# Patient Record
Sex: Male | Born: 1960
Health system: Southern US, Community
[De-identification: ages and names within clinical notes are randomized; demographics above are authoritative.]

## PROBLEM LIST (undated history)

## (undated) DIAGNOSIS — E119 Type 2 diabetes mellitus without complications: Secondary | ICD-10-CM

## (undated) DIAGNOSIS — M549 Dorsalgia, unspecified: Secondary | ICD-10-CM

## (undated) DIAGNOSIS — Z9889 Other specified postprocedural states: Secondary | ICD-10-CM

## (undated) DIAGNOSIS — E7800 Pure hypercholesterolemia, unspecified: Secondary | ICD-10-CM

## (undated) HISTORY — DX: Pure hypercholesterolemia, unspecified: E78.00

## (undated) HISTORY — PX: HERNIA REPAIR: SHX51

---
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
## 2006-05-14 ENCOUNTER — Ambulatory Visit (HOSPITAL_COMMUNITY): Admission: RE | Admit: 2006-05-14 | Discharge: 2006-05-14 | Payer: Self-pay | Admitting: Internal Medicine

## 2006-05-14 IMAGING — CR DG CHEST 1V
1 series · 1 of 1 positions shown · non-contrast
Comparison: None.

CLINICAL DATA: Biannual physical/no chest complaints.
 CHEST - 1 VIEW:

[w chest pa]
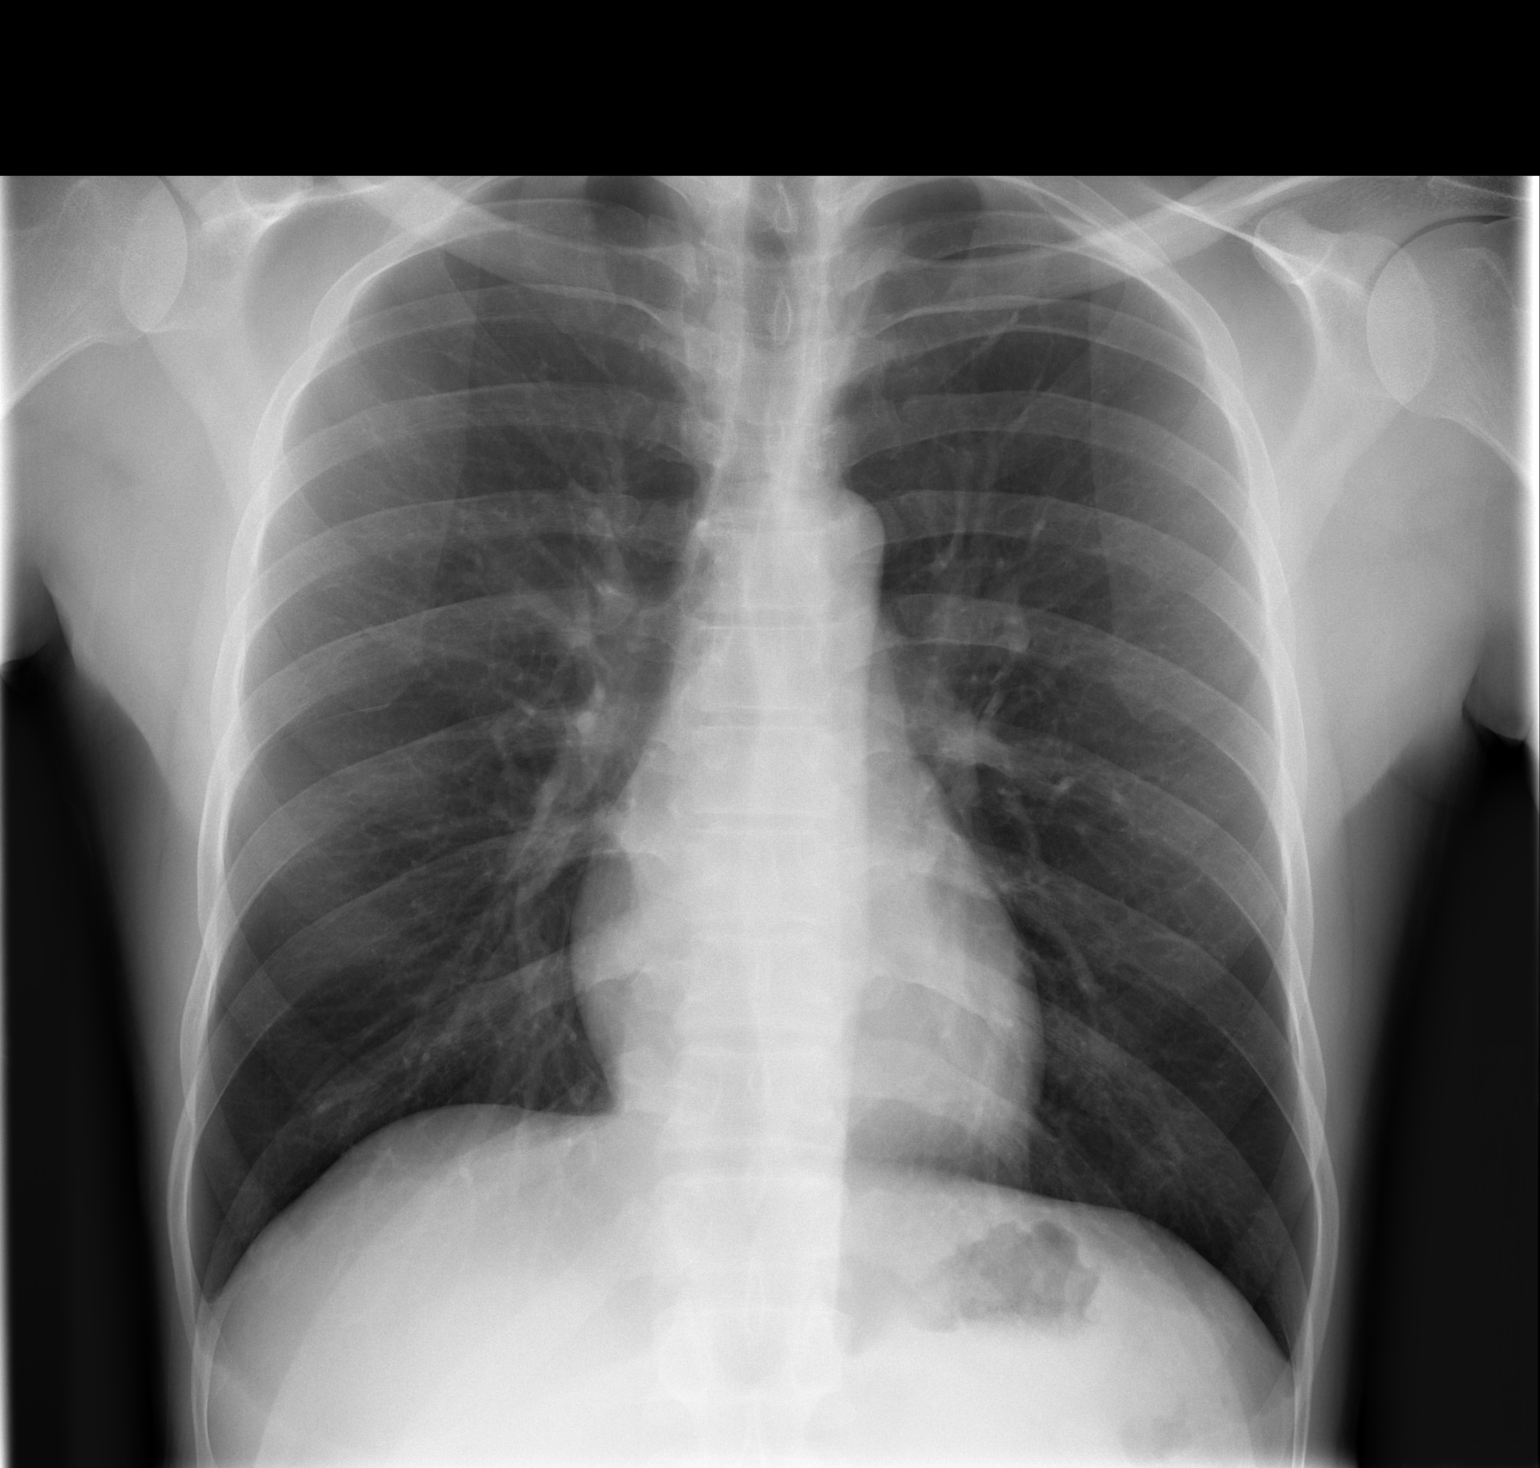

[1 of 1 positions shown; findings below may reference images not displayed]

FINDINGS: The heart and lungs are normal.  No pleural fluid or osseous lesions in 1 view.
IMPRESSION: No active disease.

## 2010-09-20 ENCOUNTER — Other Ambulatory Visit: Payer: Self-pay | Admitting: Occupational Medicine

## 2010-09-20 ENCOUNTER — Ambulatory Visit: Payer: Self-pay

## 2010-09-20 DIAGNOSIS — Z021 Encounter for pre-employment examination: Secondary | ICD-10-CM

## 2010-09-20 IMAGING — CR DG CHEST 1V
1 series · 1 of 1 positions shown · non-contrast
Comparison: [DATE]

CLINICAL DATA: Pre-employment physical examination.

CHEST - 1 VIEW

[view not recorded]
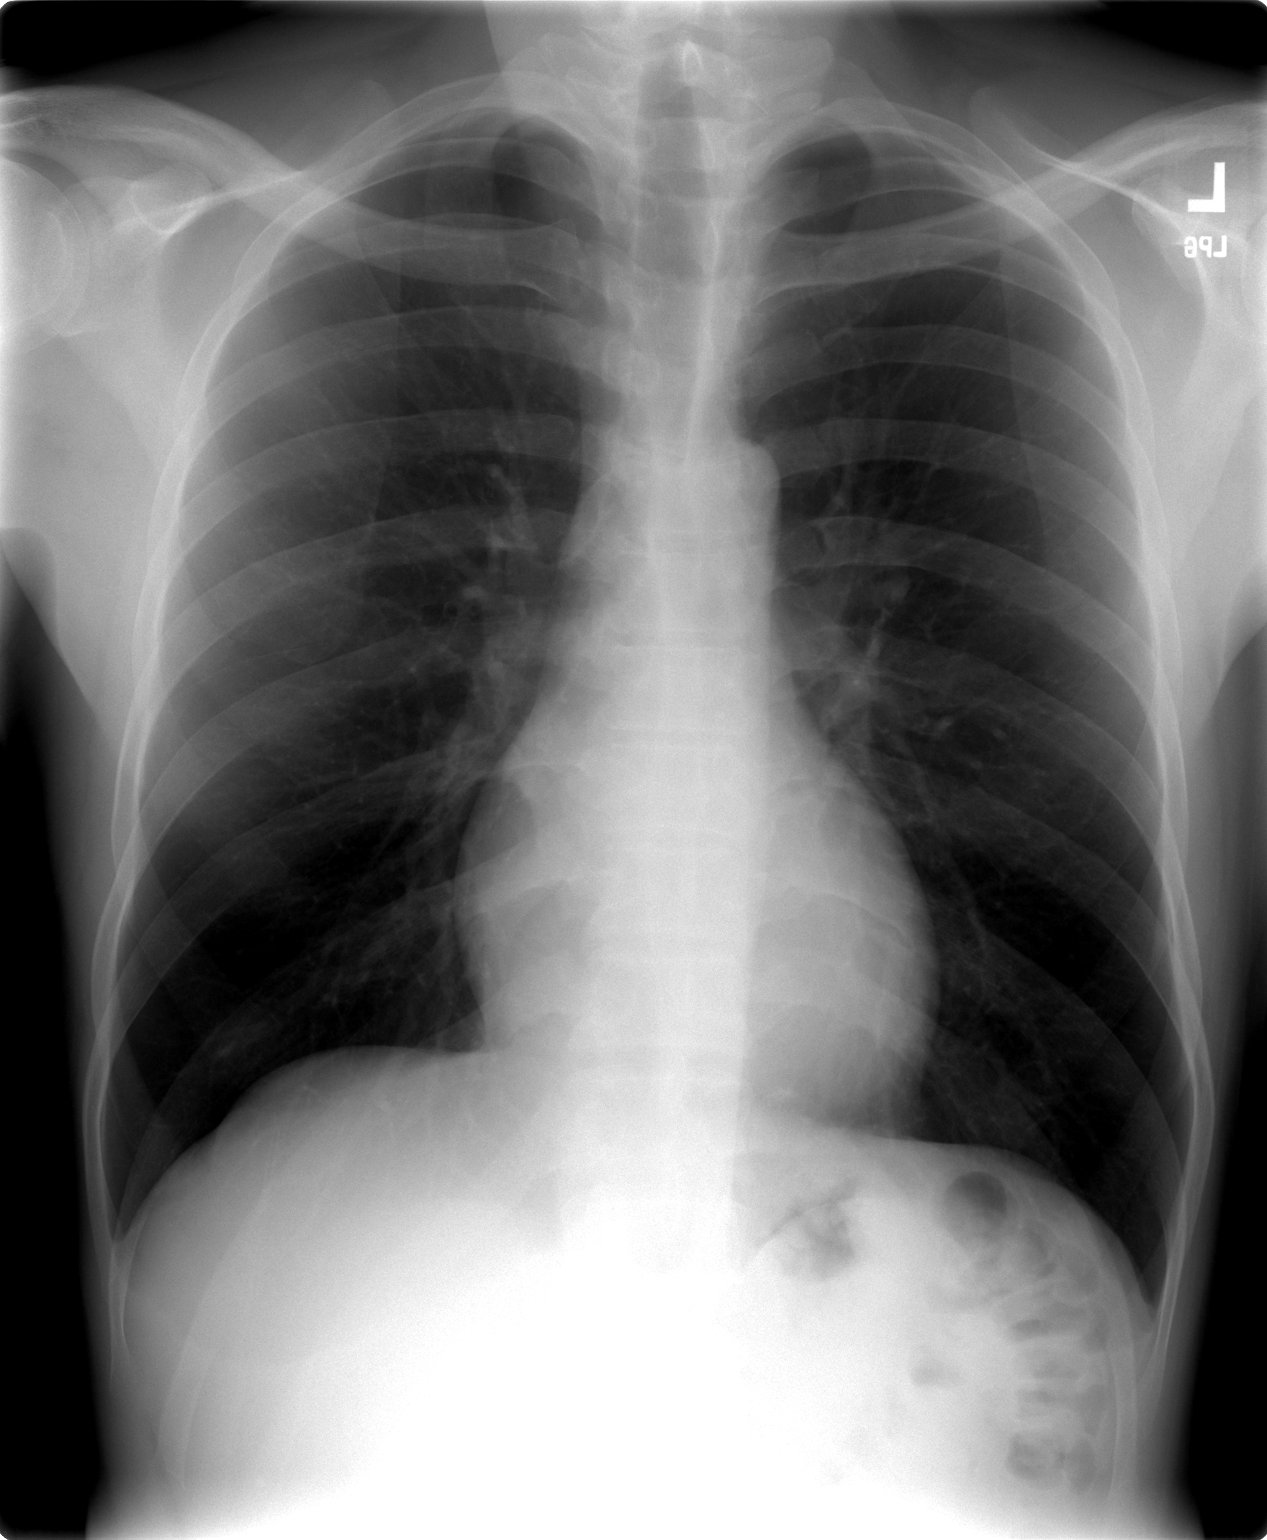

[1 of 1 positions shown; findings below may reference images not displayed]

FINDINGS: Heart size and vascularity are normal and the lungs are
clear.  No osseous abnormality.
IMPRESSION: Normal chest.

## 2012-10-29 ENCOUNTER — Ambulatory Visit (INDEPENDENT_AMBULATORY_CARE_PROVIDER_SITE_OTHER): Payer: BC Managed Care – PPO | Admitting: Family Medicine

## 2012-10-29 ENCOUNTER — Encounter: Payer: Self-pay | Admitting: Family Medicine

## 2012-10-29 VITALS — BP 122/78 | HR 76 | Ht 72.0 in | Wt 136.6 lb

## 2012-10-29 DIAGNOSIS — M549 Dorsalgia, unspecified: Secondary | ICD-10-CM

## 2012-10-29 DIAGNOSIS — G8929 Other chronic pain: Secondary | ICD-10-CM

## 2012-10-29 MED ORDER — NAPROXEN 500 MG PO TABS: 500.0000 mg | ORAL_TABLET | Freq: Two times a day (BID) | ORAL | Status: DC

## 2012-10-29 MED ORDER — HYDROCODONE-ACETAMINOPHEN 5-325 MG PO TABS: 1.0000 | ORAL_TABLET | Freq: Four times a day (QID) | ORAL | Status: DC | PRN

## 2012-10-29 MED ORDER — CHLORZOXAZONE 500 MG PO TABS: 500.0000 mg | ORAL_TABLET | Freq: Four times a day (QID) | ORAL | Status: DC | PRN

## 2012-10-29 NOTE — Progress Notes (Signed)
  Subjective:    Patient ID: Daniel Burton, male    DOB: 1960-10-22, 52 y.o.   MRN: [id number]  Back Pain This is a recurrent problem. The current episode started more than 1 year ago. The problem occurs intermittently. The quality of the pain is described as shooting. The pain radiates to the right thigh and left thigh. He has tried bed rest and heat for the symptoms. The treatment provided mild relief.      Review of Systems  Musculoskeletal: Positive for back pain.   back pain is mainly in the lower back does not radiate down the legs he states the pain is so severe in his back that grabs him causing his legs the weekend this is been going on ever since today when he picked up a heavy canister at work. He states he's had some intermittent low back pain for months. This is different than his current pain no loss of bowel or bladder control.     Objective:   Physical Exam Lungs are clear hearts regular low back moderate tenderness negative straight leg raise somewhat limited flexion but otherwise good range of motion       Assessment & Plan:  Lumbar pain-I. feel that more than likely he strained a ligament this should gradually get better with stretching as well as anti-inflammatories muscle relaxers as needed for spasms plus short-term for pain medication.

## 2012-10-29 NOTE — Patient Instructions (Addendum)
Naprosyn is a anti-inflamatory , use this twice a day for next 2 to 3 weeks  Hydrocodone for pain but only for home use for short time use  Parafon forte one as needed for muscle spasms for home use  Back Exercises Back exercises help treat and prevent back injuries. The goal of back exercises is to increase the strength of your abdominal and back muscles and the flexibility of your back. These exercises should be started when you no longer have back pain. Back exercises include:  Pelvic Tilt. Lie on your back with your knees bent. Tilt your pelvis until the lower part of your back is against the floor. Hold this position 5 to 10 sec and repeat 5 to 10 times.  Knee to Chest. Pull first 1 knee up against your chest and hold for 20 to 30 seconds, repeat this with the other knee, and then both knees. This may be done with the other leg straight or bent, whichever feels better.  Sit-Ups or Curl-Ups. Bend your knees 90 degrees. Start with tilting your pelvis, and do a partial, slow sit-up, lifting your trunk only 30 to 45 degrees off the floor. Take at least 2 to 3 seconds for each sit-up. Do not do sit-ups with your knees out straight. If partial sit-ups are difficult, simply do the above but with only tightening your abdominal muscles and holding it as directed.  Hip-Lift. Lie on your back with your knees flexed 90 degrees. Push down with your feet and shoulders as you raise your hips a couple inches off the floor; hold for 10 seconds, repeat 5 to 10 times.  Back arches. Lie on your stomach, propping yourself up on bent elbows. Slowly press on your hands, causing an arch in your low back. Repeat 3 to 5 times. Any initial stiffness and discomfort should lessen with repetition over time.  Shoulder-Lifts. Lie face down with arms beside your body. Keep hips and torso pressed to floor as you slowly lift your head and shoulders off the floor. Do not overdo your exercises, especially in the beginning.  Exercises may cause you some mild back discomfort which lasts for a few minutes; however, if the pain is more severe, or lasts for more than 15 minutes, do not continue exercises until you see your caregiver. Improvement with exercise therapy for back problems is slow.  See your caregivers for assistance with developing a proper back exercise program. Document Released: 04/20/2004 Document Revised: 06/05/2011 Document Reviewed: 01/12/2011 [facility] Patient Information 2014 [place], [place].

## 2012-10-30 ENCOUNTER — Telehealth: Payer: Self-pay | Admitting: Family Medicine

## 2012-10-30 NOTE — Progress Notes (Signed)
RX called in on voicemail 

## 2012-10-30 NOTE — Telephone Encounter (Signed)
Patient needs his muscle relaxer called in that did not go through yesterday    [facility] [place]

## 2012-10-30 NOTE — Telephone Encounter (Signed)
Rx was called into the pharmacy. Patient notified.

## 2012-11-04 ENCOUNTER — Telehealth: Payer: Self-pay | Admitting: Family Medicine

## 2012-11-04 ENCOUNTER — Encounter: Payer: Self-pay | Admitting: Family Medicine

## 2012-11-04 NOTE — Telephone Encounter (Signed)
Ok will ntbs again if persists into next wk

## 2012-11-04 NOTE — Telephone Encounter (Signed)
Patient needs another work note for this week.He was seen on 8/5 with backpain. He states unable to work because still in pain.

## 2012-11-08 ENCOUNTER — Ambulatory Visit: Payer: BC Managed Care – PPO | Admitting: Family Medicine

## 2012-11-11 ENCOUNTER — Ambulatory Visit: Payer: BC Managed Care – PPO | Admitting: Family Medicine

## 2012-11-11 ENCOUNTER — Telehealth: Payer: Self-pay | Admitting: Family Medicine

## 2012-11-11 MED ORDER — HYDROCODONE-ACETAMINOPHEN 5-325 MG PO TABS: 1.0000 | ORAL_TABLET | Freq: Four times a day (QID) | ORAL | Status: DC | PRN

## 2012-11-11 NOTE — Telephone Encounter (Signed)
Last filled 10/29/12 for #30

## 2012-11-11 NOTE — Telephone Encounter (Signed)
He may have one refill, if needing further pain medication he needs to come back for evaluation.

## 2012-11-11 NOTE — Telephone Encounter (Signed)
Patient would like a refill of his hydrocodone for back pain-He had appt today but cancelled it due to work

## 2012-11-11 NOTE — Telephone Encounter (Signed)
Rx printed and faxed to [facility] [place] . Patient notified that he will need office visit before ant further refills.

## 2012-11-15 ENCOUNTER — Ambulatory Visit (INDEPENDENT_AMBULATORY_CARE_PROVIDER_SITE_OTHER): Payer: BC Managed Care – PPO | Admitting: Family Medicine

## 2012-11-15 ENCOUNTER — Encounter: Payer: Self-pay | Admitting: Family Medicine

## 2012-11-15 VITALS — BP 124/84 | Ht 72.0 in | Wt 145.0 lb

## 2012-11-15 DIAGNOSIS — M549 Dorsalgia, unspecified: Secondary | ICD-10-CM

## 2012-11-15 NOTE — Progress Notes (Signed)
  Subjective:    Patient ID: Daniel Burton, male    DOB: Dec 27, 1960, 52 y.o.   MRN: [id number]  HPI Comments: Needs a refill on pain meds. Back pain worst this Saturday.   Back Pain This is a chronic problem. The problem has been gradually worsening since onset. The pain is the same all the time. Associated symptoms include weight loss. He has tried NSAIDs and muscle relaxant for the symptoms. The treatment provided mild relief.   Pain got worse this past weekend, not feeling one hundred per cent  On light duty occasionally having discomfort intermittently.  Medication ran out and he feels like he needs more.     Review of Systems  Constitutional: Positive for weight loss.  Musculoskeletal: Positive for back pain.   ROS otherwise negative     Objective:   Physical Exam  Alert no major distress except when moving. Lungs clear. Heart regular rate and rhythm. Left lower lumbar region tender to palpation. Negative straight leg raise      Assessment & Plan:  Impression persistent lumbar strain. Plan antispasm medicine pain medication and anti-inflammatory medicine represcribed. Local measures discussed. Another week of light duty expect gradual resolution. Screening physicals still encouraged

## 2012-11-15 NOTE — Patient Instructions (Signed)
This is a strain of low back muscles and ligaments  This should slowly improve and get back to baseline  In the meantime, ( next two weeks), light duty would be helpful in his recovery.       Lumbosacral Strain Lumbosacral strain is one of the most common causes of back pain. There are many causes of back pain. Most are not serious conditions. CAUSES  Your backbone (spinal column) is made up of 24 main vertebral bodies, the sacrum, and the coccyx. These are held together by muscles and tough, fibrous tissue (ligaments). Nerve roots pass through the openings between the vertebrae. A sudden move or injury to the back may cause injury to, or pressure on, these nerves. This may result in localized back pain or pain movement (radiation) into the buttocks, down the leg, and into the foot. Sharp, shooting pain from the buttock down the back of the leg (sciatica) is frequently associated with a ruptured (herniated) disk. Pain may be caused by muscle spasm alone. Your caregiver can often find the cause of your pain by the details of your symptoms and an exam. In some cases, you may need tests (such as X-rays). Your caregiver will work with you to decide if any tests are needed based on your specific exam. HOME CARE INSTRUCTIONS   Avoid an underactive lifestyle. Active exercise, as directed by your caregiver, is your greatest weapon against back pain.  Avoid hard physical activities (tennis, racquetball, waterskiing) if you are not in proper physical condition for it. This may aggravate or create problems.  If you have a back problem, avoid sports requiring sudden body movements. Swimming and walking are generally safer activities.  Maintain good posture.  Avoid becoming overweight (obese).  Use bed rest for only the most extreme, sudden (acute) episode. Your caregiver will help you determine how much bed rest is necessary.  For acute conditions, you may put ice on the injured area.  Put ice in  a plastic bag.  Place a towel between your skin and the bag.  Leave the ice on for 15-20 minutes at a time, every 2 hours, or as needed.  After you are improved and more active, it may help to apply heat for 30 minutes before activities. See your caregiver if you are having pain that lasts longer than expected. Your caregiver can advise appropriate exercises or therapy if needed. With conditioning, most back problems can be avoided. SEEK IMMEDIATE MEDICAL CARE IF:   You have numbness, tingling, weakness, or problems with the use of your arms or legs.  You experience severe back pain not relieved with medicines.  There is a change in bowel or bladder control.  You have increasing pain in any area of the body, including your belly (abdomen).  You notice shortness of breath, dizziness, or feel faint.  You feel sick to your stomach (nauseous), are throwing up (vomiting), or become sweaty.  You notice discoloration of your toes or legs, or your feet get very cold.  Your back pain is getting worse.  You have a fever. MAKE SURE YOU:   Understand these instructions.  Will watch your condition.  Will get help right away if you are not doing well or get worse. Document Released: 12/21/2004 Document Revised: 06/05/2011 Document Reviewed: 06/12/2008 [facility] Patient Information 2014 [place], [place]. Back Exercises Back exercises help treat and prevent back injuries. The goal of back exercises is to increase the strength of your abdominal and back muscles and the flexibility of  your back. These exercises should be started when you no longer have back pain. Back exercises include:  Pelvic Tilt. Lie on your back with your knees bent. Tilt your pelvis until the lower part of your back is against the floor. Hold this position 5 to 10 sec and repeat 5 to 10 times.  Knee to Chest. Pull first 1 knee up against your chest and hold for 20 to 30 seconds, repeat this with the other knee, and then  both knees. This may be done with the other leg straight or bent, whichever feels better.  Sit-Ups or Curl-Ups. Bend your knees 90 degrees. Start with tilting your pelvis, and do a partial, slow sit-up, lifting your trunk only 30 to 45 degrees off the floor. Take at least 2 to 3 seconds for each sit-up. Do not do sit-ups with your knees out straight. If partial sit-ups are difficult, simply do the above but with only tightening your abdominal muscles and holding it as directed.  Hip-Lift. Lie on your back with your knees flexed 90 degrees. Push down with your feet and shoulders as you raise your hips a couple inches off the floor; hold for 10 seconds, repeat 5 to 10 times.  Back arches. Lie on your stomach, propping yourself up on bent elbows. Slowly press on your hands, causing an arch in your low back. Repeat 3 to 5 times. Any initial stiffness and discomfort should lessen with repetition over time.  Shoulder-Lifts. Lie face down with arms beside your body. Keep hips and torso pressed to floor as you slowly lift your head and shoulders off the floor. Do not overdo your exercises, especially in the beginning. Exercises may cause you some mild back discomfort which lasts for a few minutes; however, if the pain is more severe, or lasts for more than 15 minutes, do not continue exercises until you see your caregiver. Improvement with exercise therapy for back problems is slow.  See your caregivers for assistance with developing a proper back exercise program. Document Released: 04/20/2004 Document Revised: 06/05/2011 Document Reviewed: 01/12/2011 [facility] Patient Information 2014 [place], [place].

## 2012-11-21 ENCOUNTER — Encounter (HOSPITAL_COMMUNITY): Payer: Self-pay | Admitting: Emergency Medicine

## 2012-11-21 ENCOUNTER — Emergency Department (HOSPITAL_COMMUNITY): Payer: BC Managed Care – PPO

## 2012-11-21 ENCOUNTER — Emergency Department (HOSPITAL_COMMUNITY)
Admission: EM | Admit: 2012-11-21 | Discharge: 2012-11-21 | Disposition: A | Discharge status: Home / Self Care | Payer: BC Managed Care – PPO | Attending: Emergency Medicine | Admitting: Emergency Medicine

## 2012-11-21 DIAGNOSIS — Y939 Activity, unspecified: Secondary | ICD-10-CM | POA: Insufficient documentation

## 2012-11-21 DIAGNOSIS — Z791 Long term (current) use of non-steroidal anti-inflammatories (NSAID): Secondary | ICD-10-CM | POA: Insufficient documentation

## 2012-11-21 DIAGNOSIS — Y929 Unspecified place or not applicable: Secondary | ICD-10-CM | POA: Insufficient documentation

## 2012-11-21 DIAGNOSIS — X500XXA Overexertion from strenuous movement or load, initial encounter: Secondary | ICD-10-CM | POA: Insufficient documentation

## 2012-11-21 DIAGNOSIS — S39012A Strain of muscle, fascia and tendon of lower back, initial encounter: Secondary | ICD-10-CM

## 2012-11-21 DIAGNOSIS — S335XXA Sprain of ligaments of lumbar spine, initial encounter: Secondary | ICD-10-CM | POA: Insufficient documentation

## 2012-11-21 DIAGNOSIS — F17200 Nicotine dependence, unspecified, uncomplicated: Secondary | ICD-10-CM | POA: Insufficient documentation

## 2012-11-21 IMAGING — CR DG LUMBAR SPINE COMPLETE 4+V
5 series · 5 of 5 positions shown · non-contrast
Comparison: None.

CLINICAL DATA: The patient states he hurt his back 3 weeks ago at
work.  Low back pain.

LUMBAR SPINE - COMPLETE 4+ VIEW

[view not recorded (1 of 5)]
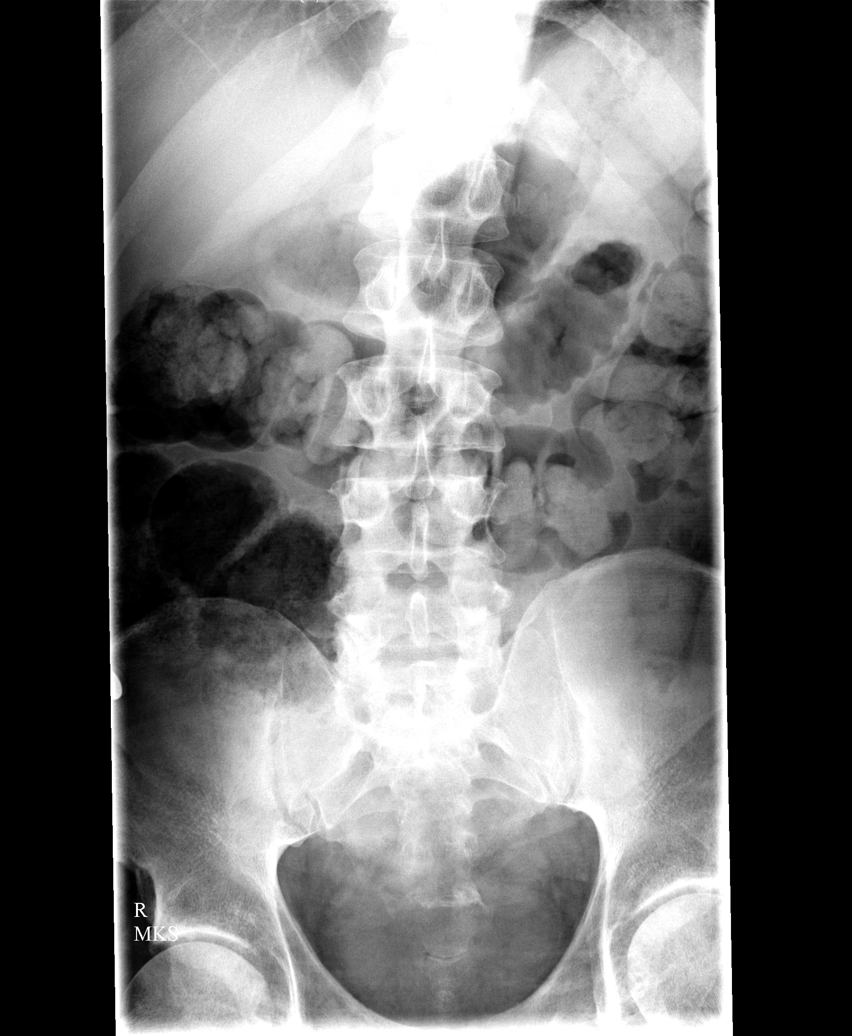

[view not recorded (2 of 5)]
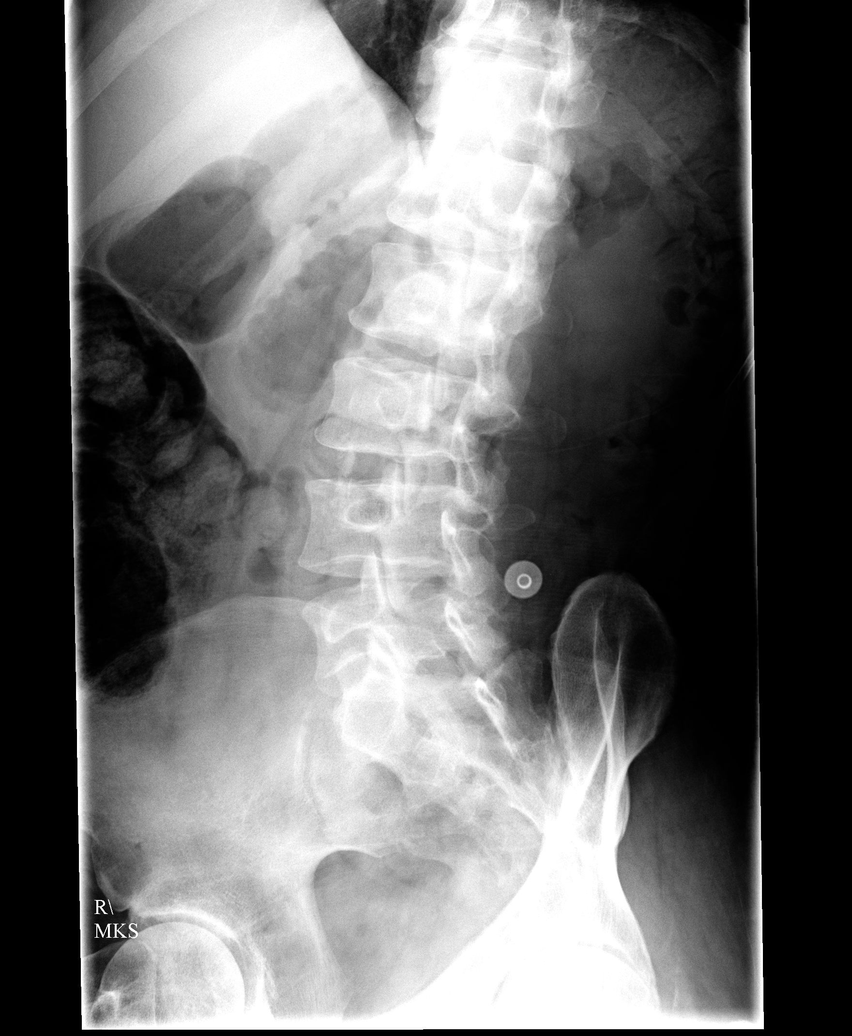

[view not recorded (3 of 5)]
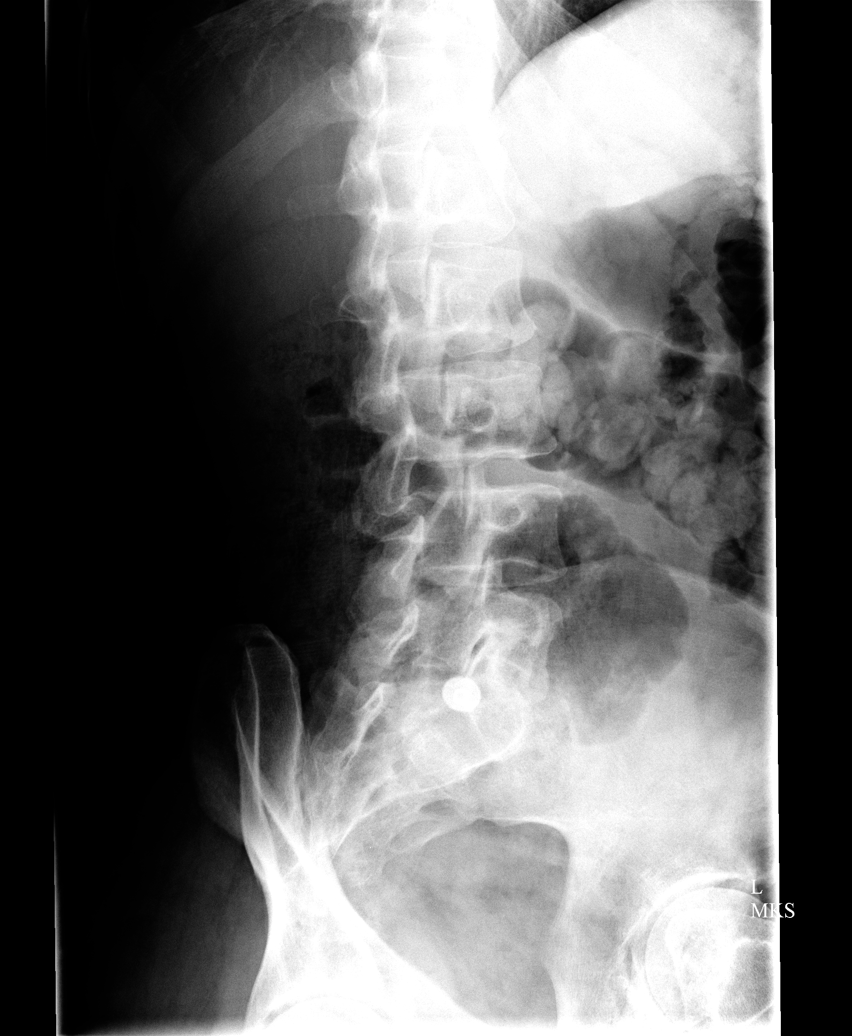

[view not recorded (4 of 5)]
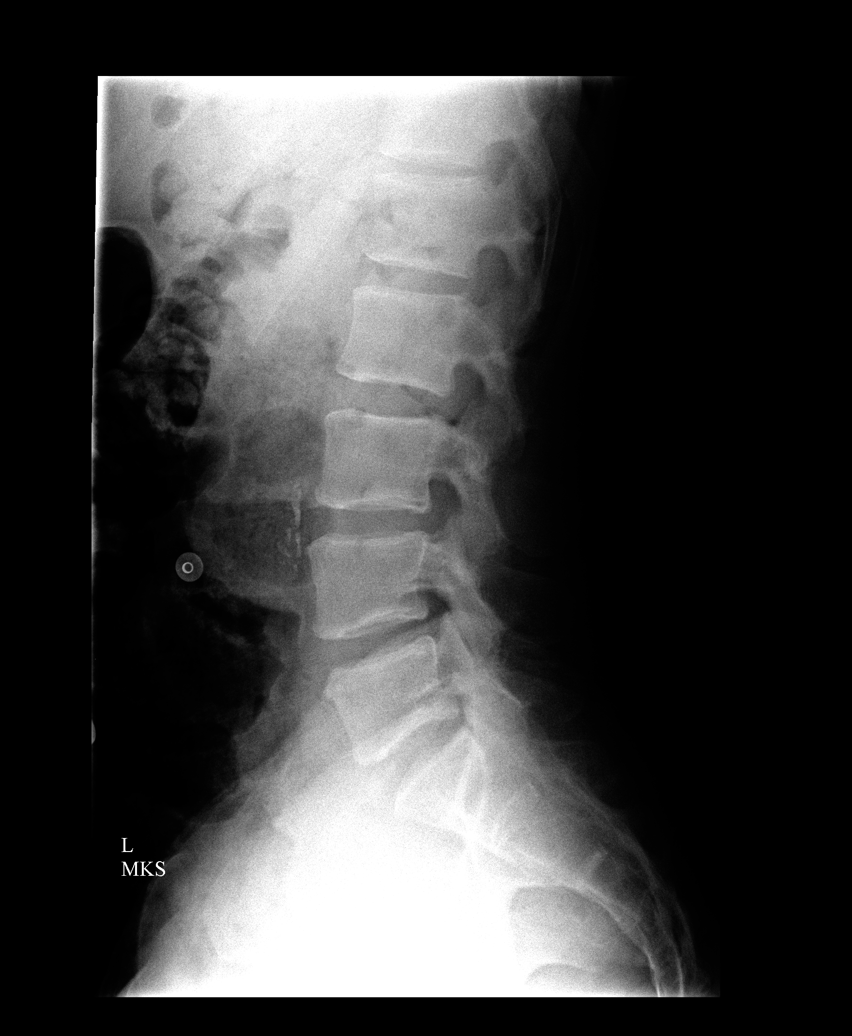

[view not recorded (5 of 5)]
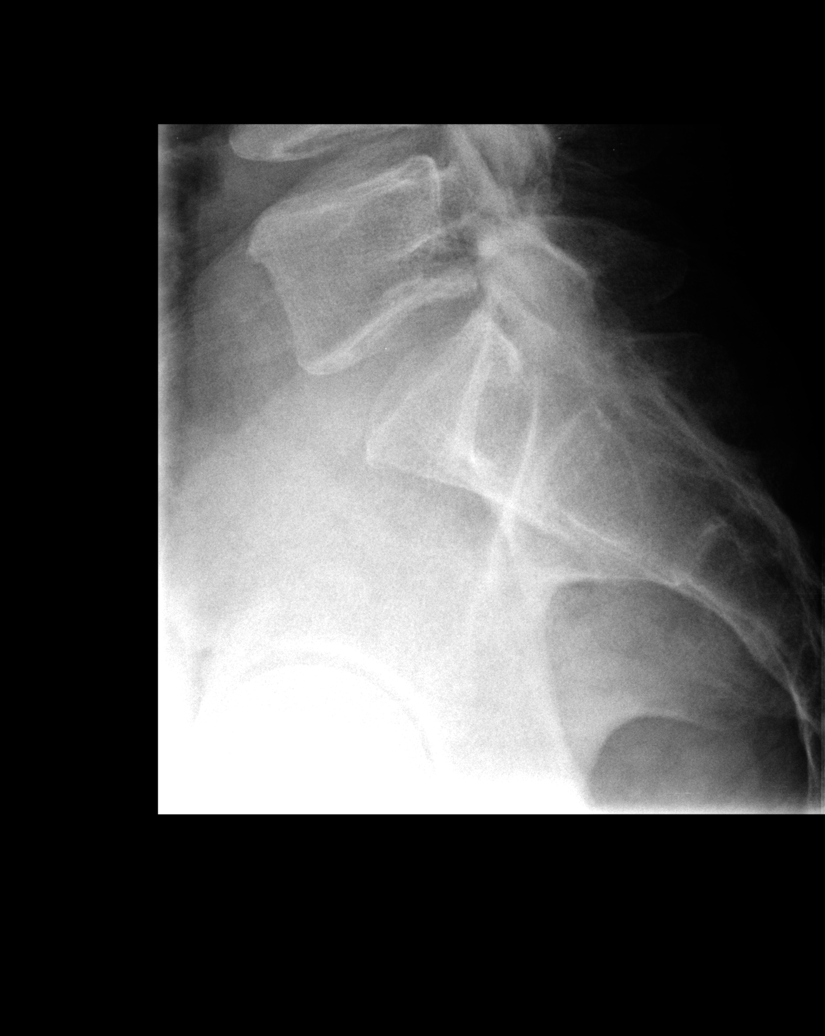

[5 of 5 positions shown; findings below may reference images not displayed]

FINDINGS: There is normal vertebral body stature alignment.  No
fracture.

The disc spaces and facet joints well preserved.

The soft tissues are unremarkable.

Minimal endplate osteophytes noted at L3, L4-L5.  No other
degenerative change.
IMPRESSION: Minimal degenerative change.  No other abnormalities.

## 2012-11-21 MED ORDER — CYCLOBENZAPRINE HCL 10 MG PO TABS: 10.0000 mg | ORAL_TABLET | Freq: Two times a day (BID) | ORAL | Status: DC | PRN

## 2012-11-21 MED ORDER — HYDROCODONE-ACETAMINOPHEN 5-325 MG PO TABS
1.0000 | ORAL_TABLET | Freq: Once | ORAL | Status: AC
  Administered 2012-11-21: 1 via ORAL
  Filled 2012-11-21: qty 1

## 2012-11-21 MED ORDER — HYDROCODONE-ACETAMINOPHEN 5-325 MG PO TABS: 1.0000 | ORAL_TABLET | Freq: Four times a day (QID) | ORAL | Status: DC | PRN

## 2012-11-21 NOTE — ED Notes (Signed)
Patient given discharge instruction, verbalized understand. IV removed, band aid applied. Patient in wheelchair out of the department to meet wife.

## 2012-11-21 NOTE — ED Notes (Signed)
Patient c/o lower back pain x3 weeks. Patient seen Dr Gerda Diss and was told patient pulled ligaments and company doctor told him pulled muscle was put on light duty. Patient at work today went to change clothes and had sudden burning pain in back. Patient unable to bear weight on left leg. Per EMS patient was in severe pain upon arrival to scene given 4mg  IV morphine by EMS. Now rates pain at a 2/10.

## 2012-11-21 NOTE — ED Provider Notes (Signed)
CSN: [id number]     Arrival date & time 11/21/12  1915 History   First MD Initiated Contact with Patient 11/21/12 1925     Chief Complaint  Patient presents with  . Back Pain   (Consider location/radiation/quality/duration/timing/severity/associated sxs/prior Treatment) Patient is a 52 y.o. male presenting with back pain. The history is provided by the patient and the EMS personnel.  Back Pain Associated symptoms: no abdominal pain, no chest pain, no dysuria, no fever, no headaches, no numbness and no weakness    patient brought in by EMS from work. Patient is getting rate to work his back suddenly created severe pain on the left lumbar area radiating down into his left thigh. Pain was quite severe EMS gave him 4 mg of IV morphine in route and pain came down to 2/10. Patient does have been having problems with his low back for 3 weeks has seen his primary care Dr. Dr. Kennis Carina. And also seen by the company doctor. Patient has not had x-rays or MRI. Patient originally injured the back at work while lifting a heavy drum. Patient denies any numbness or weakness to the feet also denies any incontinence. No history of prior back problems. Patient's states the pain now is 0/10 at its worst it was 10 out of 10 it was sharp in nature and made worse by moving.  History reviewed. No pertinent past medical history. History reviewed. No pertinent past surgical history. No family history on file. History  Substance Use Topics  . Smoking status: Light Tobacco Smoker    Types: Cigarettes  . Smokeless tobacco: Not on file  . Alcohol Use: No    Review of Systems  Constitutional: Negative for fever.  HENT: Negative for congestion.   Respiratory: Negative for shortness of breath.   Cardiovascular: Negative for chest pain.  Gastrointestinal: Negative for abdominal pain.  Genitourinary: Negative for dysuria and difficulty urinating.  Musculoskeletal: Positive for back pain.  Neurological: Negative for  weakness, numbness and headaches.  Hematological: Does not bruise/bleed easily.  Psychiatric/Behavioral: Negative for confusion.    Allergies  Review of patient's allergies indicates no known allergies.  Home Medications   Current Outpatient Rx  Name  Route  Sig  Dispense  Refill  . chlorzoxazone (PARAFON FORTE DSC) 500 MG tablet   Oral   Take 1 tablet (500 mg total) by mouth 4 (four) times daily as needed for muscle spasms.   30 tablet   0     For home use   . HYDROcodone-acetaminophen (NORCO/VICODIN) 5-325 MG per tablet   Oral   Take 1 tablet by mouth every 6 (six) hours as needed for pain.   30 tablet   0   . naproxen (NAPROSYN) 500 MG tablet   Oral   Take 1 tablet (500 mg total) by mouth 2 (two) times daily with a meal.   60 tablet   2   . cyclobenzaprine (FLEXERIL) 10 MG tablet   Oral   Take 1 tablet (10 mg total) by mouth 2 (two) times daily as needed for muscle spasms.   20 tablet   0   . HYDROcodone-acetaminophen (NORCO/VICODIN) 5-325 MG per tablet   Oral   Take 1-2 tablets by mouth every 6 (six) hours as needed for pain.   20 tablet   0    BP 142/89  Pulse 76  Temp(Src) 98.5 F (36.9 C) (Oral)  Resp 20  SpO2 98% Physical Exam  Nursing note and vitals reviewed. Constitutional: He is  oriented to person, place, and time. He appears well-developed and well-nourished. No distress.  HENT:  Head: Normocephalic and atraumatic.  Mouth/Throat: Oropharynx is clear and moist.  Eyes: Conjunctivae and EOM are normal. Pupils are equal, round, and reactive to light.  Neck: Normal range of motion.  Cardiovascular: Normal rate, regular rhythm and normal heart sounds.   Pulmonary/Chest: Effort normal and breath sounds normal. No respiratory distress.  Abdominal: Soft. Bowel sounds are normal. There is no tenderness.  Musculoskeletal: Normal range of motion.  Back nontender to palpation. Dorsalis pedis pulse right and left 2+. No numbness no weakness.   Neurological: He is alert and oriented to person, place, and time. No cranial nerve deficit. He exhibits normal muscle tone. Coordination normal.  Skin: Skin is warm and dry.    ED Course  Procedures (including critical care time) Labs Review Labs Reviewed - No data to display Imaging Review Dg Lumbar Spine Complete  11/21/2012   *RADIOLOGY REPORT*  Clinical Data: The patient states he hurt his back 3 weeks ago at work.  Low back pain.  LUMBAR SPINE - COMPLETE 4+ VIEW  Comparison: None.  Findings: There is normal vertebral body stature alignment.  No fracture.  The disc spaces and facet joints well preserved.  The soft tissues are unremarkable.  Minimal endplate osteophytes noted at L3, L4-L5.  No other degenerative change.  IMPRESSION: Minimal degenerative change.  No other abnormalities.   Original Report Authenticated By: Amie Portland, M.D.    MDM   1. Lumbar strain, initial encounter    X-rays of the back without any acute findings. There is some degenerative changes in the lower lumbar area that may be attributable to the patient's pain patient injured his back while on the job. Followup with spine specialist here locally provided. Next B. MRI for patient. Patient has pain medication at home was given a supplemental prescription for that as he needs it. And a work note provided.  Patient here today has no evidence of sciatica in no acute focal neuro deficits.  Shelda Jakes, MD 11/21/12 [phone]

## 2012-11-28 ENCOUNTER — Encounter: Payer: Self-pay | Admitting: Family Medicine

## 2012-11-28 ENCOUNTER — Ambulatory Visit (INDEPENDENT_AMBULATORY_CARE_PROVIDER_SITE_OTHER): Payer: BC Managed Care – PPO | Admitting: Family Medicine

## 2012-11-28 VITALS — BP 110/74 | Ht 72.0 in | Wt 143.0 lb

## 2012-11-28 DIAGNOSIS — M549 Dorsalgia, unspecified: Secondary | ICD-10-CM

## 2012-11-28 DIAGNOSIS — G8929 Other chronic pain: Secondary | ICD-10-CM

## 2012-11-28 NOTE — Patient Instructions (Addendum)
Back Exercises Back exercises help treat and prevent back injuries. The goal of back exercises is to increase the strength of your abdominal and back muscles and the flexibility of your back. These exercises should be started when you no longer have back pain. Back exercises include:  Pelvic Tilt. Lie on your back with your knees bent. Tilt your pelvis until the lower part of your back is against the floor. Hold this position 5 to 10 sec and repeat 5 to 10 times.  Knee to Chest. Pull first 1 knee up against your chest and hold for 20 to 30 seconds, repeat this with the other knee, and then both knees. This may be done with the other leg straight or bent, whichever feels better.  Sit-Ups or Curl-Ups. Bend your knees 90 degrees. Start with tilting your pelvis, and do a partial, slow sit-up, lifting your trunk only 30 to 45 degrees off the floor. Take at least 2 to 3 seconds for each sit-up. Do not do sit-ups with your knees out straight. If partial sit-ups are difficult, simply do the above but with only tightening your abdominal muscles and holding it as directed.  Hip-Lift. Lie on your back with your knees flexed 90 degrees. Push down with your feet and shoulders as you raise your hips a couple inches off the floor; hold for 10 seconds, repeat 5 to 10 times.  Back arches. Lie on your stomach, propping yourself up on bent elbows. Slowly press on your hands, causing an arch in your low back. Repeat 3 to 5 times. Any initial stiffness and discomfort should lessen with repetition over time.  Shoulder-Lifts. Lie face down with arms beside your body. Keep hips and torso pressed to floor as you slowly lift your head and shoulders off the floor. Do not overdo your exercises, especially in the beginning. Exercises may cause you some mild back discomfort which lasts for a few minutes; however, if the pain is more severe, or lasts for more than 15 minutes, do not continue exercises until you see your caregiver.  Improvement with exercise therapy for back problems is slow.  See your caregivers for assistance with developing a proper back exercise program. Document Released: 04/20/2004 Document Revised: 06/05/2011 Document Reviewed: 01/12/2011 [employer] Patient Information 2014 [employer].  

## 2012-11-28 NOTE — Progress Notes (Signed)
  Subjective:    Patient ID: Daniel Burton, male    DOB: 1960/05/05, 52 y.o.   MRN: [id number]  HPIFollow up on back pain. Went to [facility] last Thursday for back pain. And patient actually had to go to the emergency room via EMS. They did x-rays there. It showed degenerative changes. The pain was primarily in the left lower lumbar region.  The patient range see been injection. Stated that this improved his pain considerably.   Patient notes some numbness in his left side. Comes and goes. No weakness.  Currently not working due to the pain.   Review of Systems No change in urinary or bowel habits no weakness no abdominal pain.    Objective:   Physical Exam  Alert no acute distress. Lungs clear. Heart regular in rhythm. Left lower lumbar region tender to palpation. Negative straight leg raise pulses sensation strength all intact.      Assessment & Plan:  Impression severe lumbar strain discussed at length. Patient wonders whether he may need surgery. I do not think so. He wonders if he needs a scan I also do not think so. Incredibly we spent probably 7 or 8 minutes talking just about his work excuse alone. At first he stated that he wanted to go back to work sooner than I recommended. Then he stated he thought he needed to be out longer. We finally decided on writing him out of work through next week in. Remarkably 25 minutes spent in this evaluation mostly in just an attempt at communication.

## 2013-01-20 ENCOUNTER — Telehealth: Payer: Self-pay | Admitting: Family Medicine

## 2013-01-20 NOTE — Telephone Encounter (Signed)
Patient states He did not pick HYDROcodone-acetaminophen (NORCO/VICODIN) 5-325 MG per tablet up before 7 days therefore now he needs a new prescription.  Also would like a print of what to eat and not to eat for high chol

## 2013-01-20 NOTE — Telephone Encounter (Signed)
Sorry, what does this mean???

## 2013-01-20 NOTE — Telephone Encounter (Signed)
Last office visit 11-28-12

## 2013-01-21 MED ORDER — HYDROCODONE-ACETAMINOPHEN 5-325 MG PO TABS: 1.0000 | ORAL_TABLET | Freq: Four times a day (QID) | ORAL | Status: DC | PRN

## 2013-01-21 NOTE — Telephone Encounter (Signed)
Ok let's do again plus give low chol diet info.

## 2013-01-21 NOTE — Telephone Encounter (Signed)
Rx printed and left up front with cholesterol info for patient pick up. Patient notified.

## 2013-01-21 NOTE — Telephone Encounter (Signed)
  Patient states that the last rx you gave him he turned into pharmacy but didn't go to pick it up for over a week. When he went to pick it up the pharmacy told him since it is a class 2 narc after 7 days they shred the rx and he would need a new one to get med

## 2015-06-04 ENCOUNTER — Encounter: Payer: Self-pay | Admitting: Family Medicine

## 2015-06-04 ENCOUNTER — Ambulatory Visit (INDEPENDENT_AMBULATORY_CARE_PROVIDER_SITE_OTHER): Payer: BLUE CROSS/BLUE SHIELD | Admitting: Family Medicine

## 2015-06-04 VITALS — BP 120/80 | Temp 99.6°F | Ht 72.0 in | Wt 133.1 lb

## 2015-06-04 DIAGNOSIS — J111 Influenza due to unidentified influenza virus with other respiratory manifestations: Secondary | ICD-10-CM | POA: Diagnosis not present

## 2015-06-04 MED ORDER — OSELTAMIVIR PHOSPHATE 75 MG PO CAPS: 75.0000 mg | ORAL_CAPSULE | Freq: Two times a day (BID) | ORAL | Status: DC

## 2015-06-04 MED ORDER — HYDROCODONE-HOMATROPINE 5-1.5 MG/5ML PO SYRP: 5.0000 mL | ORAL_SOLUTION | Freq: Four times a day (QID) | ORAL | Status: DC | PRN

## 2015-06-04 NOTE — Patient Instructions (Signed)

## 2015-06-04 NOTE — Progress Notes (Signed)
   Subjective:    Patient ID: Daniel Burton, male    DOB: 1961/01/15, 55 y.o.   MRN: [id number]  Fever  This is a new problem. The current episode started yesterday. The problem occurs intermittently. The problem has been unchanged. The maximum temperature noted was 99 to 99.9 F. Associated symptoms include congestion, coughing, muscle aches and a sore throat. Pertinent negatives include no chest pain, ear pain or wheezing. Associated symptoms comments: weak. He has tried nothing for the symptoms. The treatment provided no relief.    onset over the past 2436 hrs. Fever chills sweats nausea burning in the throat coughing. Denies high fevers denies wheezing or difficulty breathing   Review of Systems  Constitutional: Positive for fever. Negative for activity change.  HENT: Positive for congestion, rhinorrhea and sore throat. Negative for ear pain.   Eyes: Negative for discharge.  Respiratory: Positive for cough. Negative for wheezing.   Cardiovascular: Negative for chest pain.       Objective:   Physical Exam  Constitutional: He appears well-developed.  HENT:  Head: Normocephalic.  Mouth/Throat: Oropharynx is clear and moist. No oropharyngeal exudate.  Neck: Normal range of motion.  Cardiovascular: Normal rate, regular rhythm and normal heart sounds.   No murmur heard. Pulmonary/Chest: Effort normal and breath sounds normal. He has no wheezes.  Lymphadenopathy:    He has no cervical adenopathy.  Neurological: He exhibits normal muscle tone.  Skin: Skin is warm and dry.  Nursing note and vitals reviewed.         Assessment & Plan:  Influenza-the patient was diagnosed with influenza. Patient/family educated about the flu and warning signs to watch for. If difficulty breathing, severe neck pain and stiffness, cyanosis, disorientation, or progressive worsening then immediately get rechecked at that ER. If progressive symptoms be certain to be rechecked. Supportive measures such  as Tylenol/ibuprofen was discussed. No aspirin use in children. And influenza home care instruction sheet was given.

## 2015-11-01 ENCOUNTER — Encounter: Payer: Self-pay | Admitting: Family Medicine

## 2015-11-01 ENCOUNTER — Ambulatory Visit (INDEPENDENT_AMBULATORY_CARE_PROVIDER_SITE_OTHER): Payer: BLUE CROSS/BLUE SHIELD | Admitting: Family Medicine

## 2015-11-01 VITALS — Ht 72.0 in | Wt 136.4 lb

## 2015-11-01 DIAGNOSIS — H00013 Hordeolum externum right eye, unspecified eyelid: Secondary | ICD-10-CM

## 2015-11-01 DIAGNOSIS — Z125 Encounter for screening for malignant neoplasm of prostate: Secondary | ICD-10-CM | POA: Diagnosis not present

## 2015-11-01 DIAGNOSIS — Z131 Encounter for screening for diabetes mellitus: Secondary | ICD-10-CM | POA: Diagnosis not present

## 2015-11-01 DIAGNOSIS — Z13220 Encounter for screening for lipoid disorders: Secondary | ICD-10-CM

## 2015-11-01 MED ORDER — TRIAMCINOLONE ACETONIDE 0.1 % EX CREA: 1.0000 "application " | TOPICAL_CREAM | Freq: Two times a day (BID) | CUTANEOUS | 4 refills | Status: DC

## 2015-11-01 MED ORDER — CEPHALEXIN 500 MG PO CAPS: 500.0000 mg | ORAL_CAPSULE | Freq: Four times a day (QID) | ORAL | 0 refills | Status: DC

## 2015-11-01 NOTE — Progress Notes (Signed)
   Subjective:    Patient ID: Daniel Burton, male    DOB: Feb 12, 1961, 55 y.o.   MRN: [id number]  HPI Patient arrives with c/o right eye swelling and itching for a few days Denies any drainage fever chills  Review of Systems Swelling on the right eye noted where the infection is    Objective:   Physical Exam stye is noted on the right eye with some tenderness and redness no infection within the       Assessment & Plan:  Stye- 3 warm compresses antibiotics and gradually get better allergy medicine when necessary follow-up if ongoing trouble  Encourage patient to do a wellness visit as well as lab work

## 2016-05-15 ENCOUNTER — Ambulatory Visit (INDEPENDENT_AMBULATORY_CARE_PROVIDER_SITE_OTHER): Payer: BLUE CROSS/BLUE SHIELD | Admitting: Nurse Practitioner

## 2016-05-15 ENCOUNTER — Encounter: Payer: Self-pay | Admitting: Nurse Practitioner

## 2016-05-15 VITALS — BP 132/92 | Temp 97.6°F | Ht 72.0 in | Wt 141.4 lb

## 2016-05-15 DIAGNOSIS — J310 Chronic rhinitis: Secondary | ICD-10-CM

## 2016-05-15 DIAGNOSIS — J329 Chronic sinusitis, unspecified: Secondary | ICD-10-CM

## 2016-05-15 MED ORDER — AMOXICILLIN-POT CLAVULANATE 875-125 MG PO TABS: 1.0000 | ORAL_TABLET | Freq: Two times a day (BID) | ORAL | 0 refills | Status: DC

## 2016-05-16 ENCOUNTER — Encounter: Payer: Self-pay | Admitting: Nurse Practitioner

## 2016-05-16 NOTE — Progress Notes (Signed)
Subjective:  Presents for c/o cough that began 4 days ago. Chest pain only with cough at times. No SOB or wheezing. Chest discomfort better when he eats sweet and sour soup. Sore throat. No fever or headache. Mild fatigue. No missed work. Frequent cough producing slight amount of yellow mucus.   Objective:   BP (!) 132/92   Temp 97.6 F (36.4 C) (Oral)   Ht 6' (1.829 m)   Wt 141 lb 6 oz (64.1 kg)   BMI 19.17 kg/m  NAD. Alert, oriented. TMs clear effusion. Pharynx injected with green PND noted. Neck supple with mild anterior adenopathy. Lungs clear. Heart RRR. No CP to palpation.   Assessment:  Rhinosinusitis    Plan:   Meds ordered this encounter  Medications  . amoxicillin-clavulanate (AUGMENTIN) 875-125 MG tablet    Sig: Take 1 tablet by mouth 2 (two) times daily.    Dispense:  20 tablet    Refill:  0    Order Specific Question:   Supervising Provider    Answer:   Mikey Kirschner [[id number]]   OTC meds as directed. Call back if worsens or persists.

## 2016-09-14 ENCOUNTER — Ambulatory Visit (INDEPENDENT_AMBULATORY_CARE_PROVIDER_SITE_OTHER): Payer: BLUE CROSS/BLUE SHIELD | Admitting: Nurse Practitioner

## 2016-09-14 ENCOUNTER — Encounter: Payer: Self-pay | Admitting: Nurse Practitioner

## 2016-09-14 VITALS — BP 120/80 | Ht 72.0 in | Wt 137.0 lb

## 2016-09-14 DIAGNOSIS — G5621 Lesion of ulnar nerve, right upper limb: Secondary | ICD-10-CM

## 2016-09-14 NOTE — Progress Notes (Signed)
Subjective:  Presents for complaints of tingling on the thumb side of his right hand off-and-on for the past 2 weeks. Occurs occasionally. Last up to 3 minutes. No pain or weakness of the right hand. No neck or shoulder pain. No elbow pain but patient has limited rotation of the right lower arm due to an old elbow fracture. His job requires a lot of lifting and pulling but no new activities or injury. Mainly occurs when his hand is lying on his leg in a certain position. Does not bother him at nighttime.  Objective:   BP 120/80   Ht 6' (1.829 m)   Wt 137 lb (62.1 kg)   BMI 18.58 kg/m  NAD. Alert, oriented. Normal ROM of the neck right shoulder and right elbow without tenderness. Wrist area and lower arm nontender to palpation. Normal range of motion of right wrist without tenderness or limitation. Strong radial pulse. Sensation equal and intact both hands. Hand strength 5+ bilateral. Very limited rotation of the right lower arm to midline.  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 Restart wrist brace, wear as much as possible. Neuropathic symptoms may be due to prior elbow injury. Patient to call back in 2 weeks if symptoms persist, consider referral to hand and arm specialist.

## 2017-02-14 ENCOUNTER — Ambulatory Visit: Payer: BLUE CROSS/BLUE SHIELD | Admitting: Nurse Practitioner

## 2017-02-14 ENCOUNTER — Encounter: Payer: Self-pay | Admitting: Nurse Practitioner

## 2017-02-14 ENCOUNTER — Ambulatory Visit (HOSPITAL_COMMUNITY)
Admission: RE | Admit: 2017-02-14 | Discharge: 2017-02-14 | Disposition: A | Discharge status: Home / Self Care | Payer: BLUE CROSS/BLUE SHIELD | Source: Ambulatory Visit | Attending: Nurse Practitioner | Admitting: Nurse Practitioner

## 2017-02-14 VITALS — BP 130/86 | Temp 97.6°F | Ht 72.0 in | Wt 134.0 lb

## 2017-02-14 DIAGNOSIS — M25511 Pain in right shoulder: Secondary | ICD-10-CM

## 2017-02-14 DIAGNOSIS — M5030 Other cervical disc degeneration, unspecified cervical region: Secondary | ICD-10-CM | POA: Diagnosis not present

## 2017-02-14 DIAGNOSIS — M542 Cervicalgia: Secondary | ICD-10-CM

## 2017-02-14 DIAGNOSIS — J300 Vasomotor rhinitis: Secondary | ICD-10-CM | POA: Diagnosis not present

## 2017-02-14 DIAGNOSIS — G5621 Lesion of ulnar nerve, right upper limb: Secondary | ICD-10-CM | POA: Diagnosis not present

## 2017-02-14 IMAGING — DX DG CERVICAL SPINE COMPLETE 4+V
5 series · 5 of 5 positions shown · non-contrast
Comparison: None.

CLINICAL DATA: Right-sided neck pain and shoulder pain, no known
injury, initial encounter

EXAM:
CERVICAL SPINE - COMPLETE 4+ VIEW

[c-spine lat]
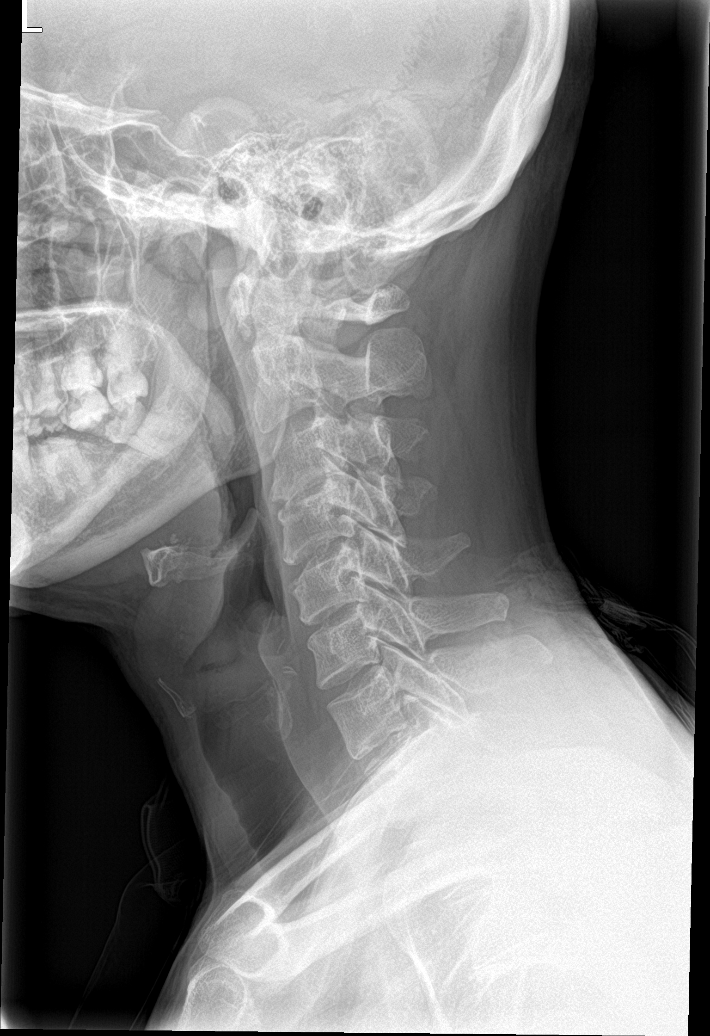

[c-spine obl (1 of 2)]
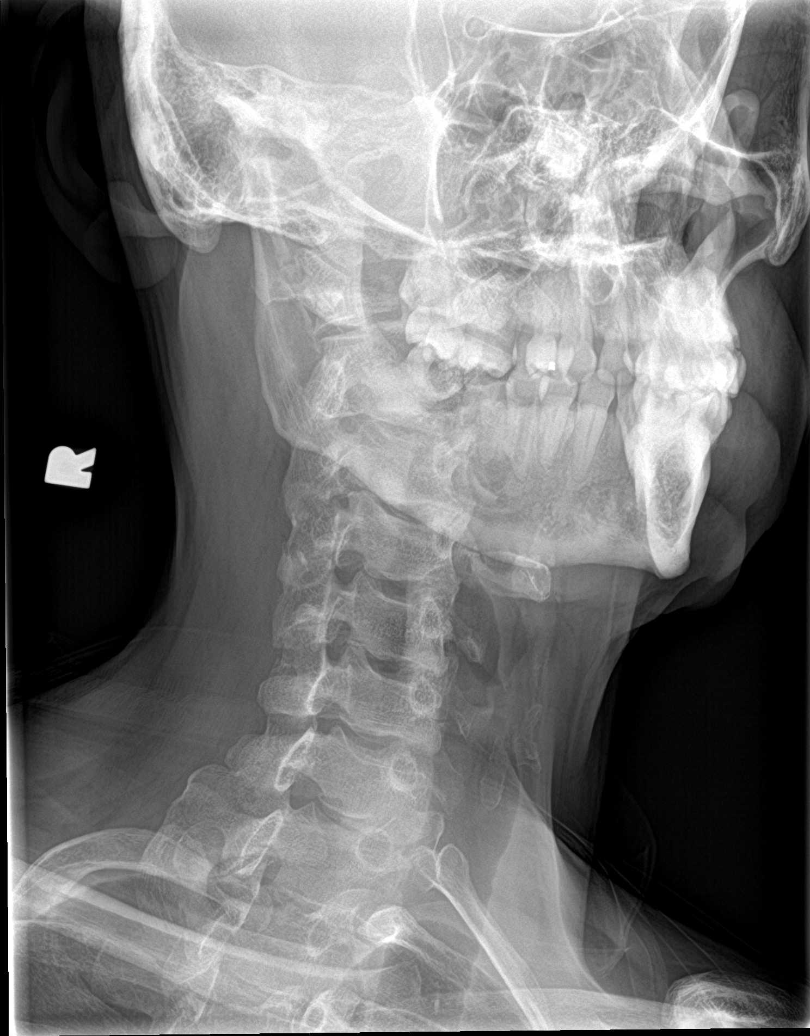

[c-spine obl (2 of 2)]
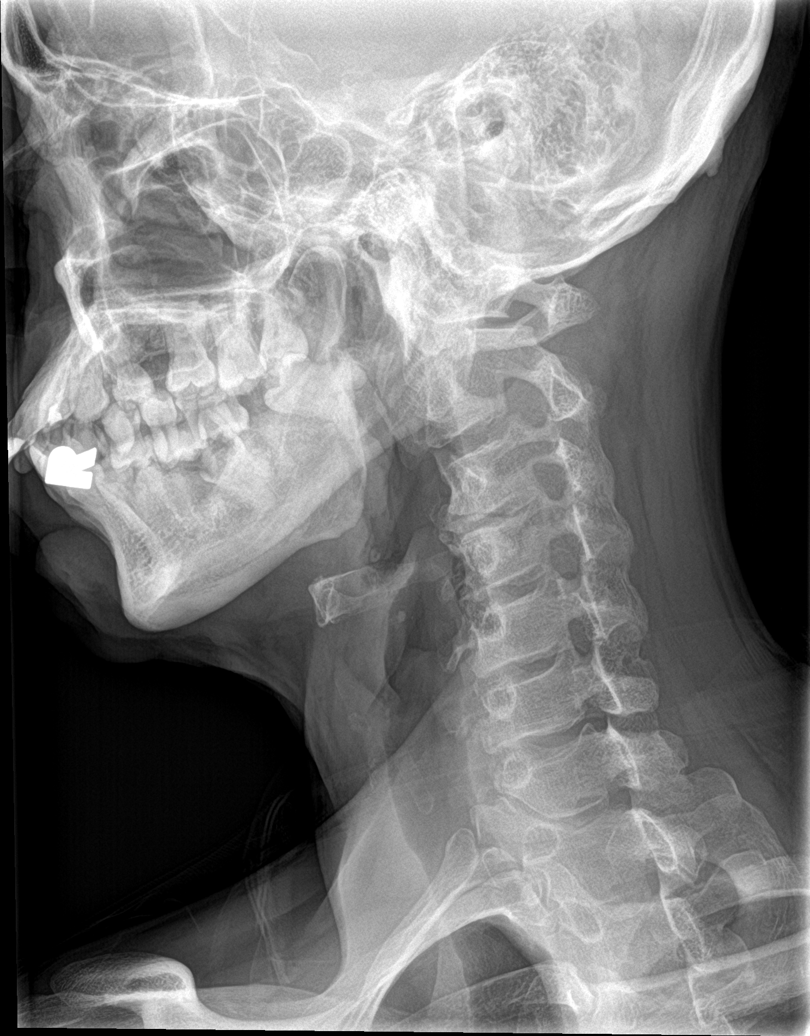

[c-spine ap]
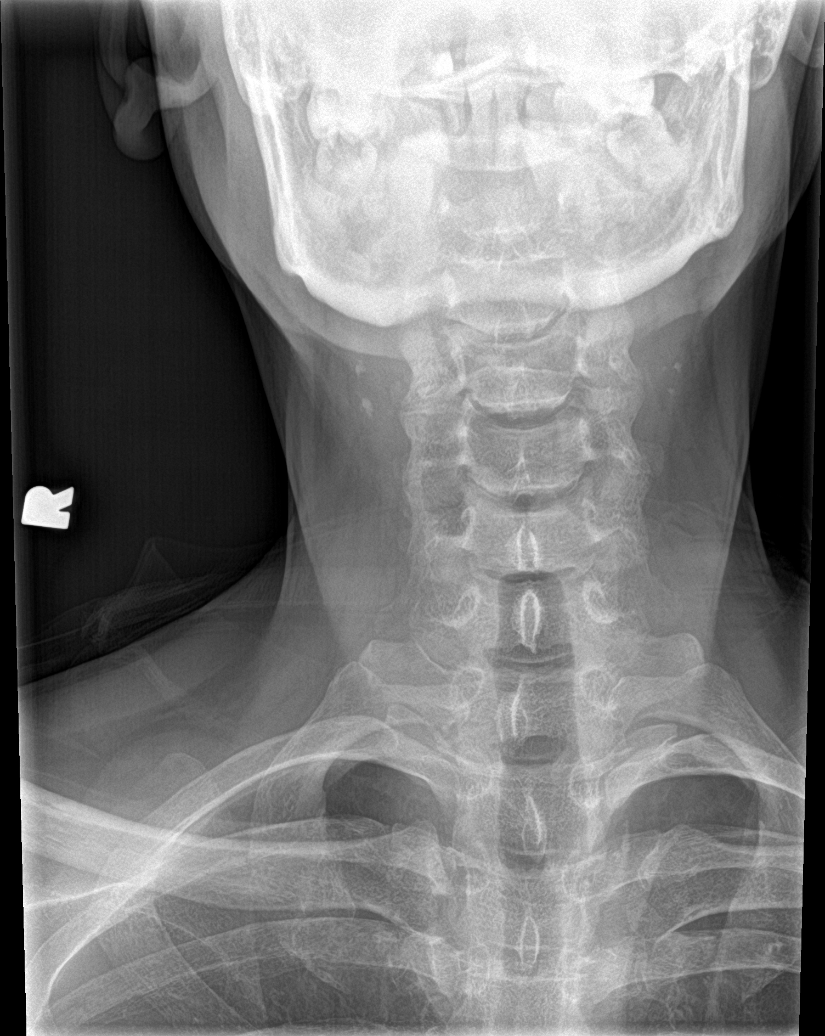

[c-spine open mouth]
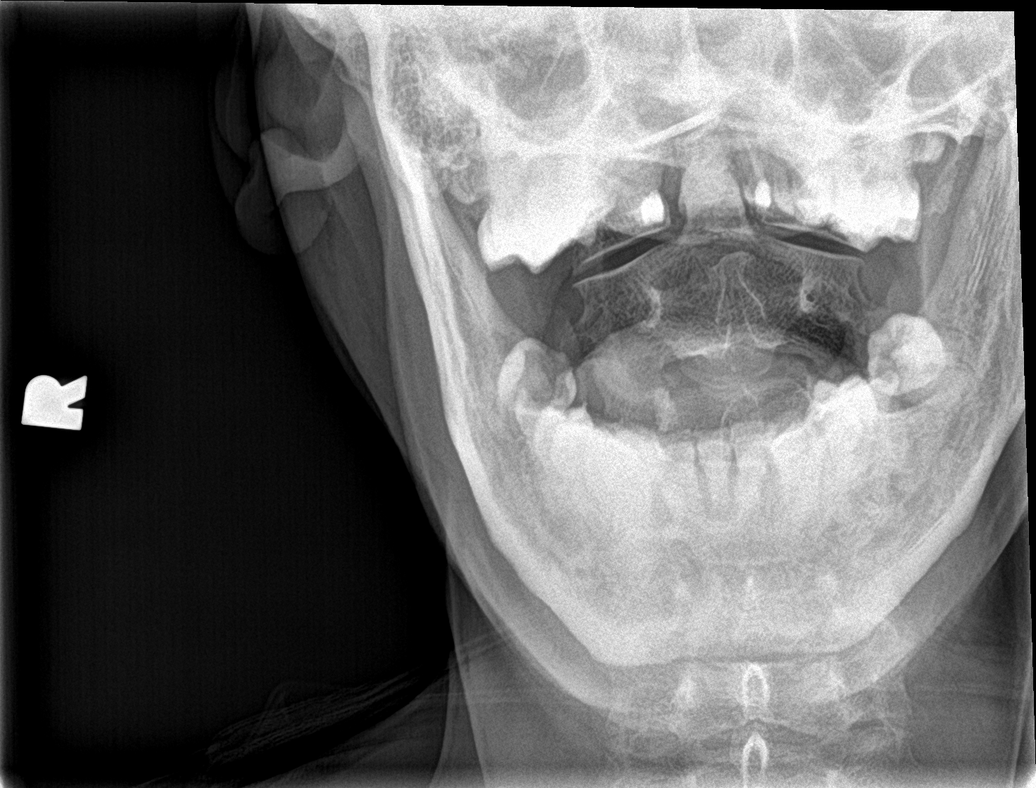

[5 of 5 positions shown; findings below may reference images not displayed]

FINDINGS: Seven cervical segments are well visualized. Vertebral body height
is well maintained. Mild osteophytic changes are noted at C3-4 and
C4-5. No anterolisthesis is noted. Mild facet hypertrophic changes
are seen. The neural foramina are patent bilaterally. The odontoid
is within normal limits. Bilateral carotid calcifications are seen.
No other soft tissue abnormality is noted.
IMPRESSION: Mild degenerative change without acute abnormality.

## 2017-02-14 IMAGING — DX DG SHOULDER 2+V*R*
3 series · 3 of 3 positions shown · non-contrast
Comparison: None.

CLINICAL DATA: Right-sided shoulder pain, no known injury, initial
encounter

EXAM:
RIGHT SHOULDER - 2+ VIEW

[shoulder grashey]
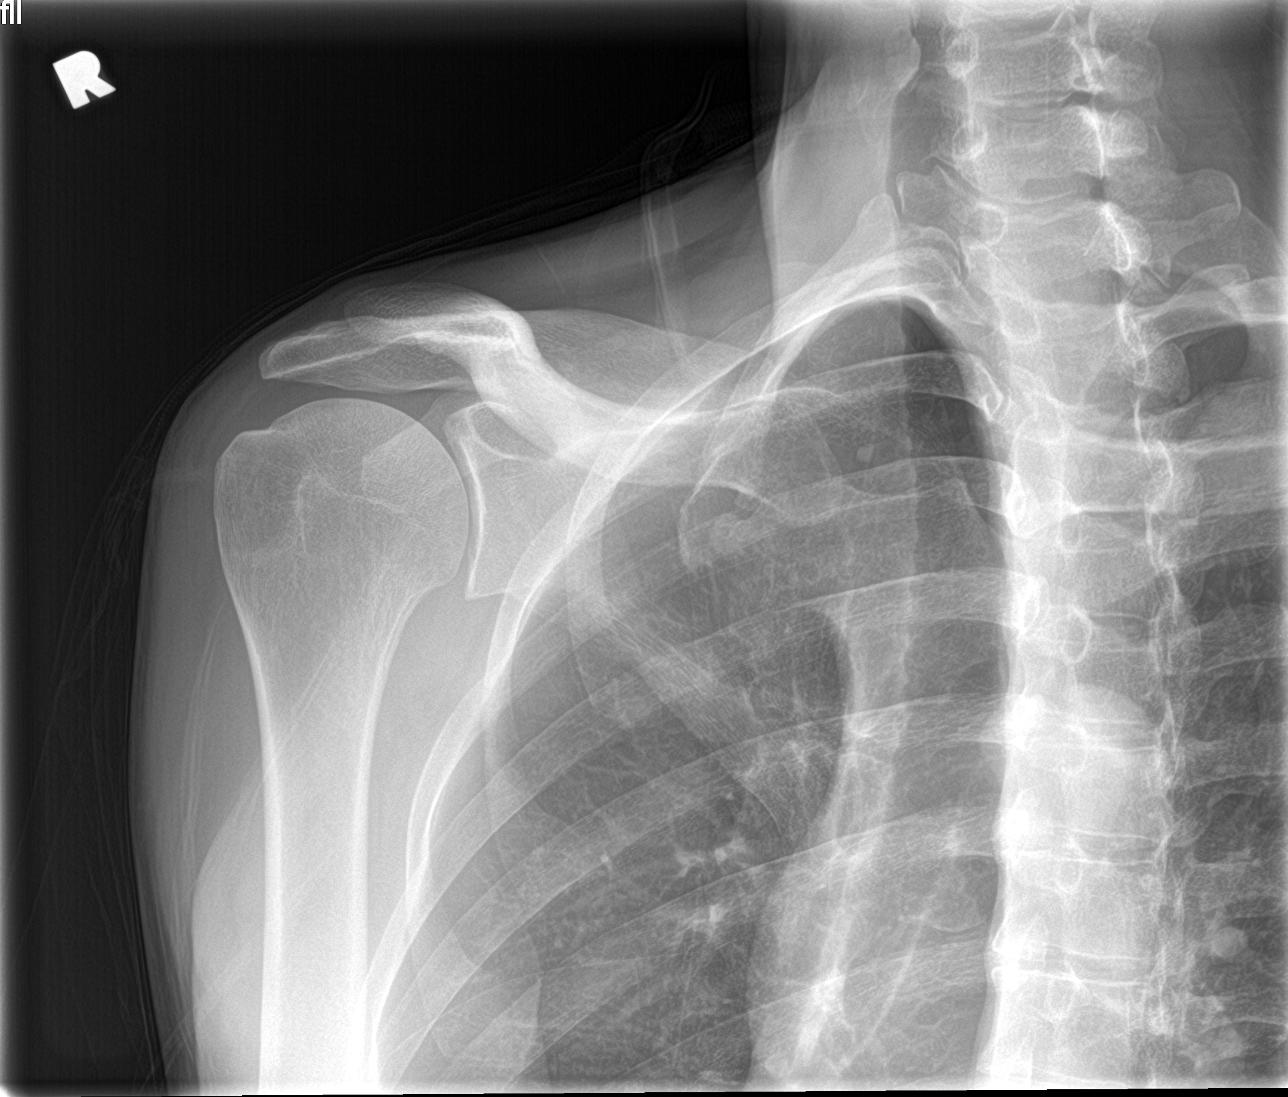

[shoulder y view]
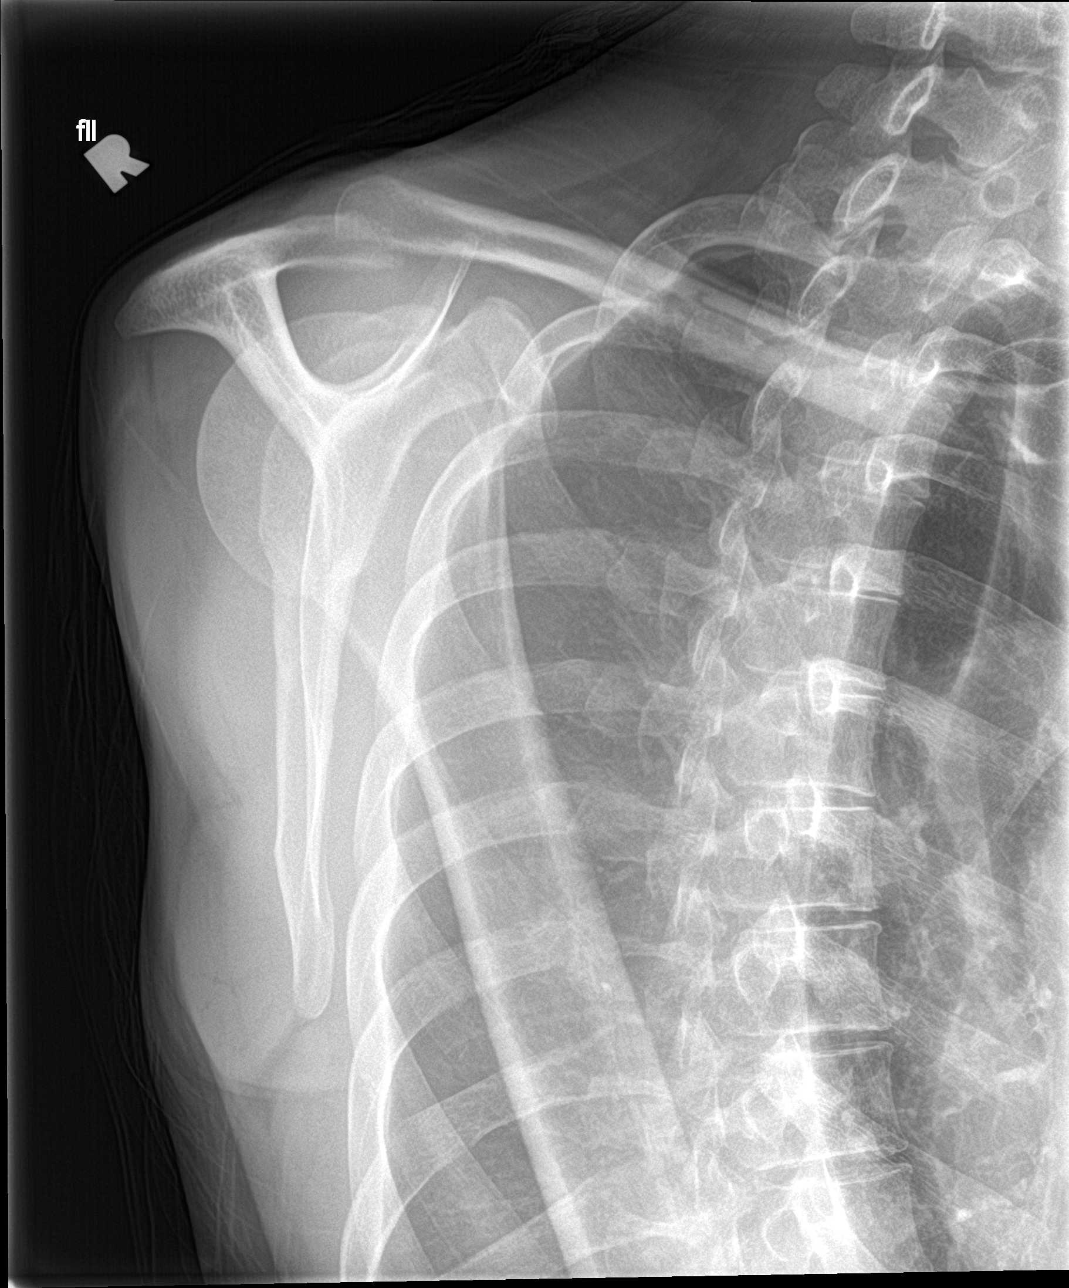

[shoulder axillary]
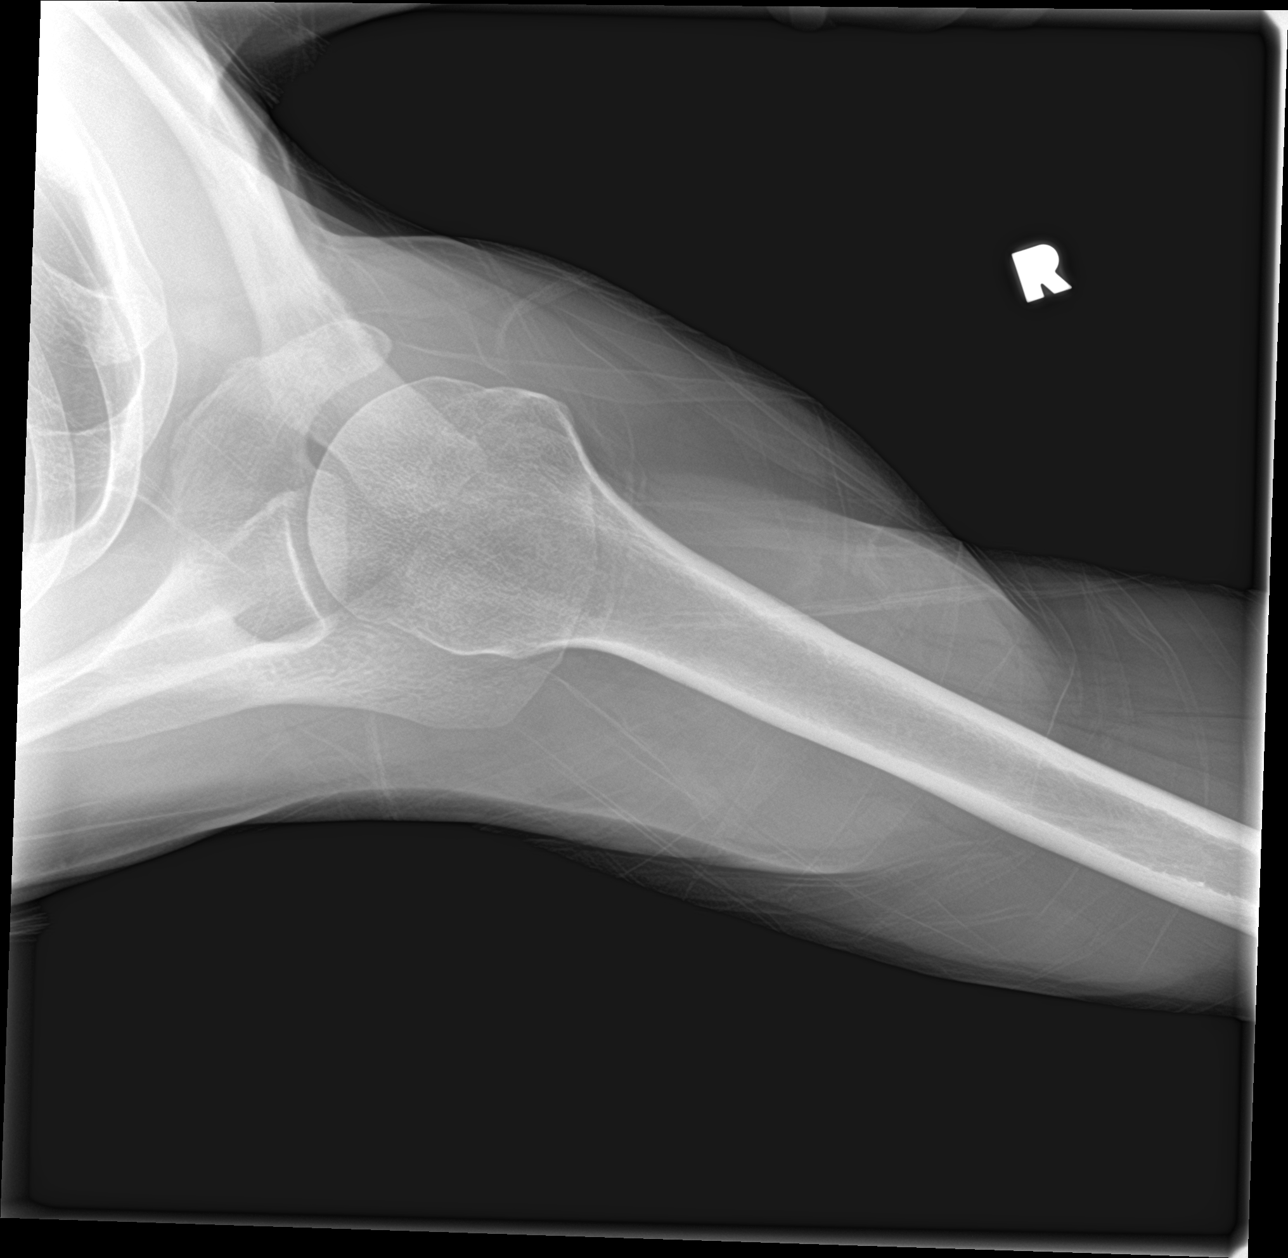

[3 of 3 positions shown; findings below may reference images not displayed]

FINDINGS: No acute fracture or dislocation is noted. The underlying bony
thorax is within normal limits. Calcified granuloma is noted in the
right apex. No other focal abnormality is seen.
IMPRESSION: No acute abnormality noted.

## 2017-02-14 NOTE — Patient Instructions (Signed)
Start flonase daily

## 2017-02-16 ENCOUNTER — Encounter: Payer: Self-pay | Admitting: Nurse Practitioner

## 2017-02-16 NOTE — Progress Notes (Signed)
Subjective:  Presents for c/o pain in the right shoulder area that began about 2 weeks ago. No specific history of injury. Very active job requiring lifting from 20-50 lbs at times. Occurs off/on. Worse with certain neck movements. No numbness or weakness of the arms or hands. Remote history of fracture at his right elbow which has always limited rotation of his right arm. Mild head congestion. No fever or cough. Ear pressure. No sore throat. Off/on sinus pressure. At end of visit, mentions that his right hand symptoms have persisted since visit on 09/14/16. Did not have time to call back.   Objective:   BP 130/86   Temp 97.6 F (36.4 C) (Oral)   Ht 6' (1.829 m)   Wt 134 lb (60.8 kg)   BMI 18.17 kg/m  NAD. Alert, oriented. TMs mild clear effusion. Pharynx clear. Neck supple with mild anterior adenopathy. Lungs clear. Heart RRR. Can perform full active and passive ROM of the right shoulder with no limitations or tenderness. Lateral movement produces mild pain near the right shoulder. Limited hyperextension of the neck. Hand and arm strength 5+ bilat. Radial pulses strong.   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 Xrays pending. Due to persistent hand neuropathy, will refer to hand specialist.  Defers analgesic for pain. Will continue OTC Ibuprofen. Recheck here as needed. 25 minutes was spent with the patient. Greater than half the time was spent in discussion and answering questions and counseling regarding the issues that the patient came in for today.

## 2017-02-21 ENCOUNTER — Encounter: Payer: Self-pay | Admitting: Family Medicine

## 2017-03-08 ENCOUNTER — Telehealth: Payer: Self-pay | Admitting: Nurse Practitioner

## 2017-03-08 ENCOUNTER — Other Ambulatory Visit: Payer: Self-pay | Admitting: Nurse Practitioner

## 2017-03-08 MED ORDER — PENICILLIN V POTASSIUM 500 MG PO TABS: 500.0000 mg | ORAL_TABLET | Freq: Four times a day (QID) | ORAL | 0 refills | Status: DC

## 2017-03-08 MED ORDER — HYDROCODONE-ACETAMINOPHEN 5-325 MG PO TABS: 1.0000 | ORAL_TABLET | ORAL | 0 refills | Status: DC | PRN

## 2017-03-08 MED ORDER — NAPROXEN 500 MG PO TABS: 500.0000 mg | ORAL_TABLET | Freq: Two times a day (BID) | ORAL | 0 refills | Status: DC

## 2017-03-08 NOTE — Telephone Encounter (Signed)
Left message to return call 

## 2017-03-08 NOTE — Telephone Encounter (Signed)
Just sent in anti inflammatory med, pain med and antibiotic for his tooth. We have sent in referral regarding his hand, hopefully he is going to this appointment As we discussed, if his shoulder pain persists, recommend orthopedic consult. Thanks.

## 2017-03-08 NOTE — Telephone Encounter (Signed)
Patient said that his shoulder has continued to hurt.  He seen Hoyle Sauer for this on 02/14/17.  He said ibuprofen is not helping and would like something stronger.  Also, would like Rx for antibiotic for tooth abscess.   [facility]

## 2017-03-08 NOTE — Telephone Encounter (Signed)
Discussed with pt. Pt verbalized understanding.  °

## 2017-07-20 ENCOUNTER — Telehealth: Payer: Self-pay

## 2017-07-20 NOTE — Telephone Encounter (Signed)
Patient called states he has been tired,weak and dizzy for the last few days after working in the yard two days ago. I spoke with Dr.Scott Luking to see if we could see today at 5 pm with these symptoms.Per Dr.Luking see if pt could come in today with in the next 30 mins and we would work in. If not send to urgent care.I spoke with the pt and he states he feels some what better today and wanted to be seen Monday as he is at work.I advised that we recommend to be seen sooner than that and to go to Urgent care if possible. Pt states understanding.

## 2017-07-23 ENCOUNTER — Ambulatory Visit: Payer: BLUE CROSS/BLUE SHIELD | Admitting: Family Medicine

## 2017-07-23 ENCOUNTER — Encounter: Payer: Self-pay | Admitting: Family Medicine

## 2017-07-23 VITALS — BP 128/84 | Temp 98.4°F | Ht 72.0 in | Wt 134.0 lb

## 2017-07-23 DIAGNOSIS — Z13220 Encounter for screening for lipoid disorders: Secondary | ICD-10-CM | POA: Diagnosis not present

## 2017-07-23 DIAGNOSIS — R531 Weakness: Secondary | ICD-10-CM

## 2017-07-23 DIAGNOSIS — Z125 Encounter for screening for malignant neoplasm of prostate: Secondary | ICD-10-CM | POA: Diagnosis not present

## 2017-07-23 DIAGNOSIS — R5383 Other fatigue: Secondary | ICD-10-CM

## 2017-07-23 LAB — POCT GLUCOSE (DEVICE FOR HOME USE): Glucose Fasting, POC: 88 mg/dL (ref 70–99)

## 2017-07-23 LAB — POCT HEMOGLOBIN: HEMOGLOBIN: 11.8 g/dL — AB (ref 14.1–18.1)

## 2017-07-23 MED ORDER — PANTOPRAZOLE SODIUM 40 MG PO TBEC: 40.0000 mg | DELAYED_RELEASE_TABLET | Freq: Every day | ORAL | 0 refills | Status: DC

## 2017-07-23 MED ORDER — DOXYCYCLINE HYCLATE 100 MG PO TABS: 100.0000 mg | ORAL_TABLET | Freq: Two times a day (BID) | ORAL | 0 refills | Status: DC

## 2017-07-23 NOTE — Progress Notes (Signed)
   Subjective:    Patient ID: Daniel Burton, male    DOB: [birth year], 57 y.o.   MRN: [id number]  HPIFatigue, weakness, dizziness and headache.  Started 3 days ago.   Pt states he had a fasting glucose done 2 days ago at work and it was 106.  Results for orders placed or performed in visit on 07/23/17  POCT hemoglobin  Result Value Ref Range   Hemoglobin 11.8 (A) 14.1 - 18.1 g/dL  POCT Glucose (Device for Home Use)  Result Value Ref Range   Glucose Fasting, POC 88 70 - 99 mg/dL   POC Glucose  70 - 99 mg/dl   BP cked that day 118 over something  Felt bad on tursday, plowed garden  Felt dizzy and weak   No cough no fever  Off and on headaceh   Some headache  Felt tired   Urine got dark  No blood in stool s no black stools   Patient does admit to occasional alcohol intake.  Sometimes substantial.  Some heartburn and reflux symptoms.  Some upper mid abdominal discomfort intermittently burning in nature upper mid abdomen tender  Review of Systems No headache, no major weight loss or weight gain, no chest pain no back pain abdominal pain no change in bowel habits complete ROS otherwise negative     Objective:   Physical Exam  Alert and oriented, vitals reviewed and stable, NAD ENT-TM's and ext canals WNL bilat via otoscopic exam Soft palate, tonsils and post pharynx WNL via oropharyngeal exam Neck-symmetric, no masses; thyroid nonpalpable and nontender Pulmonary-no tachypnea or accessory muscle use; Clear without wheezes via auscultation Card--no abnrml murmurs, rhythm reg and rate WNL Carotid pulses symmetric, without bruits Upperu mid abdomen tender to palpation     Assessment & Plan:  1 fatigue.  Protracted.  Months duration.  Etiology unclear  2.  Anemia.  Details uncertain.  Discussed.  No noticeable loss of blood in stools  3.  Dizziness nonspecific intermittent  4 gastritis./Question if etiology to anemia.  Add daily Protonix.  Rationale  discussed.  Diet exercise discussed/appropriate blood work/follow-up for complete physical in several weeks  Greater than 50% of this 25 minute face to face visit was spent in counseling and discussion and coordination of care regarding the above diagnosis/diagnosies

## 2017-08-06 ENCOUNTER — Telehealth: Payer: Self-pay | Admitting: *Deleted

## 2017-08-06 NOTE — Telephone Encounter (Signed)
I called left a message to r/c. The blood work is already ordered and in epic,pt just needs to go have drawn.

## 2017-08-06 NOTE — Telephone Encounter (Signed)
Patient came in and made an appt for next Wednesday 5/22, patient states he needs a order for blood work. Please advise

## 2017-08-07 NOTE — Telephone Encounter (Signed)
Pt.notified

## 2017-08-13 ENCOUNTER — Telehealth: Payer: Self-pay

## 2017-08-13 ENCOUNTER — Other Ambulatory Visit: Payer: Self-pay

## 2017-08-13 DIAGNOSIS — R5383 Other fatigue: Secondary | ICD-10-CM

## 2017-08-13 DIAGNOSIS — Z13220 Encounter for screening for lipoid disorders: Secondary | ICD-10-CM

## 2017-08-13 DIAGNOSIS — Z79899 Other long term (current) drug therapy: Secondary | ICD-10-CM

## 2017-08-13 DIAGNOSIS — Z125 Encounter for screening for malignant neoplasm of prostate: Secondary | ICD-10-CM

## 2017-08-13 NOTE — Telephone Encounter (Signed)
ok 

## 2017-08-13 NOTE — Telephone Encounter (Signed)
FYI: Patient called today stating he had tried to get his labs drawn at [facility] on Saturday and they told him they did not have orders on him. The orders were in the system,so I don't know why they told him this. I advised him that the orders were in the system,but he states that since he was unable to get his labs drawn on Saturday he will be unable to keep his appt with you on this coming Wednesday. I told him I would print the orders off for him to pick up and place them up front for him to take to [facility],and transferred him up front to reschedule the appt.Lab orders placed up front for him to have labs drawn.

## 2017-08-15 ENCOUNTER — Ambulatory Visit: Payer: BLUE CROSS/BLUE SHIELD | Admitting: Family Medicine

## 2017-08-21 DIAGNOSIS — R5383 Other fatigue: Secondary | ICD-10-CM | POA: Diagnosis not present

## 2017-08-21 DIAGNOSIS — Z79899 Other long term (current) drug therapy: Secondary | ICD-10-CM | POA: Diagnosis not present

## 2017-08-21 DIAGNOSIS — Z13220 Encounter for screening for lipoid disorders: Secondary | ICD-10-CM | POA: Diagnosis not present

## 2017-08-21 DIAGNOSIS — Z125 Encounter for screening for malignant neoplasm of prostate: Secondary | ICD-10-CM | POA: Diagnosis not present

## 2017-08-22 LAB — HEPATIC FUNCTION PANEL
ALBUMIN: 4.8 g/dL (ref 3.5–5.5)
ALK PHOS: 66 IU/L (ref 39–117)
ALT: 26 IU/L (ref 0–44)
AST: 39 IU/L (ref 0–40)
BILIRUBIN, DIRECT: 0.21 mg/dL (ref 0.00–0.40)
Bilirubin Total: 0.9 mg/dL (ref 0.0–1.2)
TOTAL PROTEIN: 7.3 g/dL (ref 6.0–8.5)

## 2017-08-22 LAB — TSH: TSH: 1.64 u[IU]/mL (ref 0.450–4.500)

## 2017-08-22 LAB — BASIC METABOLIC PANEL
BUN / CREAT RATIO: 16 (ref 9–20)
BUN: 16 mg/dL (ref 6–24)
CO2: 21 mmol/L (ref 20–29)
CREATININE: 1.01 mg/dL (ref 0.76–1.27)
Calcium: 9.7 mg/dL (ref 8.7–10.2)
Chloride: 103 mmol/L (ref 96–106)
GFR calc Af Amer: 96 mL/min/{1.73_m2} (ref >59)
GFR, EST NON AFRICAN AMERICAN: 83 mL/min/{1.73_m2} (ref >59)
Glucose: 98 mg/dL (ref 65–99)
Potassium: 4.1 mmol/L (ref 3.5–5.2)
SODIUM: 142 mmol/L (ref 134–144)

## 2017-08-22 LAB — CBC WITH DIFFERENTIAL/PLATELET
Basophils Absolute: 0 10*3/uL (ref 0.0–0.2)
Basos: 1 %
EOS (ABSOLUTE): 0.2 10*3/uL (ref 0.0–0.4)
EOS: 3 %
HEMATOCRIT: 40.4 % (ref 37.5–51.0)
Hemoglobin: 13.2 g/dL (ref 13.0–17.7)
IMMATURE GRANULOCYTES: 0 %
Immature Grans (Abs): 0 10*3/uL (ref 0.0–0.1)
LYMPHS ABS: 1.4 10*3/uL (ref 0.7–3.1)
Lymphs: 23 %
MCH: 30 pg (ref 26.6–33.0)
MCHC: 32.7 g/dL (ref 31.5–35.7)
MCV: 92 fL (ref 79–97)
MONOS ABS: 0.5 10*3/uL (ref 0.1–0.9)
Monocytes: 8 %
NEUTROS PCT: 65 %
Neutrophils Absolute: 4.1 10*3/uL (ref 1.4–7.0)
Platelets: 241 10*3/uL (ref 150–450)
RBC: 4.4 x10E6/uL (ref 4.14–5.80)
RDW: 15.1 % (ref 12.3–15.4)
WBC: 6.2 10*3/uL (ref 3.4–10.8)

## 2017-08-22 LAB — PSA: PROSTATE SPECIFIC AG, SERUM: 0.5 ng/mL (ref 0.0–4.0)

## 2017-08-22 LAB — FERRITIN: Ferritin: 200 ng/mL (ref 30–400)

## 2017-08-22 LAB — LIPID PANEL
Chol/HDL Ratio: 4 ratio (ref 0.0–5.0)
Cholesterol, Total: 252 mg/dL — ABNORMAL HIGH (ref 100–199)
HDL: 63 mg/dL (ref >39)
LDL Calculated: 160 mg/dL — ABNORMAL HIGH (ref 0–99)
Triglycerides: 143 mg/dL (ref 0–149)
VLDL CHOLESTEROL CAL: 29 mg/dL (ref 5–40)

## 2017-08-24 ENCOUNTER — Ambulatory Visit (INDEPENDENT_AMBULATORY_CARE_PROVIDER_SITE_OTHER): Payer: BLUE CROSS/BLUE SHIELD | Admitting: Family Medicine

## 2017-08-24 ENCOUNTER — Encounter: Payer: Self-pay | Admitting: Family Medicine

## 2017-08-24 VITALS — BP 128/88 | Ht 71.25 in | Wt 133.6 lb

## 2017-08-24 DIAGNOSIS — Z0000 Encounter for general adult medical examination without abnormal findings: Secondary | ICD-10-CM | POA: Diagnosis not present

## 2017-08-24 NOTE — Progress Notes (Signed)
Subjective:    Patient ID: Daniel Burton, male    DOB: Apr 17, 1960, 57 y.o.   MRN: [id number]  HPI The patient comes in today for a wellness visit.    A review of their health history was completed.  A review of medications was also completed.  Any needed refills; not taking any meds  Eating habits: not health conscious  Falls/  MVA accidents in past few months: none  Regular exercise: none  Specialist pt sees on regular basis: none  Preventative health issues were discussed.   Additional concerns: right hand tingling off and on for awhile. Saw carolyn and she gave number to call the hand center. Pt states tingling stopped so he did not call.   Results for orders placed or performed in visit on 08/13/17  Hepatic function panel  Result Value Ref Range   Total Protein 7.3 6.0 - 8.5 g/dL   Albumin 4.8 3.5 - 5.5 g/dL   Bilirubin Total 0.9 0.0 - 1.2 mg/dL   Bilirubin, Direct 0.21 0.00 - 0.40 mg/dL   Alkaline Phosphatase 66 39 - 117 IU/L   AST 39 0 - 40 IU/L   ALT 26 0 - 44 IU/L  Basic metabolic panel  Result Value Ref Range   Glucose 98 65 - 99 mg/dL   BUN 16 6 - 24 mg/dL   Creatinine, Ser 1.01 0.76 - 1.27 mg/dL   GFR calc non Af Amer 83 >59 mL/min/1.73   GFR calc Af Amer 96 >59 mL/min/1.73   BUN/Creatinine Ratio 16 9 - 20   Sodium 142 134 - 144 mmol/L   Potassium 4.1 3.5 - 5.2 mmol/L   Chloride 103 96 - 106 mmol/L   CO2 21 20 - 29 mmol/L   Calcium 9.7 8.7 - 10.2 mg/dL  CBC with Differential/Platelet  Result Value Ref Range   WBC 6.2 3.4 - 10.8 x10E3/uL   RBC 4.40 4.14 - 5.80 x10E6/uL   Hemoglobin 13.2 13.0 - 17.7 g/dL   Hematocrit 40.4 37.5 - 51.0 %   MCV 92 79 - 97 fL   MCH 30.0 26.6 - 33.0 pg   MCHC 32.7 31.5 - 35.7 g/dL   RDW 15.1 12.3 - 15.4 %   Platelets 241 150 - 450 x10E3/uL   Neutrophils 65 Not Estab. %   Lymphs 23 Not Estab. %   Monocytes 8 Not Estab. %   Eos 3 Not Estab. %   Basos 1 Not Estab. %   Neutrophils Absolute 4.1 1.4 - 7.0 x10E3/uL     Lymphocytes Absolute 1.4 0.7 - 3.1 x10E3/uL   Monocytes Absolute 0.5 0.1 - 0.9 x10E3/uL   EOS (ABSOLUTE) 0.2 0.0 - 0.4 x10E3/uL   Basophils Absolute 0.0 0.0 - 0.2 x10E3/uL   Immature Granulocytes 0 Not Estab. %   Immature Grans (Abs) 0.0 0.0 - 0.1 x10E3/uL  Ferritin  Result Value Ref Range   Ferritin 200 30 - 400 ng/mL  TSH  Result Value Ref Range   TSH 1.640 0.450 - 4.500 uIU/mL  PSA  Result Value Ref Range   Prostate Specific Ag, Serum 0.5 0.0 - 4.0 ng/mL  Lipid panel  Result Value Ref Range   Cholesterol, Total 252 (H) 100 - 199 mg/dL   Triglycerides 143 0 - 149 mg/dL   HDL 63 >39 mg/dL   VLDL Cholesterol Cal 29 5 - 40 mg/dL   LDL Calculated 160 (H) 0 - 99 mg/dL   Chol/HDL Ratio 4.0 0.0 - 5.0 ratio  Right hand numbness and tingling into thumb right hand, off and on , bothers some, usually no nocturnal     Review of Systems  Constitutional: Negative for activity change, appetite change and fever.  HENT: Negative for congestion and rhinorrhea.   Eyes: Negative for discharge.  Respiratory: Negative for cough and wheezing.   Cardiovascular: Negative for chest pain.  Gastrointestinal: Negative for abdominal pain, blood in stool and vomiting.  Genitourinary: Negative for difficulty urinating and frequency.  Musculoskeletal: Negative for neck pain.  Skin: Negative for rash.  Allergic/Immunologic: Negative for environmental allergies and food allergies.  Neurological: Negative for weakness and headaches.  Psychiatric/Behavioral: Negative for agitation.  All other systems reviewed and are negative.      Objective:   Physical Exam  Constitutional: He appears well-developed and well-nourished.  HENT:  Head: Normocephalic and atraumatic.  Right Ear: External ear normal.  Left Ear: External ear normal.  Nose: Nose normal.  Mouth/Throat: Oropharynx is clear and moist.  Eyes: Pupils are equal, round, and reactive to light. EOM are normal.  Neck: Normal range of motion.  Neck supple. No thyromegaly present.  Cardiovascular: Normal rate, regular rhythm and normal heart sounds.  No murmur heard. Pulmonary/Chest: Effort normal and breath sounds normal. No respiratory distress. He has no wheezes.  Abdominal: Soft. Bowel sounds are normal. He exhibits no distension and no mass. There is no tenderness.  Genitourinary: Prostate normal and penis normal.  Musculoskeletal: Normal range of motion. He exhibits no edema.  Lymphadenopathy:    He has no cervical adenopathy.  Neurological: He is alert. He exhibits normal muscle tone.  Skin: Skin is warm and dry. No erythema.  Psychiatric: He has a normal mood and affect. His behavior is normal. Judgment normal.  Vitals reviewed.         Assessment & Plan:  1 impression wellness exam.  Diet discussed.  Exercise discussed.  Hyperlipidemia discussed.  Patient work on cutting down fats in diet.  Patient was smoking now has stopped.  Has not had a colonoscopy yet I offered to set one up he declined and took the name of the GI doctors to call self  2.  Fatigue see earlier note now resolved  Diet exercise discussed.  If LDL drops below 130 with patient efforts no need for medications recheck in 6 to 12 months rationale discussed

## 2017-08-24 NOTE — Patient Instructions (Addendum)
High Cholesterol High cholesterol is a condition in which the blood has high levels of a white, waxy, fat-like substance (cholesterol). The human body needs small amounts of cholesterol. The liver makes all the cholesterol that the body needs. Extra (excess) cholesterol comes from the food that we eat. Cholesterol is carried from the liver by the blood through the blood vessels. If you have high cholesterol, deposits (plaques) may build up on the walls of your blood vessels (arteries). Plaques make the arteries narrower and stiffer. Cholesterol plaques increase your risk for heart attack and stroke. Work with your health care provider to keep your cholesterol levels in a healthy range. What increases the risk? This condition is more likely to develop in people who:  Eat foods that are high in animal fat (saturated fat) or cholesterol.  Are overweight.  Are not getting enough exercise.  Have a family history of high cholesterol.  What are the signs or symptoms? There are no symptoms of this condition. How is this diagnosed? This condition may be diagnosed from the results of a blood test.  If you are older than age 20, your health care provider may check your cholesterol every 4-6 years.  You may be checked more often if you already have high cholesterol or other risk factors for heart disease.  The blood test for cholesterol measures:  "Bad" cholesterol (LDL cholesterol). This is the main type of cholesterol that causes heart disease. The desired level for LDL is less than 100.  "Good" cholesterol (HDL cholesterol). This type helps to protect against heart disease by cleaning the arteries and carrying the LDL away. The desired level for HDL is 60 or higher.  Triglycerides. These are fats that the body can store or burn for energy. The desired number for triglycerides is lower than 150.  Total cholesterol. This is a measure of the total amount of cholesterol in your blood, including LDL  cholesterol, HDL cholesterol, and triglycerides. A healthy number is less than 200.  How is this treated? This condition is treated with diet changes, lifestyle changes, and medicines. Diet changes  This may include eating more whole grains, fruits, vegetables, nuts, and fish.  This may also include cutting back on red meat and foods that have a lot of added sugar. Lifestyle changes  Changes may include getting at least 40 minutes of aerobic exercise 3 times a week. Aerobic exercises include walking, biking, and swimming. Aerobic exercise along with a healthy diet can help you maintain a healthy weight.  Changes may also include quitting smoking. Medicines  Medicines are usually given if diet and lifestyle changes have failed to reduce your cholesterol to healthy levels.  Your health care provider may prescribe a statin medicine. Statin medicines have been shown to reduce cholesterol, which can reduce the risk of heart disease. Follow these instructions at home: Eating and drinking  If told by your health care provider:  Eat chicken (without skin), fish, veal, shellfish, ground turkey breast, and round or loin cuts of red meat.  Do not eat fried foods or fatty meats, such as hot dogs and salami.  Eat plenty of fruits, such as apples.  Eat plenty of vegetables, such as broccoli, potatoes, and carrots.  Eat beans, peas, and lentils.  Eat grains such as barley, rice, couscous, and bulgur wheat.  Eat pasta without cream sauces.  Use skim or nonfat milk, and eat low-fat or nonfat yogurt and cheeses.  Do not eat or drink whole milk, cream, ice   cream, egg yolks, or hard cheeses.  Do not eat stick margarine or tub margarines that contain trans fats (also called partially hydrogenated oils).  Do not eat saturated tropical oils, such as coconut oil and palm oil.  Do not eat cakes, cookies, crackers, or other baked goods that contain trans fats.  General instructions  Exercise  as directed by your health care provider. Increase your activity level with activities such as gardening, walking, and taking the stairs.  Take over-the-counter and prescription medicines only as told by your health care provider.  Do not use any products that contain nicotine or tobacco, such as cigarettes and e-cigarettes. If you need help quitting, ask your health care provider.  Keep all follow-up visits as told by your health care provider. This is important. Contact a health care provider if:  You are struggling to maintain a healthy diet or weight.  You need help to start on an exercise program.  You need help to stop smoking. Get help right away if:  You have chest pain.  You have trouble breathing. This information is not intended to replace advice given to you by your health care provider. Make sure you discuss any questions you have with your health care provider. Document Released: 03/13/2005 Document Revised: 10/09/2015 Document Reviewed: 09/11/2015 Elsevier Interactive Patient Education  2018 [employer]. Results for orders placed or performed in visit on 08/13/17  Hepatic function panel  Result Value Ref Range   Total Protein 7.3 6.0 - 8.5 g/dL   Albumin 4.8 3.5 - 5.5 g/dL   Bilirubin Total 0.9 0.0 - 1.2 mg/dL   Bilirubin, Direct 0.21 0.00 - 0.40 mg/dL   Alkaline Phosphatase 66 39 - 117 IU/L   AST 39 0 - 40 IU/L   ALT 26 0 - 44 IU/L  Basic metabolic panel  Result Value Ref Range   Glucose 98 65 - 99 mg/dL   BUN 16 6 - 24 mg/dL   Creatinine, Ser 1.01 0.76 - 1.27 mg/dL   GFR calc non Af Amer 83 >59 mL/min/1.73   GFR calc Af Amer 96 >59 mL/min/1.73   BUN/Creatinine Ratio 16 9 - 20   Sodium 142 134 - 144 mmol/L   Potassium 4.1 3.5 - 5.2 mmol/L   Chloride 103 96 - 106 mmol/L   CO2 21 20 - 29 mmol/L   Calcium 9.7 8.7 - 10.2 mg/dL  CBC with Differential/Platelet  Result Value Ref Range   WBC 6.2 3.4 - 10.8 x10E3/uL   RBC 4.40 4.14 - 5.80 x10E6/uL    Hemoglobin 13.2 13.0 - 17.7 g/dL   Hematocrit 40.4 37.5 - 51.0 %   MCV 92 79 - 97 fL   MCH 30.0 26.6 - 33.0 pg   MCHC 32.7 31.5 - 35.7 g/dL   RDW 15.1 12.3 - 15.4 %   Platelets 241 150 - 450 x10E3/uL   Neutrophils 65 Not Estab. %   Lymphs 23 Not Estab. %   Monocytes 8 Not Estab. %   Eos 3 Not Estab. %   Basos 1 Not Estab. %   Neutrophils Absolute 4.1 1.4 - 7.0 x10E3/uL   Lymphocytes Absolute 1.4 0.7 - 3.1 x10E3/uL   Monocytes Absolute 0.5 0.1 - 0.9 x10E3/uL   EOS (ABSOLUTE) 0.2 0.0 - 0.4 x10E3/uL   Basophils Absolute 0.0 0.0 - 0.2 x10E3/uL   Immature Granulocytes 0 Not Estab. %   Immature Grans (Abs) 0.0 0.0 - 0.1 x10E3/uL  Ferritin  Result Value Ref Range   Ferritin  200 30 - 400 ng/mL  TSH  Result Value Ref Range   TSH 1.640 0.450 - 4.500 uIU/mL  PSA  Result Value Ref Range   Prostate Specific Ag, Serum 0.5 0.0 - 4.0 ng/mL  Lipid panel  Result Value Ref Range   Cholesterol, Total 252 (H) 100 - 199 mg/dL   Triglycerides 143 0 - 149 mg/dL   HDL 63 >39 mg/dL   VLDL Cholesterol Cal 29 5 - 40 mg/dL   LDL Calculated 160 (H) 0 - 99 mg/dL   Chol/HDL Ratio 4.0 0.0 - 5.0 ratio

## 2017-09-06 ENCOUNTER — Other Ambulatory Visit: Payer: Self-pay | Admitting: *Deleted

## 2017-09-06 MED ORDER — PANTOPRAZOLE SODIUM 40 MG PO TBEC: 40.0000 mg | DELAYED_RELEASE_TABLET | Freq: Every day | ORAL | 5 refills | Status: DC

## 2017-09-17 ENCOUNTER — Telehealth: Payer: Self-pay | Admitting: Family Medicine

## 2017-09-17 NOTE — Telephone Encounter (Addendum)
Patient notified that Dr Richardson Landry stated that he would like the dentist who sees him tomorrow to handle pain management. Patient verbalized understanding.

## 2017-09-17 NOTE — Telephone Encounter (Signed)
Patient is requesting a prescription for pain medication.  He was given Rx for hydrocodone in December for the same issue, but he wants something stronger.  He has an abscess in his mouth and its causing him a lot of pain and causing him trouble sleeping.  He would like this ASAP if any way possible.  He declined an appointment today because he is seeing the dentist tomorrow.  [facility]

## 2017-09-17 NOTE — Telephone Encounter (Signed)
Patient requested pain med and antibiotic for tooth 03/08/17

## 2017-09-17 NOTE — Telephone Encounter (Signed)
Let dentist rx this

## 2017-10-01 ENCOUNTER — Ambulatory Visit: Payer: BLUE CROSS/BLUE SHIELD | Admitting: Family Medicine

## 2017-10-01 ENCOUNTER — Encounter: Payer: Self-pay | Admitting: Family Medicine

## 2017-10-01 VITALS — BP 124/86 | Temp 99.2°F | Ht 71.25 in | Wt 136.0 lb

## 2017-10-01 DIAGNOSIS — I889 Nonspecific lymphadenitis, unspecified: Secondary | ICD-10-CM

## 2017-10-01 DIAGNOSIS — N529 Male erectile dysfunction, unspecified: Secondary | ICD-10-CM

## 2017-10-01 DIAGNOSIS — H00011 Hordeolum externum right upper eyelid: Secondary | ICD-10-CM

## 2017-10-01 DIAGNOSIS — J029 Acute pharyngitis, unspecified: Secondary | ICD-10-CM | POA: Diagnosis not present

## 2017-10-01 LAB — POCT RAPID STREP A (OFFICE): Rapid Strep A Screen: NEGATIVE

## 2017-10-01 MED ORDER — CEFPROZIL 500 MG PO TABS: 500.0000 mg | ORAL_TABLET | Freq: Two times a day (BID) | ORAL | 0 refills | Status: DC

## 2017-10-01 NOTE — Progress Notes (Signed)
   Subjective:    Patient ID: Daniel Burton, male    DOB: [birth year], 57 y.o.   MRN: [id number] Patient arrives office for protracted discussion regarding 3 separate concerns HPI  Patient is here today with complaints of a sore throat since Thursday of last week. He also states the throat is red and hurts to swollow. He also says he had a swollen right eye which he thinks is a stye. He has been using a warm compress on it.  Started as a reg sore throat on thurs  Progressive pain with swallowing   Got real sore and sig painhx of strep in the past  Pain mainly of left side, sharp in nature  Notes history of recurring stye in right eye.  Upper eyelid.  Concerning to patient.  Comes and goes.  Now is feeling a residual bump between flares.  Wonders if he needs to do anything further other than symptomatic care  Patient notes progressive difficulty with erectile dysfunction.  Erections not working as well as they used to.  Progressive over the last couple years.  Was concerned if recent blood work showed any potential causes for this    Review of Systems No headache, no major weight loss or weight gain, no chest pain no back pain abdominal pain no change in bowel habits complete ROS otherwise negative     Objective:   Physical Exam  Alert and oriented, vitals reviewed and stable, NAD ENT-TM's and ext canals WNL bilat via otoscopic exam Soft palate, tonsils inflamed with exudate left side greater than right positive tender anterior nodes and post pharynx WNL via oropharyngeal exam Neck-symmetric, no masses; thyroid nonpalpable and nontender Pulmonary-no tachypnea or accessory muscle use; Clear without wheezes via auscultation Card--no abnrml murmurs, rhythm reg and rate WNL Carotid pulses symmetric, without bruits  Right ocular exam Swasey noted on upper eyelid.  Ocular exam otherwise normal     Assessment & Plan:  1 exudative tonsillitis/lymphadenitis discussed we will  proceed with antibiotics as discussed.   2.  Recurrent stye discussed at length.  With chalzzion disc #3 erectile dysfunction.  Offered medication.  Patient to think about it.  He would prefer to use supplements for now.  Greater than 50% of this 25 minute face to face visit was spent in counseling and discussion and coordination of care regarding the above diagnosis/diagnosies

## 2017-10-02 LAB — STREP A DNA PROBE: STREP GP A DIRECT, DNA PROBE: NEGATIVE

## 2017-11-08 ENCOUNTER — Telehealth: Payer: Self-pay | Admitting: Family Medicine

## 2017-11-08 NOTE — Telephone Encounter (Signed)
Pt has purchased Cellucor P6 Red Original G5 vitamins and he would like to know if it is ok to take them. The instructions on the bottle say to take 2 in the A.M. And 2 in the P.M. He has to work 7-7 tomorrow and request if someone calls to leave a voicemail if its ok to take them or not.

## 2017-11-08 NOTE — Telephone Encounter (Signed)
Please advise 

## 2017-11-09 NOTE — Telephone Encounter (Signed)
Sorry I have no knowledge or opinion of these

## 2017-11-09 NOTE — Telephone Encounter (Signed)
I called and left a message to r/c. 

## 2017-11-09 NOTE — Telephone Encounter (Signed)
Patient is aware 

## 2017-12-19 DIAGNOSIS — G5601 Carpal tunnel syndrome, right upper limb: Secondary | ICD-10-CM | POA: Diagnosis not present

## 2017-12-19 DIAGNOSIS — M5412 Radiculopathy, cervical region: Secondary | ICD-10-CM | POA: Diagnosis not present

## 2017-12-19 DIAGNOSIS — M79641 Pain in right hand: Secondary | ICD-10-CM | POA: Diagnosis not present

## 2017-12-25 ENCOUNTER — Encounter: Payer: Self-pay | Admitting: Neurology

## 2017-12-27 ENCOUNTER — Other Ambulatory Visit: Payer: Self-pay | Admitting: Neurology

## 2017-12-27 DIAGNOSIS — M79641 Pain in right hand: Secondary | ICD-10-CM

## 2017-12-27 DIAGNOSIS — M5412 Radiculopathy, cervical region: Secondary | ICD-10-CM

## 2017-12-27 DIAGNOSIS — G5601 Carpal tunnel syndrome, right upper limb: Secondary | ICD-10-CM

## 2018-01-22 ENCOUNTER — Ambulatory Visit (INDEPENDENT_AMBULATORY_CARE_PROVIDER_SITE_OTHER): Payer: BLUE CROSS/BLUE SHIELD | Admitting: Neurology

## 2018-01-22 ENCOUNTER — Encounter

## 2018-01-22 DIAGNOSIS — G5601 Carpal tunnel syndrome, right upper limb: Secondary | ICD-10-CM

## 2018-01-22 DIAGNOSIS — M79641 Pain in right hand: Secondary | ICD-10-CM

## 2018-01-22 DIAGNOSIS — G5623 Lesion of ulnar nerve, bilateral upper limbs: Secondary | ICD-10-CM

## 2018-01-22 DIAGNOSIS — M5412 Radiculopathy, cervical region: Secondary | ICD-10-CM

## 2018-01-22 NOTE — Procedures (Signed)
[facility]  [place]  [place], [place] [address] Tel: [phone] Fax:  [phone] Test Date:  01/22/2018  Patient: Daniel Burton DOB: May 22, 1960 Physician: Narda Amber, DO  Sex: Male Height: 6\' 0"  Ref Phys: Leanora Cover, MD  ID#: [id number] Temp: 33.0C Technician:    Patient Complaints: This is a 57 year old man with right hand paresthesias referred for evaluation of carpal tunnel syndrome.  NCV & EMG Findings: Extensive electrodiagnostic testing of the right upper extremity and additional studies of the left shows:  1. Bilateral median and mixed palmar sensory responses are within normal limits.  Bilateral ulnar sensory responses showed prolonged latency (R3.2, L3.5 ms) and normal amplitude. 2. Bilateral median motor responses are within normal limits.  Bilateral ulnar motor responses show decreased conduction velocity across the elbow (A Elbow-B Elbow, L43, R29 m/s).   3. Chronic motor axonal loss changes are seen affecting the ulnar innervated muscles bilaterally.  In addition, there are chronic neurogenic changes in the right pronator teres and triceps muscles.  There is no evidence of accompanied active denervation.  Impression: 1. Bilateral ulnar neuropathy with slowing across the elbow, purely demyelinating in type; these findings are mild-to-moderate in degree electrically and worse on the right.   2. Chronic C7 radiculopathy affecting the right upper extremity, moderate degree electrically. 3. There is no evidence of carpal tunnel syndrome affecting the upper extremities.   ___________________________ Narda Amber, DO    Nerve Conduction Studies Anti Sensory Summary Table   Site NR Peak (ms) Norm Peak (ms) P-T Amp (V) Norm P-T Amp  Left Median Anti Sensory (2nd Digit)  33C  Wrist    3.3 <3.6 33.1 >15  Right Median Anti Sensory (2nd Digit)  33C  Wrist    2.9 <3.6 27.3 >15  Left Ulnar Anti Sensory (5th Digit)  33C  Wrist    3.5  <3.1 22.6 >10  Right Ulnar Anti Sensory (5th Digit)  33C  Wrist    3.2 <3.1 15.7 >10   Motor Summary Table   Site NR Onset (ms) Norm Onset (ms) O-P Amp (mV) Norm O-P Amp Site1 Site2 Delta-0 (ms) Dist (cm) Vel (m/s) Norm Vel (m/s)  Left Median Motor (Abd Poll Brev)  33C  Wrist    3.4 <4.0 9.4 >6 Elbow Wrist 5.0 30.0 60 >50  Elbow    8.4  9.1         Right Median Motor (Abd Poll Brev)  33C  Wrist    3.1 <4.0 11.3 >6 Elbow Wrist 4.9 29.0 59 >50  Elbow    8.0  10.4         Left Ulnar Motor (Abd Dig Minimi)  33C  Wrist    3.0 <3.1 8.6 >7 B Elbow Wrist 4.7 26.0 55 >50  B Elbow    7.7  7.7  A Elbow B Elbow 2.3 10.0 43 >50  A Elbow    10.0  7.7         Right Ulnar Motor (Abd Dig Minimi)  33C  Wrist    2.3 <3.1 9.5 >7 B Elbow Wrist 4.8 27.0 56 >50  B Elbow    7.1  8.8  A Elbow B Elbow 3.4 10.0 29 >50  A Elbow    10.5  7.4          Comparison Summary Table   Site NR Peak (ms) Norm Peak (ms) P-T Amp (V) Site1 Site2 Delta-P (ms) Norm Delta (ms)  Left Median/Ulnar Palm  Comparison (Wrist - 8cm)  33C  Median Palm    2.1 <2.2 42.1 Median Palm Ulnar Palm 0.0   Ulnar Palm    2.1 <2.2 20.9      Right Median/Ulnar Palm Comparison (Wrist - 8cm)  33C  Median Palm    1.8 <2.2 31.6 Median Palm Ulnar Palm 0.1   Ulnar Palm    1.7 <2.2 8.3       EMG   Side Muscle Ins Act Fibs Psw Fasc Number Recrt Dur Dur. Amp Amp. Poly Poly. Comment  Right 1stDorInt Nml Nml Nml Nml 1- Rapid Some 1+ Some 1+ Some 1+ N/A  Right ABD Dig Min Nml Nml Nml Nml Nml Nml Nml Nml Nml Nml Nml Nml N/A  Right PronatorTeres Nml Nml Nml Nml 1- Rapid Some 1+ Some 1+ Nml Nml N/A  Right Biceps Nml Nml Nml Nml Nml Nml Nml Nml Nml Nml Nml Nml N/A  Right Triceps Nml Nml Nml Nml 1- Rapid Some 1+ Some 1+ Nml Nml N/A  Right Deltoid Nml Nml Nml Nml Nml Nml Nml Nml Nml Nml Nml Nml N/A  Right FlexCarpiUln Nml Nml Nml Nml 1- Rapid Some 1+ Some 1+ Nml Nml N/A  Left 1stDorInt Nml Nml Nml Nml 1- Rapid Few 1+ Few 1+ Nml Nml N/A  Left  PronatorTeres Nml Nml Nml Nml Nml Nml Nml Nml Nml Nml Nml Nml N/A  Left Biceps Nml Nml Nml Nml Nml Nml Nml Nml Nml Nml Nml Nml N/A  Left Triceps Nml Nml Nml Nml Nml Nml Nml Nml Nml Nml Nml Nml N/A  Left FlexCarpiUln Nml Nml Nml Nml 1- Rapid Some 1+ Some 1+ Nml Nml N/A      Waveforms:

## 2018-02-13 DIAGNOSIS — M5412 Radiculopathy, cervical region: Secondary | ICD-10-CM | POA: Diagnosis not present

## 2018-02-26 ENCOUNTER — Telehealth: Payer: Self-pay | Admitting: Family Medicine

## 2018-02-26 NOTE — Telephone Encounter (Signed)
Autumn informed the pt to go to the ed.

## 2018-02-26 NOTE — Telephone Encounter (Signed)
Pt called and requested an appt for tomorrow or Thursday for swelling in his arm with itching and feeling warm. We are booked for today 02/26/18. I checked with a nurse and was told to inform pt he needed to go to urgent care today for those symptoms. I informed pt with those symptoms we could not schedule him that far out. Pt states he will go to urgent care tomorrow.

## 2018-02-27 DIAGNOSIS — L03114 Cellulitis of left upper limb: Secondary | ICD-10-CM | POA: Diagnosis not present

## 2018-02-27 DIAGNOSIS — T7840XA Allergy, unspecified, initial encounter: Secondary | ICD-10-CM | POA: Diagnosis not present

## 2018-02-28 DIAGNOSIS — M5413 Radiculopathy, cervicothoracic region: Secondary | ICD-10-CM | POA: Diagnosis not present

## 2018-02-28 DIAGNOSIS — M9902 Segmental and somatic dysfunction of thoracic region: Secondary | ICD-10-CM | POA: Diagnosis not present

## 2018-02-28 DIAGNOSIS — M9901 Segmental and somatic dysfunction of cervical region: Secondary | ICD-10-CM | POA: Diagnosis not present

## 2018-02-28 DIAGNOSIS — M546 Pain in thoracic spine: Secondary | ICD-10-CM | POA: Diagnosis not present

## 2018-03-04 DIAGNOSIS — M9901 Segmental and somatic dysfunction of cervical region: Secondary | ICD-10-CM | POA: Diagnosis not present

## 2018-03-04 DIAGNOSIS — M546 Pain in thoracic spine: Secondary | ICD-10-CM | POA: Diagnosis not present

## 2018-03-04 DIAGNOSIS — M5413 Radiculopathy, cervicothoracic region: Secondary | ICD-10-CM | POA: Diagnosis not present

## 2018-03-04 DIAGNOSIS — M9902 Segmental and somatic dysfunction of thoracic region: Secondary | ICD-10-CM | POA: Diagnosis not present

## 2018-03-13 DIAGNOSIS — M5413 Radiculopathy, cervicothoracic region: Secondary | ICD-10-CM | POA: Diagnosis not present

## 2018-03-13 DIAGNOSIS — M9901 Segmental and somatic dysfunction of cervical region: Secondary | ICD-10-CM | POA: Diagnosis not present

## 2018-03-13 DIAGNOSIS — M546 Pain in thoracic spine: Secondary | ICD-10-CM | POA: Diagnosis not present

## 2018-03-13 DIAGNOSIS — M9902 Segmental and somatic dysfunction of thoracic region: Secondary | ICD-10-CM | POA: Diagnosis not present

## 2018-03-18 DIAGNOSIS — M9902 Segmental and somatic dysfunction of thoracic region: Secondary | ICD-10-CM | POA: Diagnosis not present

## 2018-03-18 DIAGNOSIS — M546 Pain in thoracic spine: Secondary | ICD-10-CM | POA: Diagnosis not present

## 2018-03-18 DIAGNOSIS — M9901 Segmental and somatic dysfunction of cervical region: Secondary | ICD-10-CM | POA: Diagnosis not present

## 2018-03-18 DIAGNOSIS — M5413 Radiculopathy, cervicothoracic region: Secondary | ICD-10-CM | POA: Diagnosis not present

## 2018-03-21 DIAGNOSIS — M5413 Radiculopathy, cervicothoracic region: Secondary | ICD-10-CM | POA: Diagnosis not present

## 2018-03-21 DIAGNOSIS — M9901 Segmental and somatic dysfunction of cervical region: Secondary | ICD-10-CM | POA: Diagnosis not present

## 2018-03-21 DIAGNOSIS — M9902 Segmental and somatic dysfunction of thoracic region: Secondary | ICD-10-CM | POA: Diagnosis not present

## 2018-03-21 DIAGNOSIS — M546 Pain in thoracic spine: Secondary | ICD-10-CM | POA: Diagnosis not present

## 2018-03-25 DIAGNOSIS — M546 Pain in thoracic spine: Secondary | ICD-10-CM | POA: Diagnosis not present

## 2018-03-25 DIAGNOSIS — M9901 Segmental and somatic dysfunction of cervical region: Secondary | ICD-10-CM | POA: Diagnosis not present

## 2018-03-25 DIAGNOSIS — M9902 Segmental and somatic dysfunction of thoracic region: Secondary | ICD-10-CM | POA: Diagnosis not present

## 2018-03-25 DIAGNOSIS — M5413 Radiculopathy, cervicothoracic region: Secondary | ICD-10-CM | POA: Diagnosis not present

## 2018-03-26 DIAGNOSIS — J111 Influenza due to unidentified influenza virus with other respiratory manifestations: Secondary | ICD-10-CM | POA: Diagnosis not present

## 2018-03-28 ENCOUNTER — Encounter: Payer: Self-pay | Admitting: Family Medicine

## 2018-03-28 ENCOUNTER — Ambulatory Visit: Payer: BLUE CROSS/BLUE SHIELD | Admitting: Family Medicine

## 2018-03-28 VITALS — BP 130/82 | Ht 71.25 in | Wt 139.2 lb

## 2018-03-28 DIAGNOSIS — M9901 Segmental and somatic dysfunction of cervical region: Secondary | ICD-10-CM | POA: Diagnosis not present

## 2018-03-28 DIAGNOSIS — R21 Rash and other nonspecific skin eruption: Secondary | ICD-10-CM

## 2018-03-28 DIAGNOSIS — M546 Pain in thoracic spine: Secondary | ICD-10-CM | POA: Diagnosis not present

## 2018-03-28 DIAGNOSIS — M5413 Radiculopathy, cervicothoracic region: Secondary | ICD-10-CM | POA: Diagnosis not present

## 2018-03-28 DIAGNOSIS — M9902 Segmental and somatic dysfunction of thoracic region: Secondary | ICD-10-CM | POA: Diagnosis not present

## 2018-03-28 DIAGNOSIS — T783XXA Angioneurotic edema, initial encounter: Secondary | ICD-10-CM

## 2018-03-28 MED ORDER — HYDROXYZINE HCL 25 MG PO TABS: 25.0000 mg | ORAL_TABLET | Freq: Three times a day (TID) | ORAL | 0 refills | Status: DC | PRN

## 2018-03-28 MED ORDER — EPINEPHRINE 0.3 MG/0.3ML IJ SOAJ: 0.3000 mg | Freq: Once | INTRAMUSCULAR | 0 refills | Status: AC

## 2018-03-28 NOTE — Progress Notes (Signed)
   Subjective:    Patient ID: Daniel Burton, male    DOB: [birth year], 58 y.o.   MRN: [id number]  HPI  Patient arrives with swelling and itching that started yesterday. Patient stated this happened a month ago but went away.in the week prior to the right arm swelling and itchy one week ago, recalled no injury   Pt was given steroid and sulfa and did not take  On Sunday got a bad headache and felt bad, tried flonase  No cough, pt was having a slight headache, and did not feel the best   Had a fairly rough h a  Was given tamiflu and told might have the flu   Last night develod eye slling and lip swelling and rash on right arm an ditch and swollen, no trouble breathi    Patient also just started tamiflu for flu dx thru urgent care    Review of Systems No headache, no major weight loss or weight gain, no chest pain no back pain abdominal pain no change in bowel habits complete ROS otherwise negative     Objective:   Physical Exam  Alert vitals stable, NAD. Blood pressure good on repeat. HEENT normal. Lungs clear. Heart regular rate and rhythm. Residual erythematous patch left lateral neck right forearm.  Right upper lateral neck.  With some residual swelling     Assessment & Plan:  Impression angioedema by history.  Recent bout of flulike illness.  Curiously had another patch of erythematous swelling itchy in nature on arms 1 month ago.  Patient given steroids to complete plan.  Hydroxyzine added.  EpiPen prescribed.  Allergy referral rationale discussed

## 2018-04-01 DIAGNOSIS — M546 Pain in thoracic spine: Secondary | ICD-10-CM | POA: Diagnosis not present

## 2018-04-01 DIAGNOSIS — M5413 Radiculopathy, cervicothoracic region: Secondary | ICD-10-CM | POA: Diagnosis not present

## 2018-04-01 DIAGNOSIS — M9902 Segmental and somatic dysfunction of thoracic region: Secondary | ICD-10-CM | POA: Diagnosis not present

## 2018-04-01 DIAGNOSIS — M9901 Segmental and somatic dysfunction of cervical region: Secondary | ICD-10-CM | POA: Diagnosis not present

## 2018-04-04 ENCOUNTER — Encounter: Payer: Self-pay | Admitting: Family Medicine

## 2018-04-16 DIAGNOSIS — M9902 Segmental and somatic dysfunction of thoracic region: Secondary | ICD-10-CM | POA: Diagnosis not present

## 2018-04-16 DIAGNOSIS — M546 Pain in thoracic spine: Secondary | ICD-10-CM | POA: Diagnosis not present

## 2018-04-16 DIAGNOSIS — M5413 Radiculopathy, cervicothoracic region: Secondary | ICD-10-CM | POA: Diagnosis not present

## 2018-04-16 DIAGNOSIS — M9901 Segmental and somatic dysfunction of cervical region: Secondary | ICD-10-CM | POA: Diagnosis not present

## 2018-05-08 ENCOUNTER — Ambulatory Visit: Payer: BLUE CROSS/BLUE SHIELD | Admitting: Allergy & Immunology

## 2018-05-08 ENCOUNTER — Encounter: Payer: Self-pay | Admitting: Allergy & Immunology

## 2018-05-08 VITALS — BP 120/88 | Temp 97.8°F | Resp 16 | Ht 71.0 in | Wt 139.0 lb

## 2018-05-08 DIAGNOSIS — T7840XD Allergy, unspecified, subsequent encounter: Secondary | ICD-10-CM | POA: Diagnosis not present

## 2018-05-08 NOTE — Patient Instructions (Addendum)
1. Allergic reaction - I would like to get a couple of labs to rule out a red meat sensitivity and a mast cell disorder, but since you are not interested in doing them now, we will hold off until next time. - Anaphylaxis management plan provided and reviewed. - EpiPen is up to date.  2. Return if symptoms worsen or fail to improve.   Please inform us of any Emergency Department visits, hospitalizations, or changes in symptoms. Call us before going to the ED for breathing or allergy symptoms since we might be able to fit you in for a sick visit. Feel free to contact us anytime with any questions, problems, or concerns.  It was a pleasure to meet you today!  Websites that have reliable patient information: 1. American Academy of Asthma, Allergy, and Immunology: www.aaaai.org 2. Food Allergy Research and Education (FARE): foodallergy.org 3. Mothers of Asthmatics: http://www.asthmacommunitynetwork.org 4. American College of Allergy, Asthma, and Immunology: MonthlyElectricBill.co.uk   Make sure you are registered to vote! If you have moved or changed any of your contact information, you will need to get this updated before voting!    Voter ID laws are POSSIBLY going into effect for the General Election in November 2020! Be prepared! Check out http://levine.[email or web address] for more details.

## 2018-05-08 NOTE — Progress Notes (Signed)
NEW PATIENT  Date of Service/Encounter:  05/08/18  Referring provider: Mikey Kirschner, MD   Assessment:   Allergic reaction - patient declined labs but we did reinforce when and how to use the EpiPen  Plan/Recommendations:   1. Allergic reaction - I would like to get a couple of labs to rule out a red meat sensitivity and a mast cell disorder, but since you are not interested in doing them now, we will hold off until next time. - Anaphylaxis management plan provided and reviewed. - EpiPen is up to date.  2. Return if symptoms worsen or fail to improve.  Subjective:   Daniel Burton is a 58 y.o. male presenting today for evaluation of  Chief Complaint  Patient presents with  . Angioedema  . Pruritis    Daniel Burton has a history of the following: Patient Active Problem List   Diagnosis Date Noted  . Chronic back pain 10/29/2012    History obtained from: chart review and patient.  Daniel Burton was referred by Mikey Kirschner, MD.     Daniel Burton is a 58 y.o. male presenting for a evaluation of an allergic reaction.  His history is rather all over the place.  It seems that in December he developed right arm swelling with "bug bites" over his entire body.  He woke up in the morning with this.  They were extremely pruritic.  Evidently, his wife had a similar rash.  He went to urgent care and was placed on what sounds like a prednisone taper.  They cleared up without any problems.  And he did not take any antihistamines.  He does not think it is related to foods at all.  He eats eggs, wheat, soy, fish, and shellfish without a problem.  He does not like peanut butter.  He does eat red meat nearly daily.  He denies any recent tick bites.  He ended up attributing this to his mattress and he has since bought a new mattress with box springs.  He has not had a similar episode since December.  He was given an epinephrine autoinjector, but has not needed to use it.  He  declines any use of medications.  He does not know of any other triggers that could have led to his hives.  His wife hives have disappeared.  Otherwise, there is no history of other atopic diseases, including asthma, food allergies, drug allergies, environmental allergies, stinging insect allergies, eczema or contact dermatitis. There is no significant infectious history. Vaccinations are up to date.    Past Medical History: Patient Active Problem List   Diagnosis Date Noted  . Chronic back pain 10/29/2012    Medication List:  Allergies as of 05/08/2018   No Known Allergies     Medication List       Accurate as of May 08, 2018  2:52 PM. Always use your most recent med list.        cefPROZIL 500 MG tablet Commonly known as:  CEFZIL Take 1 tablet (500 mg total) by mouth 2 (two) times daily.   doxycycline 100 MG tablet Commonly known as:  VIBRA-TABS Take 1 tablet (100 mg total) by mouth 2 (two) times daily.   EPINEPHrine 0.3 mg/0.3 mL Soaj injection Commonly known as:  EPI-PEN INJECT 0.3 MLS (0.3 MG TOTAL) INTO THE MUSCLE ONCE FOR 1 DOSE. FOR ALLERGIC REACTION   HYDROcodone-acetaminophen 5-325 MG tablet Commonly known as:  NORCO/VICODIN Take 1 tablet by mouth every  4 (four) hours as needed.   hydrOXYzine 25 MG tablet Commonly known as:  ATARAX/VISTARIL Take 1 tablet (25 mg total) by mouth 3 (three) times daily as needed.   naproxen 500 MG tablet Commonly known as:  NAPROSYN Take 1 tablet (500 mg total) by mouth 2 (two) times daily with a meal.   pantoprazole 40 MG tablet Commonly known as:  PROTONIX Take 1 tablet (40 mg total) by mouth daily.   penicillin v potassium 500 MG tablet Commonly known as:  VEETID Take 1 tablet (500 mg total) by mouth 4 (four) times daily.       Birth History: non-contributory  Developmental History: non-contributory.   Past Surgical History: Past Surgical History:  Procedure Laterality Date  . ELBOW SURGERY Right   .  HERNIA REPAIR       Family History: Family History  Problem Relation Age of Onset  . Allergic rhinitis Neg Hx   . Angioedema Neg Hx   . Asthma Neg Hx   . Urticaria Neg Hx      Social History: Daniel Burton lives at home with his wife.  They live in a house that is 91 years old.  There is carpeting throughout the home.  He has gas heating and central cooling.  He does have dust mite covers on his bedding.  There is tobacco exposure.  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   Review of Systems  Constitutional: Negative.  Negative for fever, malaise/fatigue and weight loss.  HENT: Negative.  Negative for congestion, ear discharge and ear pain.   Eyes: Negative for pain, discharge and redness.  Respiratory: Negative for cough, sputum production, shortness of breath and wheezing.   Cardiovascular: Negative.  Negative for chest pain and palpitations.  Gastrointestinal: Negative for abdominal pain and heartburn.  Skin: Negative.  Negative for itching and rash.  Neurological: Negative for dizziness and headaches.  Endo/Heme/Allergies: Negative for environmental allergies. Does not bruise/bleed easily.       Objective:   Blood pressure 120/88, temperature 97.8 F (36.6 C), temperature source Oral, resp. rate 16, height 5\' 11"  (1.803 m), weight 139 lb (63 kg). Body mass index is 19.39 kg/m.   Physical Exam:   Physical Exam  Constitutional: He appears well-developed.  HENT:  Head: Normocephalic and atraumatic.  Right Ear: Tympanic membrane, external ear and ear canal normal. No drainage, swelling or tenderness. Tympanic membrane is not injected, not scarred, not erythematous, not retracted and not bulging.  Left Ear: Tympanic membrane, external ear and ear canal normal. No drainage, swelling or tenderness. Tympanic membrane is not injected, not scarred, not erythematous, not retracted and not bulging.  Nose: No mucosal edema, rhinorrhea, nasal deformity or  septal deviation. No epistaxis. Right sinus exhibits no maxillary sinus tenderness and no frontal sinus tenderness. Left sinus exhibits no maxillary sinus tenderness and no frontal sinus tenderness.  Mouth/Throat: Uvula is midline and oropharynx is clear and moist. Mucous membranes are not pale and not dry.  Eyes: Pupils are equal, round, and reactive to light. Conjunctivae and EOM are normal. Right eye exhibits no chemosis and no discharge. Left eye exhibits no chemosis and no discharge. Right conjunctiva is not injected. Left conjunctiva is not injected.  Cardiovascular: Normal rate, regular rhythm and normal heart sounds.  Respiratory: Effort normal and breath sounds normal. No accessory muscle usage. No tachypnea. He has no wheezes. He has no rhonchi. He has no rales. He exhibits no tenderness.  GI: There is  no abdominal tenderness. There is no rebound and no guarding.  Lymphadenopathy:       Head (right side): No submandibular, no tonsillar and no occipital adenopathy present.       Head (left side): No submandibular, no tonsillar and no occipital adenopathy present.    He has no cervical adenopathy.  Neurological: He is alert.  Skin: No abrasion, no petechiae and no rash noted. Rash is not papular, not vesicular and not urticarial. No erythema. No pallor.  There are no urticarial lesions noted.  Psychiatric: He has a normal mood and affect.     Diagnostic studies: none     Salvatore Marvel, MD [facility]

## 2018-07-08 DIAGNOSIS — M545 Low back pain: Secondary | ICD-10-CM | POA: Diagnosis not present

## 2018-07-08 DIAGNOSIS — M9901 Segmental and somatic dysfunction of cervical region: Secondary | ICD-10-CM | POA: Diagnosis not present

## 2018-07-08 DIAGNOSIS — M5413 Radiculopathy, cervicothoracic region: Secondary | ICD-10-CM | POA: Diagnosis not present

## 2018-07-08 DIAGNOSIS — M9903 Segmental and somatic dysfunction of lumbar region: Secondary | ICD-10-CM | POA: Diagnosis not present

## 2018-07-10 DIAGNOSIS — M5413 Radiculopathy, cervicothoracic region: Secondary | ICD-10-CM | POA: Diagnosis not present

## 2018-07-10 DIAGNOSIS — M9901 Segmental and somatic dysfunction of cervical region: Secondary | ICD-10-CM | POA: Diagnosis not present

## 2018-07-10 DIAGNOSIS — M545 Low back pain: Secondary | ICD-10-CM | POA: Diagnosis not present

## 2018-07-10 DIAGNOSIS — M9903 Segmental and somatic dysfunction of lumbar region: Secondary | ICD-10-CM | POA: Diagnosis not present

## 2018-07-15 DIAGNOSIS — M5413 Radiculopathy, cervicothoracic region: Secondary | ICD-10-CM | POA: Diagnosis not present

## 2018-07-15 DIAGNOSIS — M9903 Segmental and somatic dysfunction of lumbar region: Secondary | ICD-10-CM | POA: Diagnosis not present

## 2018-07-15 DIAGNOSIS — M545 Low back pain: Secondary | ICD-10-CM | POA: Diagnosis not present

## 2018-07-15 DIAGNOSIS — M9901 Segmental and somatic dysfunction of cervical region: Secondary | ICD-10-CM | POA: Diagnosis not present

## 2018-09-02 DIAGNOSIS — M9901 Segmental and somatic dysfunction of cervical region: Secondary | ICD-10-CM | POA: Diagnosis not present

## 2018-09-02 DIAGNOSIS — M9903 Segmental and somatic dysfunction of lumbar region: Secondary | ICD-10-CM | POA: Diagnosis not present

## 2018-09-02 DIAGNOSIS — M542 Cervicalgia: Secondary | ICD-10-CM | POA: Diagnosis not present

## 2018-09-02 DIAGNOSIS — M545 Low back pain: Secondary | ICD-10-CM | POA: Diagnosis not present

## 2018-09-06 DIAGNOSIS — M545 Low back pain: Secondary | ICD-10-CM | POA: Diagnosis not present

## 2018-09-06 DIAGNOSIS — M542 Cervicalgia: Secondary | ICD-10-CM | POA: Diagnosis not present

## 2018-09-06 DIAGNOSIS — M9901 Segmental and somatic dysfunction of cervical region: Secondary | ICD-10-CM | POA: Diagnosis not present

## 2018-09-06 DIAGNOSIS — M9903 Segmental and somatic dysfunction of lumbar region: Secondary | ICD-10-CM | POA: Diagnosis not present

## 2018-09-10 ENCOUNTER — Ambulatory Visit (INDEPENDENT_AMBULATORY_CARE_PROVIDER_SITE_OTHER): Payer: BC Managed Care – PPO | Admitting: Family Medicine

## 2018-09-10 ENCOUNTER — Other Ambulatory Visit: Payer: Self-pay

## 2018-09-10 DIAGNOSIS — M5442 Lumbago with sciatica, left side: Secondary | ICD-10-CM

## 2018-09-10 NOTE — Progress Notes (Signed)
   Subjective:    Patient ID: ILIR MAHRT, male    DOB: 10-11-1960, 58 y.o.   MRN: [id number] Audio plus video HPI  Patient calls with back pain for 2 weeks. Patient states he recalls no injury and just woke up with pain in his lower back  Virtual Visit via Video Note  I connected with VONDELL BABERS on 09/10/18 at  1:40 PM EDT by a video enabled telemedicine application and verified that I am speaking with the correct person using two identifiers.  Location: Patient: home Provider: office   I discussed the limitations of evaluation and management by telemedicine and the availability of in person appointments. The patient expressed understanding and agreed to proceed.  History of Present Illness:    Observations/Objective:   Assessment and Plan:   Follow Up Instructions:    I discussed the assessment and treatment plan with the patient. The patient was provided an opportunity to ask questions and all were answered. The patient agreed with the plan and demonstrated an understanding of the instructions.   The patient was advised to call back or seek an in-person evaluation if the symptoms worsen or if the condition fails to improve as anticipated.  I provided 18 minutes of non-face-to-face time during this encounter.    Patient called back after being worked up and rescheduled virtual visit before speaking to the doctor   Review of Systems No headache, no major weight loss or weight gain, no chest pain no abdominal pain no change in bowel habits complete ROS otherwise negative     Objective:   Physical Exam  Virtual      Assessment & Plan:  Probable lumbar strain with elements of nerve root irritation.  Discussed prednisone taper.  Pain medication discussed.  Appropriate exercises discussed

## 2018-09-11 ENCOUNTER — Ambulatory Visit (INDEPENDENT_AMBULATORY_CARE_PROVIDER_SITE_OTHER): Payer: BC Managed Care – PPO | Admitting: Family Medicine

## 2018-09-11 ENCOUNTER — Encounter: Payer: Self-pay | Admitting: Family Medicine

## 2018-09-11 ENCOUNTER — Other Ambulatory Visit: Payer: Self-pay

## 2018-09-11 DIAGNOSIS — M542 Cervicalgia: Secondary | ICD-10-CM | POA: Diagnosis not present

## 2018-09-11 DIAGNOSIS — M9901 Segmental and somatic dysfunction of cervical region: Secondary | ICD-10-CM | POA: Diagnosis not present

## 2018-09-11 DIAGNOSIS — S39012A Strain of muscle, fascia and tendon of lower back, initial encounter: Secondary | ICD-10-CM

## 2018-09-11 DIAGNOSIS — M545 Low back pain: Secondary | ICD-10-CM | POA: Diagnosis not present

## 2018-09-11 DIAGNOSIS — M9903 Segmental and somatic dysfunction of lumbar region: Secondary | ICD-10-CM | POA: Diagnosis not present

## 2018-09-11 MED ORDER — PREDNISONE 20 MG PO TABS: ORAL_TABLET | ORAL | 0 refills | Status: DC

## 2018-09-11 MED ORDER — HYDROCODONE-ACETAMINOPHEN 5-325 MG PO TABS: 1.0000 | ORAL_TABLET | Freq: Four times a day (QID) | ORAL | 0 refills | Status: DC | PRN

## 2018-09-11 NOTE — Progress Notes (Signed)
   Subjective:    Patient ID: Daniel Burton, male    DOB: November 18, 1960, 58 y.o.   MRN: [id number] Audio plus visual HPI   Patient calls with back pain for 2 weeks. Patient states he recalls no injury and just woke up with pain in his lower back   Virtual Visit via Video Note   I connected with DORNELL GRASMICK on 09/10/18 at  1:40 PM EDT by a video enabled telemedicine application and verified that I am speaking with the correct person using two identifiers.   Location: Patient: home Provider: office   I discussed the limitations of evaluation and management by telemedicine and the availability of in person appointments. The patient expressed understanding and agreed to proceed.   History of Present Illness:     Observations/Objective:     Assessment and Plan:     Follow Up Instructions:     I discussed the assessment and treatment plan with the patient. The patient was provided an opportunity to ask questions and all were answered. The patient agreed with the plan and demonstrated an understanding of the instructions.   The patient was advised to call back or seek an in-person evaluation if the symptoms worsen or if the condition fails to improve as anticipated.   I provided18 minutes of non-face-to-face time during this encounter  Pain fairly severe at times.  Right lower lumbar region.  Worse with movements.  Radiating down the back of thigh.  At times tooth achy in nature.  Recalls no sudden injury  Review of Systems No headache, no major weight loss or weight gain, no chest pain no back pain abdominal pain no change in bowel habits complete ROS otherwise negative     Objective:   Physical Exam  Virtual      Assessment & Plan:  Impression 1 subacute lumbar strain with secondary nerve root irritation.  Discussed.  Prednisone taper.  Symptom care discussed warning signs discussed hydrocodone as needed for more severe pain

## 2018-09-21 ENCOUNTER — Encounter: Payer: Self-pay | Admitting: Family Medicine

## 2018-10-03 ENCOUNTER — Other Ambulatory Visit: Payer: Self-pay | Admitting: Family Medicine

## 2018-10-03 ENCOUNTER — Telehealth: Payer: Self-pay | Admitting: Family Medicine

## 2018-10-03 NOTE — Telephone Encounter (Signed)
He can do 1 more taper of prednisone but if that does not adequately treat his problem then the next step would be for the patient to do a follow-up visit with Dr. Richardson Landry  Prednisone, 20 mg, #18,3qd for 3d then 2qd for 3d then 1qd for 3d

## 2018-10-03 NOTE — Telephone Encounter (Signed)
Pt wants refill on predniSONE (DELTASONE) 20 MG tablet. He states its for back pain   [id number] - [place], [place] - [address]

## 2018-10-03 NOTE — Telephone Encounter (Signed)
Prescription sent electronically to pharmacy. Left message to return call to notify patient. 

## 2018-10-03 NOTE — Telephone Encounter (Signed)
Patient notified

## 2018-10-04 DIAGNOSIS — M545 Low back pain: Secondary | ICD-10-CM | POA: Diagnosis not present

## 2018-10-04 DIAGNOSIS — M542 Cervicalgia: Secondary | ICD-10-CM | POA: Diagnosis not present

## 2018-10-04 DIAGNOSIS — M9901 Segmental and somatic dysfunction of cervical region: Secondary | ICD-10-CM | POA: Diagnosis not present

## 2018-10-04 DIAGNOSIS — M9903 Segmental and somatic dysfunction of lumbar region: Secondary | ICD-10-CM | POA: Diagnosis not present

## 2018-10-14 ENCOUNTER — Telehealth: Payer: Self-pay | Admitting: Orthopedic Surgery

## 2018-10-14 NOTE — Telephone Encounter (Signed)
Patient stopped into office inquiring about scheduling appointment for neck/shoulder pain. Discussed provider(s) he has seen for this issue. Notes in chart also indicate patient has been treated by orthopaedic hand specialist, Dr Leanora Cover in [place].  Patient states he requested notes from [facility], where he was most recently seen and Xrayed. Appointment pending notes, which Dr Aline Brochure will review and advise. Patient voiced understanding.

## 2018-10-16 NOTE — Telephone Encounter (Signed)
Records from [facility] received; Dr Aline Brochure to review; patient aware.

## 2018-10-16 NOTE — Telephone Encounter (Signed)
Dr Aline Brochure, please review and advise regarding referral for problem neck and back pain radiating to right arm with tingling and numbness, per Dr Gypsy Decant. Per [facility] Epic chart, notes indicate patient has been seen by other providers for (?)related medical issues. Dr Tanna Furry notes are in Dr box.

## 2018-10-16 NOTE — Telephone Encounter (Signed)
I can eval but I will send him to neurosurgery //tell him we dont treat these types of problems;  If his pmd gets an mri we can see him

## 2018-10-18 NOTE — Telephone Encounter (Signed)
Called back to patient; relayed per Dr Ruthe Mannan review of Dr Tanna Furry notes, to see primary care provider, for MRI, first. Patient did not voice understanding - said wants to speak with Dr Aline Brochure personally. I relayed I will let our supervisor know. Patient said he feels sure Dr Aline Brochure will see him, and wants to come by personally next Monday or Wednesday.

## 2018-10-23 NOTE — Telephone Encounter (Signed)
Patient came by the office asking to speak with Dr Aline Brochure, no appt.  He is wanting to speak with him about his neck/arm pain.  I advised patient again that Dr Aline Brochure has reviewed his records and has recommended him to see his PCP and obtain an MRI of the Cspine.  Patient understands but is hesitant to proceed with this he says. I assured him again that Dr Aline Brochure has indeed reviewed the notes and this is what he recommends. Patient understands, he mentions that he has an appt with his PCP on Monday 10/25/18.

## 2018-10-25 ENCOUNTER — Encounter: Payer: Self-pay | Admitting: Radiology

## 2018-10-28 ENCOUNTER — Encounter: Payer: Self-pay | Admitting: Family Medicine

## 2018-10-28 ENCOUNTER — Other Ambulatory Visit: Payer: Self-pay

## 2018-10-28 ENCOUNTER — Ambulatory Visit (INDEPENDENT_AMBULATORY_CARE_PROVIDER_SITE_OTHER): Payer: BC Managed Care – PPO | Admitting: Family Medicine

## 2018-10-28 DIAGNOSIS — M5412 Radiculopathy, cervical region: Secondary | ICD-10-CM | POA: Diagnosis not present

## 2018-10-28 MED ORDER — PREDNISONE 20 MG PO TABS: ORAL_TABLET | ORAL | 0 refills | Status: DC

## 2018-10-28 NOTE — Progress Notes (Signed)
   Subjective:  Audio plus video  Patient ID: Daniel Burton, male    DOB: 1961-03-26, 58 y.o.   MRN: [id number]  Neck Pain  This is a new problem. Episode onset: neck pain going on for a couple months. The pain is present in the right side. The quality of the pain is described as aching. Stiffness is present: only bothers when pt is working. He has tried chiropractic manipulation for the symptoms. The treatment provided mild relief.   Pt was seeing chiropractor and it would help but the pain would come back. Chiropractor referred him to orthopedics in [place]. Pt states he then switched to [place] and pt states he may need a MRI. Pt seems to thinks it is a pinched nerve.   Pt states the pain radiates to both arms and back   Virtual Visit via Video Note  I connected with Daniel Burton on 10/28/18 at  9:30 AM EDT by a video enabled telemedicine application and verified that I am speaking with the correct person using two identifiers.  Location: Patient: home Provider: office   I discussed the limitations of evaluation and management by telemedicine and the availability of in person appointments. The patient expressed understanding and agreed to proceed.  History of Present Illness:    Observations/Objective:   Assessment and Plan:   Follow Up Instructions:    I discussed the assessment and treatment plan with the patient. The patient was provided an opportunity to ask questions and all were answered. The patient agreed with the plan and demonstrated an understanding of the instructions.   The patient was advised to call back or seek an in-person evaluation if the symptoms worsen or if the condition fails to improve as anticipated.  I provide 20 minutes of non-face-to-face time during this encounter.  Patient has had progressive cervical neck pain.  6 weeks in duration.  Severe in nature.  Chiropractor did not help much.  Saw tried to see an orthopedist who demanded an MRI  done by the primary care clinician of course.  Pain and discomfort has been radiating from the neck all the way down to the arm and right hand.  Accompanied by tingling.  Severe at times.  Worse with cough worse with certain motion.  Worse when working. Vicente Males, LPN  Review of Systems  Musculoskeletal: Positive for neck pain.       Objective:   Physical Exam  Virtual      Assessment & Plan:  Impression cervical pain plus neuropathic features severe nature.  In 6 weeks duration or greater disabling discomfort at times.  Will do prednisone taper.  If continues after this we will schedule MRI

## 2019-01-10 ENCOUNTER — Other Ambulatory Visit: Payer: Self-pay

## 2019-01-10 ENCOUNTER — Encounter: Payer: Self-pay | Admitting: Family Medicine

## 2019-01-10 ENCOUNTER — Ambulatory Visit (INDEPENDENT_AMBULATORY_CARE_PROVIDER_SITE_OTHER): Payer: BC Managed Care – PPO | Admitting: Family Medicine

## 2019-01-10 ENCOUNTER — Ambulatory Visit: Payer: Self-pay | Admitting: Orthopedic Surgery

## 2019-01-10 VITALS — BP 132/80 | Temp 97.2°F | Ht 71.0 in | Wt 134.0 lb

## 2019-01-10 DIAGNOSIS — Z79899 Other long term (current) drug therapy: Secondary | ICD-10-CM | POA: Diagnosis not present

## 2019-01-10 DIAGNOSIS — Z125 Encounter for screening for malignant neoplasm of prostate: Secondary | ICD-10-CM

## 2019-01-10 DIAGNOSIS — G542 Cervical root disorders, not elsewhere classified: Secondary | ICD-10-CM

## 2019-01-10 DIAGNOSIS — M542 Cervicalgia: Secondary | ICD-10-CM

## 2019-01-10 DIAGNOSIS — Z13220 Encounter for screening for lipoid disorders: Secondary | ICD-10-CM | POA: Diagnosis not present

## 2019-01-10 MED ORDER — PREDNISONE 20 MG PO TABS: ORAL_TABLET | ORAL | 0 refills | Status: DC

## 2019-01-10 NOTE — Progress Notes (Signed)
   Subjective:  In person  Patient ID: Daniel Burton, male    DOB: Aug 05, 1960, 58 y.o.   MRN: [id number]  HPItingling in hands and feet. Been going on for a few months.pt iexperiencing numbness of fingers in the left hand   Ongoing severe neck pain  Steroids emporarily helped and then return   Still working   Works as a Designer, multimedia    Coughing and or sneezing, develops sig worsening into the arm   Please see prior notes.  Prednisone did help temporarily then patient's discomfort and pain return.  Also feels weak at times in the left hand.  When he coughs and sneezes has a shooting bad pain go down his arm.  Notes tingling and numbness primarily in the ulnar position of his left hand.  Pain deep in the neck.   Review of Systems No headache, no major weight loss or weight gain, no chest pain no back pain abdominal pain no change in bowel habits complete ROS otherwise negative     Objective:   Physical Exam  Alert and oriented, vitals reviewed and stable, NAD ENT-TM's and ext canals WNL bilat via otoscopic exam Soft palate, tonsils and post pharynx WNL via oropharyngeal exam Neck-symmetric, no masses; thyroid nonpalpable and nontender Pulmonary-no tachypnea or accessory muscle use; Clear without wheezes via auscultation Card--no abnrml murmurs, rhythm reg and rate WNL Carotid pulses symmetric, without bruits Positive significant neck pain with lateral rotation left hand grip slightly diminished.  Ulnar pulses seem somewhat diminished.  Sensitivity on R side somewhat diminished.      Assessment & Plan:  Impression cervical neuropathy.  Rationale discussed.  Made distinctly worse with coughing and sneezing which is fairly classic.  Had improvement with steroids now back more serious than before.  This is been going on several months.  Patient is at his wits end about needing to get on with intervening with this.  MRI ordered rationale discussed.  1 more prednisone taper

## 2019-01-21 ENCOUNTER — Other Ambulatory Visit: Payer: Self-pay | Admitting: *Deleted

## 2019-01-21 DIAGNOSIS — G542 Cervical root disorders, not elsewhere classified: Secondary | ICD-10-CM

## 2019-01-21 DIAGNOSIS — M542 Cervicalgia: Secondary | ICD-10-CM

## 2019-02-08 ENCOUNTER — Other Ambulatory Visit: Payer: Self-pay

## 2019-02-08 ENCOUNTER — Ambulatory Visit
Admission: RE | Admit: 2019-02-08 | Discharge: 2019-02-08 | Disposition: A | Discharge status: Home / Self Care | Payer: Self-pay | Source: Ambulatory Visit | Attending: Family Medicine | Admitting: Family Medicine

## 2019-02-08 DIAGNOSIS — M4802 Spinal stenosis, cervical region: Secondary | ICD-10-CM | POA: Diagnosis not present

## 2019-02-08 DIAGNOSIS — M542 Cervicalgia: Secondary | ICD-10-CM

## 2019-02-08 DIAGNOSIS — G542 Cervical root disorders, not elsewhere classified: Secondary | ICD-10-CM

## 2019-02-08 IMAGING — MR MR CERVICAL SPINE W/O CM
6 series · 40 of 48 positions shown · non-contrast
Comparison: Cervical radiographs [DATE]

CLINICAL DATA: Neck pain. Cervical neuropathy. Numbness and
tingling in legs and fingers. Neck stiffness.

EXAM:
MRI CERVICAL SPINE WITHOUT CONTRAST
TECHNIQUE: Multiplanar, multisequence MR imaging of the cervical spine was
performed. No intravenous contrast was administered.

[Series 2: T2 · sagittal · 3.0mm · 0.41mm/px · 5 of 13 slices shown (1 of 3)]
[im 1/13]
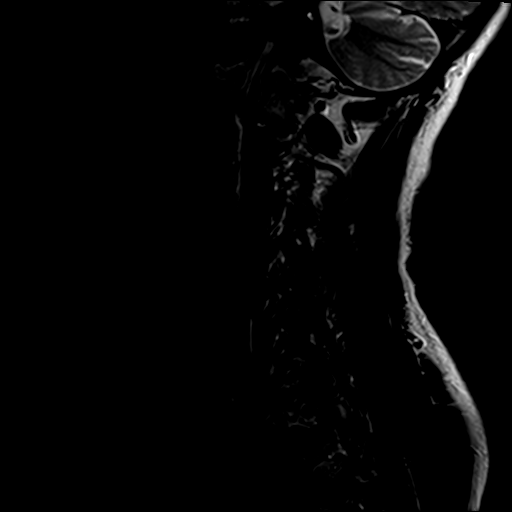
[im 4/13]
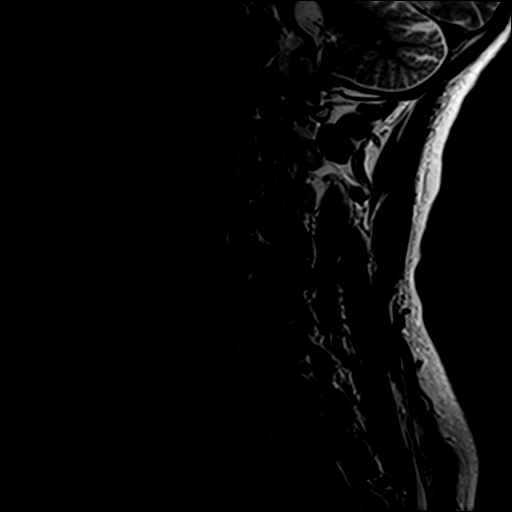
[im 7/13]
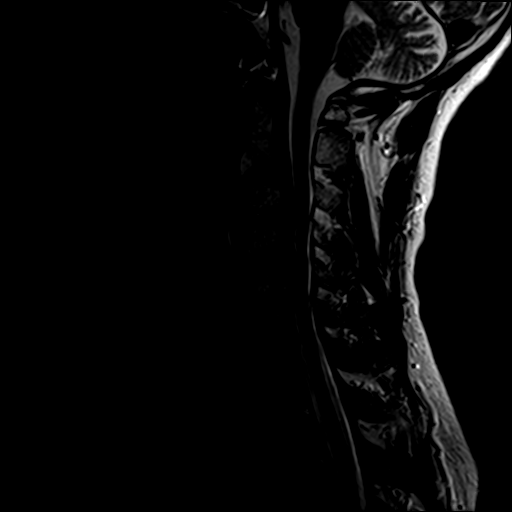
[im 10/13]
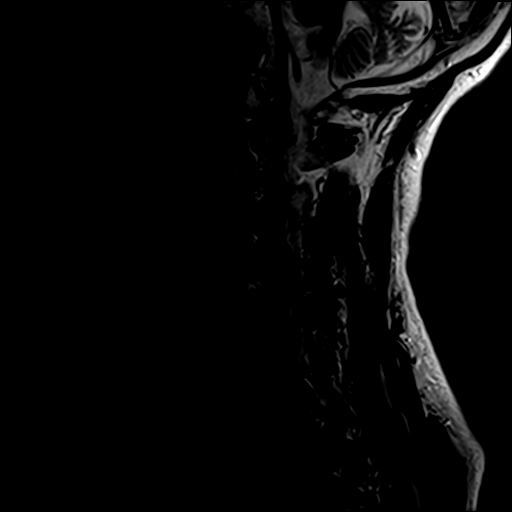
[im 13/13]
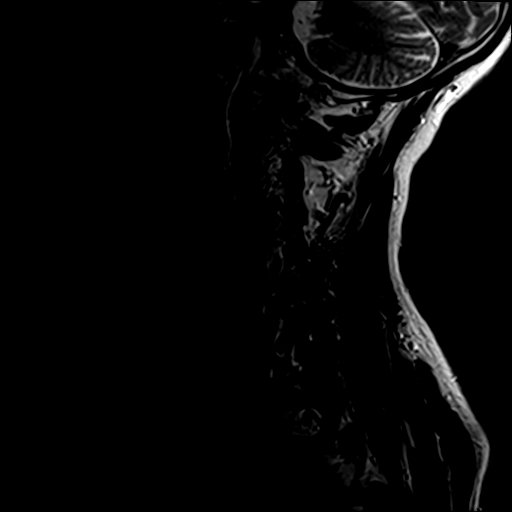

[Series 3: T1 · sagittal · 3.0mm · 0.41mm/px · 5 of 13 slices shown]
[im 1/13]
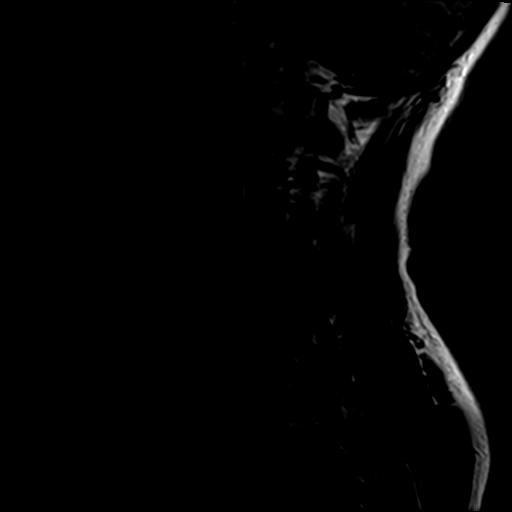
[im 4/13]
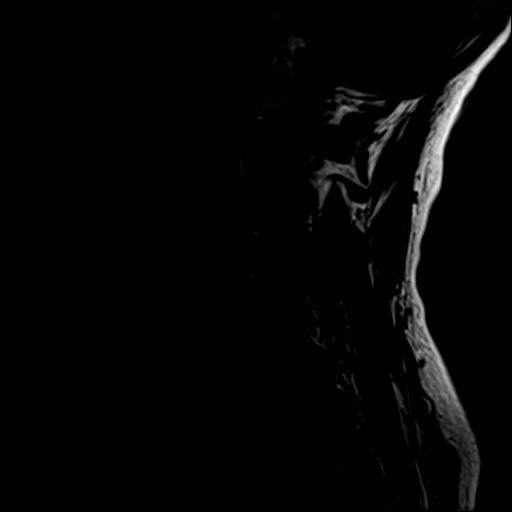
[im 7/13]
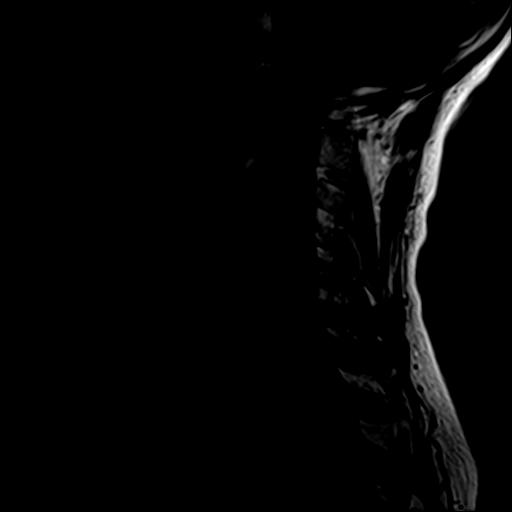
[im 10/13]
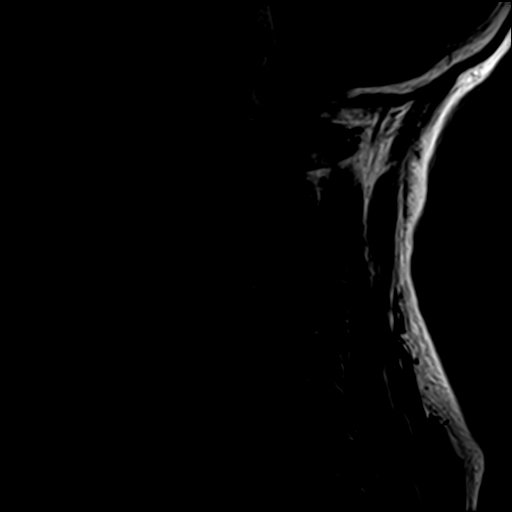
[im 13/13]
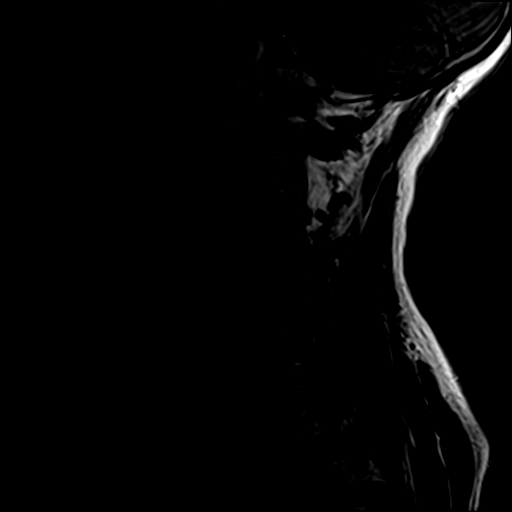

[Series 4: STIR · sagittal · 3.0mm · 0.82mm/px · 5 of 13 slices shown]
[im 1/13]
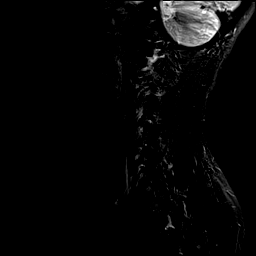
[im 4/13]
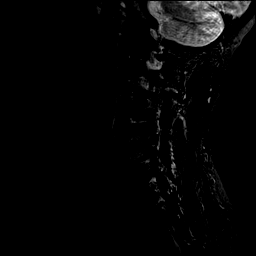
[im 7/13]
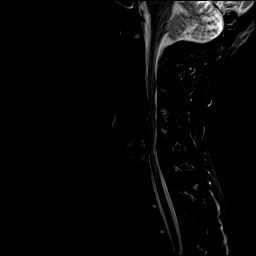
[im 10/13]
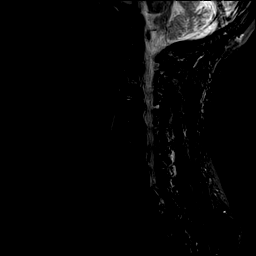
[im 13/13]
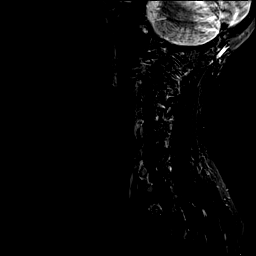

[Series 5: GRE · axial · 3.0mm · 0.35mm/px · z∈[-74,-45]mm · 3 of 29 slices shown]
[im 1/29]
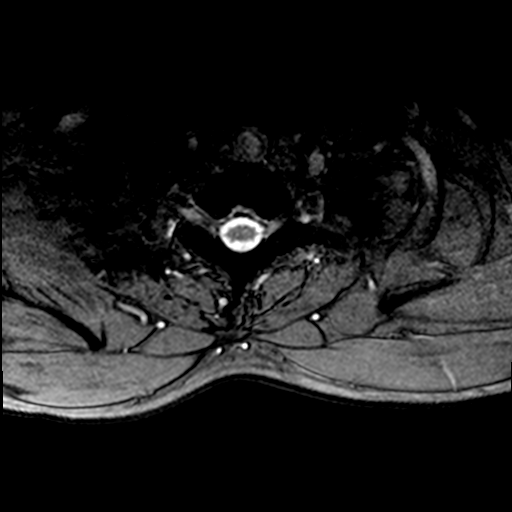
[im 6/29]
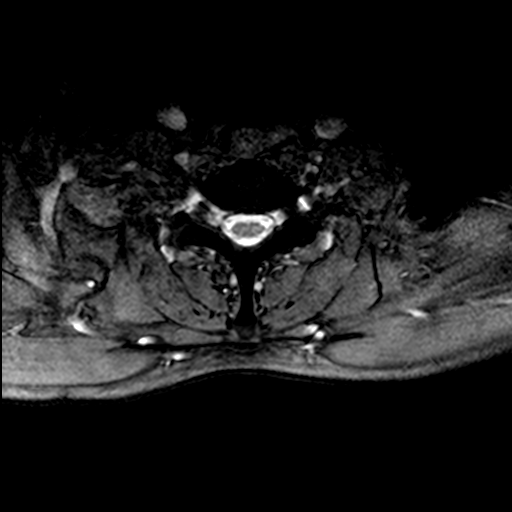
[im 9/29]
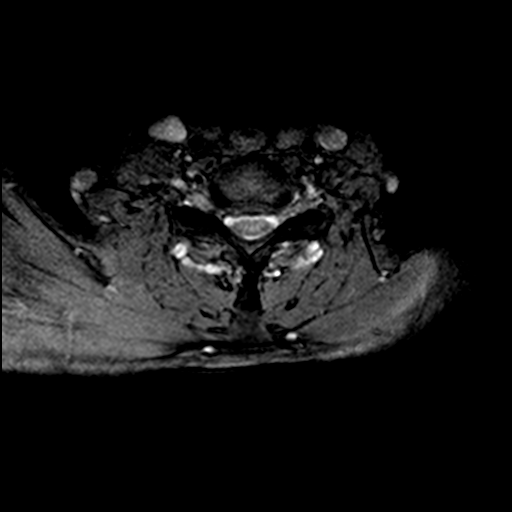

[Series 6: T2 · axial · 3.0mm · 0.94mm/px · z∈[-74,+28]mm · 11 of 28 slices shown (2 of 3)]
[im 1/28]
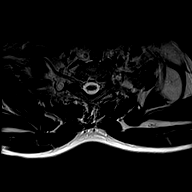
[im 3/28]
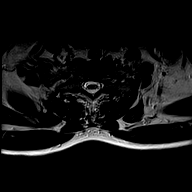
[im 6/28]
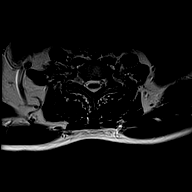
[im 9/28]
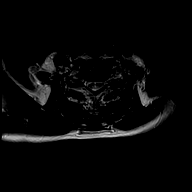
[im 11/28]
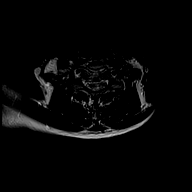
[im 14/28]
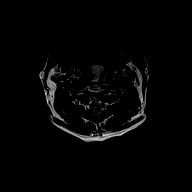
[im 17/28]
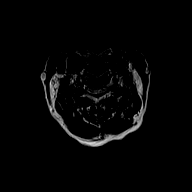
[im 19/28]
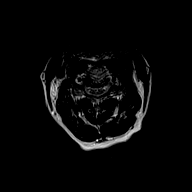
[im 22/28]
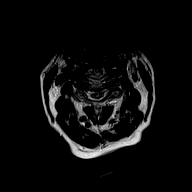
[im 25/28]
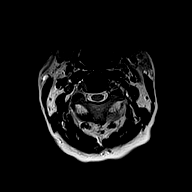
[im 28/28]
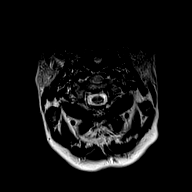

[Series 7: T2 · axial · 3.0mm · 0.94mm/px · z∈[-74,+28]mm · 11 of 29 slices shown (3 of 3)]
[im 1/29]
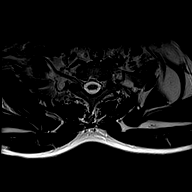
[im 3/29]
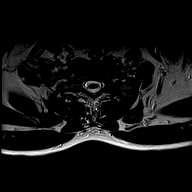
[im 6/29]
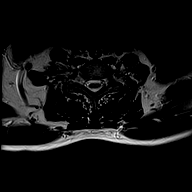
[im 9/29]
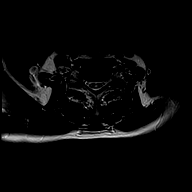
[im 12/29]
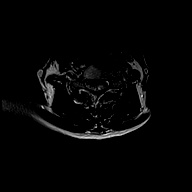
[im 15/29]
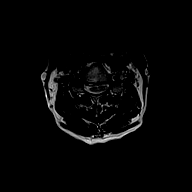
[im 17/29]
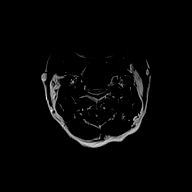
[im 20/29]
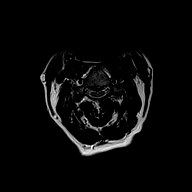
[im 23/29]
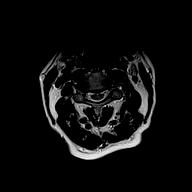
[im 26/29]
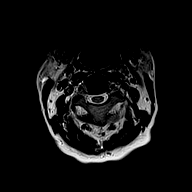
[im 29/29]
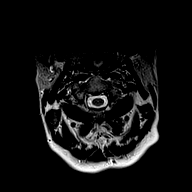

[40 of 48 positions shown; findings below may reference images not displayed]

FINDINGS: Alignment: Mild retrolisthesis C3-4, C4-5, C5-6. Straightening of
the cervical lordosis.

Vertebrae: Negative for fracture or mass.  Discogenic edema at C5-6.

Cord: Cord evaluation limited by motion. There is a cord flattening
due to spinal stenosis at C3-4, C4-5, C5-6. Cord hyperintensity is
present in the cord at C4-5 and C5-6 bilaterally with central
hyperintensity in the cord at C6-7.

Posterior Fossa, vertebral arteries, paraspinal tissues: Negative

Disc levels:

C2-3: Negative

C3-4: Moderate disc degeneration with diffuse endplate spurring.
Cord flattening with moderate to severe spinal stenosis. Moderate
right foraminal stenosis due to spurring. Mild left foraminal
stenosis.

C4-5: Disc degeneration and spondylosis with diffuse uncinate
spurring. Cord flattening with moderate to severe spinal stenosis.
Cord hyperintensity. Mild foraminal stenosis bilaterally.

C5-6: Disc degeneration and spondylosis. Prominent right-sided disc
and osteophyte complex causing right foraminal encroachment. Cord
flattening with moderate to severe spinal stenosis. Cord
hyperintensity. Moderate left foraminal stenosis

C6-7: Broad-based central disc protrusion and spurring. Mild spinal
stenosis and mild foraminal stenosis bilaterally

C7-T1: Mild disc degeneration. Mild facet degeneration. Negative for
spinal or foraminal stenosis
IMPRESSION: C3-4 spondylosis with cord flattening and moderate to severe spinal
stenosis. Moderate right foraminal stenosis

C4-5 spondylosis with cord flattening and moderate to severe spinal
stenosis. Cord hyperintensity bilaterally with the appearance of
chronic compressive myelopathy.

Disc degeneration and spondylosis C5-6. Prominent right-sided disc
and osteophyte complex causing right foraminal encroachment.
Moderate to severe spinal stenosis with bilateral cord
hyperintensity consistent with chronic compressive myelopathy

Mild spinal stenosis and mild foraminal stenosis bilaterally C6-7
due to spurring.

These results will be called to the ordering clinician or
representative by the Radiologist Assistant, and communication
documented in the PACS or zVision Dashboard.

## 2019-02-10 ENCOUNTER — Other Ambulatory Visit: Payer: Self-pay | Admitting: *Deleted

## 2019-02-10 DIAGNOSIS — M5412 Radiculopathy, cervical region: Secondary | ICD-10-CM

## 2019-02-13 DIAGNOSIS — M4802 Spinal stenosis, cervical region: Secondary | ICD-10-CM | POA: Insufficient documentation

## 2019-02-13 DIAGNOSIS — G992 Myelopathy in diseases classified elsewhere: Secondary | ICD-10-CM | POA: Diagnosis not present

## 2019-02-18 ENCOUNTER — Telehealth: Payer: Self-pay | Admitting: Family Medicine

## 2019-02-18 DIAGNOSIS — Z125 Encounter for screening for malignant neoplasm of prostate: Secondary | ICD-10-CM | POA: Diagnosis not present

## 2019-02-18 DIAGNOSIS — Z79899 Other long term (current) drug therapy: Secondary | ICD-10-CM | POA: Diagnosis not present

## 2019-02-18 DIAGNOSIS — Z13220 Encounter for screening for lipoid disorders: Secondary | ICD-10-CM | POA: Diagnosis not present

## 2019-02-18 DIAGNOSIS — Z139 Encounter for screening, unspecified: Secondary | ICD-10-CM | POA: Diagnosis not present

## 2019-02-18 NOTE — Telephone Encounter (Signed)
[facility] contacted and test added on. Pt contacted and verbalized understanding

## 2019-02-18 NOTE — Telephone Encounter (Signed)
Sure order it(but I will discourgae any rx if low)

## 2019-02-18 NOTE — Telephone Encounter (Signed)
Pt has PSA, LIPID, HEPATIC AND MET 7 drew yesterday. Please advise. Thank you

## 2019-02-18 NOTE — Telephone Encounter (Signed)
Pt would like to have testosterone level checked as well. He had lab work done today and lab drew and extra tube so order could be added.

## 2019-02-19 LAB — BASIC METABOLIC PANEL
BUN/Creatinine Ratio: 11 (ref 9–20)
BUN: 10 mg/dL (ref 6–24)
CO2: 27 mmol/L (ref 20–29)
Calcium: 9 mg/dL (ref 8.7–10.2)
Chloride: 105 mmol/L (ref 96–106)
Creatinine, Ser: 0.87 mg/dL (ref 0.76–1.27)
GFR calc Af Amer: 111 mL/min/{1.73_m2} (ref >59)
GFR calc non Af Amer: 96 mL/min/{1.73_m2} (ref >59)
Glucose: 108 mg/dL — ABNORMAL HIGH (ref 65–99)
Potassium: 4.2 mmol/L (ref 3.5–5.2)
Sodium: 145 mmol/L — ABNORMAL HIGH (ref 134–144)

## 2019-02-19 LAB — LIPID PANEL
Chol/HDL Ratio: 3.7 ratio (ref 0.0–5.0)
Cholesterol, Total: 265 mg/dL — ABNORMAL HIGH (ref 100–199)
HDL: 72 mg/dL (ref >39)
LDL Chol Calc (NIH): 180 mg/dL — ABNORMAL HIGH (ref 0–99)
Triglycerides: 81 mg/dL (ref 0–149)
VLDL Cholesterol Cal: 13 mg/dL (ref 5–40)

## 2019-02-19 LAB — HEPATIC FUNCTION PANEL
ALT: 46 IU/L — ABNORMAL HIGH (ref 0–44)
AST: 51 IU/L — ABNORMAL HIGH (ref 0–40)
Albumin: 4.2 g/dL (ref 3.8–4.9)
Alkaline Phosphatase: 63 IU/L (ref 39–117)
Bilirubin Total: 0.3 mg/dL (ref 0.0–1.2)
Bilirubin, Direct: 0.1 mg/dL (ref 0.00–0.40)
Total Protein: 6.4 g/dL (ref 6.0–8.5)

## 2019-02-19 LAB — PSA: Prostate Specific Ag, Serum: 1.2 ng/mL (ref 0.0–4.0)

## 2019-02-21 ENCOUNTER — Encounter: Payer: Self-pay | Admitting: Family Medicine

## 2019-02-21 ENCOUNTER — Other Ambulatory Visit: Payer: Self-pay

## 2019-02-21 ENCOUNTER — Ambulatory Visit (INDEPENDENT_AMBULATORY_CARE_PROVIDER_SITE_OTHER): Payer: BC Managed Care – PPO | Admitting: Family Medicine

## 2019-02-21 VITALS — BP 132/84 | Temp 97.1°F | Ht 71.0 in | Wt 141.6 lb

## 2019-02-21 DIAGNOSIS — Z0000 Encounter for general adult medical examination without abnormal findings: Secondary | ICD-10-CM | POA: Diagnosis not present

## 2019-02-21 MED ORDER — SILDENAFIL CITRATE 20 MG PO TABS: ORAL_TABLET | ORAL | 5 refills | Status: DC

## 2019-02-21 NOTE — Progress Notes (Signed)
Subjective:    Patient ID: Daniel Burton, male    DOB: 07-19-1960, 58 y.o.   MRN: [id number]  HPI  The patient comes in today for a wellness visit.    A review of their health history was completed.  A review of medications was also completed.  Any needed refills;   Eating habits: pretty good  Falls/  MVA accidents in past few months: none  Regular exercise: not much- active at work  Specialist pt sees on regular basis: neurosurgeon for neck surgery  Preventative health issues were discussed.   Additional concerns: none  Results for orders placed or performed in visit on 01/10/19  Lipid panel  Result Value Ref Range   Cholesterol, Total 265 (H) 100 - 199 mg/dL   Triglycerides 81 0 - 149 mg/dL   HDL 72 >39 mg/dL   VLDL Cholesterol Cal 13 5 - 40 mg/dL   LDL Chol Calc (NIH) 180 (H) 0 - 99 mg/dL   Chol/HDL Ratio 3.7 0.0 - 5.0 ratio  Hepatic function panel  Result Value Ref Range   Total Protein 6.4 6.0 - 8.5 g/dL   Albumin 4.2 3.8 - 4.9 g/dL   Bilirubin Total 0.3 0.0 - 1.2 mg/dL   Bilirubin, Direct 0.10 0.00 - 0.40 mg/dL   Alkaline Phosphatase 63 39 - 117 IU/L   AST 51 (H) 0 - 40 IU/L   ALT 46 (H) 0 - 44 IU/L  Basic metabolic panel  Result Value Ref Range   Glucose 108 (H) 65 - 99 mg/dL   BUN 10 6 - 24 mg/dL   Creatinine, Ser 0.87 0.76 - 1.27 mg/dL   GFR calc non Af Amer 96 >59 mL/min/1.73   GFR calc Af Amer 111 >59 mL/min/1.73   BUN/Creatinine Ratio 11 9 - 20   Sodium 145 (H) 134 - 144 mmol/L   Potassium 4.2 3.5 - 5.2 mmol/L   Chloride 105 96 - 106 mmol/L   CO2 27 20 - 29 mmol/L   Calcium 9.0 8.7 - 10.2 mg/dL  PSA  Result Value Ref Range   Prostate Specific Ag, Serum 1.2 0.0 - 4.0 ng/mL     Review of Systems  Constitutional: Negative for activity change, appetite change and fever.  HENT: Negative for congestion and rhinorrhea.   Eyes: Negative for discharge.  Respiratory: Negative for cough and wheezing.   Cardiovascular: Negative for chest  pain.  Gastrointestinal: Negative for abdominal pain, blood in stool and vomiting.  Genitourinary: Negative for difficulty urinating and frequency.  Musculoskeletal: Negative for neck pain.  Skin: Negative for rash.  Allergic/Immunologic: Negative for environmental allergies and food allergies.  Neurological: Negative for weakness and headaches.  Psychiatric/Behavioral: Negative for agitation.  All other systems reviewed and are negative.      Objective:   Physical Exam Vitals signs reviewed.  Constitutional:      Appearance: He is well-developed.  HENT:     Head: Normocephalic and atraumatic.     Right Ear: External ear normal.     Left Ear: External ear normal.     Nose: Nose normal.  Eyes:     Pupils: Pupils are equal, round, and reactive to light.  Neck:     Musculoskeletal: Normal range of motion and neck supple.     Thyroid: No thyromegaly.  Cardiovascular:     Rate and Rhythm: Normal rate and regular rhythm.     Heart sounds: Normal heart sounds. No murmur.  Pulmonary:     Effort:  Pulmonary effort is normal. No respiratory distress.     Breath sounds: Normal breath sounds. No wheezing.  Abdominal:     General: Bowel sounds are normal. There is no distension.     Palpations: Abdomen is soft. There is no mass.     Tenderness: There is no abdominal tenderness.  Genitourinary:    Penis: Normal.   Musculoskeletal: Normal range of motion.  Lymphadenopathy:     Cervical: No cervical adenopathy.  Skin:    General: Skin is warm and dry.     Findings: No erythema.  Neurological:     Mental Status: He is alert.     Motor: No abnormal muscle tone.  Psychiatric:        Behavior: Behavior normal.        Judgment: Judgment normal.           Assessment & Plan:  Impression well adult exam.  Diet discussed.  Exercise discussed.  Blood work results discussed.  Mild liver enzyme elevation.  Patient states he has been drinking a fair amount of of wine recently and knows  he should not be doing that he will stop.  In addition LDL was quite high patient elected to take meds will work harder on diet.  Colonoscopy recommended patient to call back after cervical disc surgery.  Patient experiencing erectile dysfunction.  Would like a trial of generic Viagra.

## 2019-02-21 NOTE — Patient Instructions (Signed)
Results for orders placed or performed in visit on 01/10/19  Lipid panel  Result Value Ref Range   Cholesterol, Total 265 (H) 100 - 199 mg/dL   Triglycerides 81 0 - 149 mg/dL   HDL 72 >39 mg/dL   VLDL Cholesterol Cal 13 5 - 40 mg/dL   LDL Chol Calc (NIH) 180 (H) 0 - 99 mg/dL   Chol/HDL Ratio 3.7 0.0 - 5.0 ratio  Hepatic function panel  Result Value Ref Range   Total Protein 6.4 6.0 - 8.5 g/dL   Albumin 4.2 3.8 - 4.9 g/dL   Bilirubin Total 0.3 0.0 - 1.2 mg/dL   Bilirubin, Direct 0.10 0.00 - 0.40 mg/dL   Alkaline Phosphatase 63 39 - 117 IU/L   AST 51 (H) 0 - 40 IU/L   ALT 46 (H) 0 - 44 IU/L  Basic metabolic panel  Result Value Ref Range   Glucose 108 (H) 65 - 99 mg/dL   BUN 10 6 - 24 mg/dL   Creatinine, Ser 0.87 0.76 - 1.27 mg/dL   GFR calc non Af Amer 96 >59 mL/min/1.73   GFR calc Af Amer 111 >59 mL/min/1.73   BUN/Creatinine Ratio 11 9 - 20   Sodium 145 (H) 134 - 144 mmol/L   Potassium 4.2 3.5 - 5.2 mmol/L   Chloride 105 96 - 106 mmol/L   CO2 27 20 - 29 mmol/L   Calcium 9.0 8.7 - 10.2 mg/dL  PSA  Result Value Ref Range   Prostate Specific Ag, Serum 1.2 0.0 - 4.0 ng/mL

## 2019-02-24 ENCOUNTER — Encounter: Payer: Self-pay | Admitting: Family Medicine

## 2019-02-26 DIAGNOSIS — Z1159 Encounter for screening for other viral diseases: Secondary | ICD-10-CM | POA: Diagnosis not present

## 2019-02-28 LAB — SPECIMEN STATUS REPORT

## 2019-03-05 ENCOUNTER — Telehealth: Payer: Self-pay | Admitting: Family Medicine

## 2019-03-05 DIAGNOSIS — G992 Myelopathy in diseases classified elsewhere: Secondary | ICD-10-CM | POA: Diagnosis not present

## 2019-03-05 DIAGNOSIS — M5001 Cervical disc disorder with myelopathy,  high cervical region: Secondary | ICD-10-CM | POA: Diagnosis not present

## 2019-03-05 DIAGNOSIS — M4802 Spinal stenosis, cervical region: Secondary | ICD-10-CM | POA: Diagnosis not present

## 2019-03-05 LAB — TESTOSTERONE,FREE AND TOTAL
Testosterone, Free: 7.8 pg/mL (ref 7.2–24.0)
Testosterone: 525 ng/dL (ref 264–916)

## 2019-03-05 LAB — SPECIMEN STATUS REPORT

## 2019-03-05 NOTE — Telephone Encounter (Signed)
Patient had short-term disability form faxed over to be completed. Please review form complete highlighted area,date sign. In your yellow folder.

## 2019-03-28 HISTORY — PX: CERVICAL SPINE SURGERY: SHX589

## 2019-04-10 DIAGNOSIS — G992 Myelopathy in diseases classified elsewhere: Secondary | ICD-10-CM | POA: Diagnosis not present

## 2019-04-30 ENCOUNTER — Encounter: Payer: Self-pay | Admitting: Family Medicine

## 2019-05-13 ENCOUNTER — Encounter: Payer: Self-pay | Admitting: Family Medicine

## 2019-05-13 ENCOUNTER — Other Ambulatory Visit: Payer: Self-pay

## 2019-05-13 ENCOUNTER — Ambulatory Visit (INDEPENDENT_AMBULATORY_CARE_PROVIDER_SITE_OTHER): Payer: BC Managed Care – PPO | Admitting: Family Medicine

## 2019-05-13 VITALS — Temp 98.1°F | Ht 72.0 in | Wt 160.0 lb

## 2019-05-13 DIAGNOSIS — J329 Chronic sinusitis, unspecified: Secondary | ICD-10-CM | POA: Diagnosis not present

## 2019-05-13 DIAGNOSIS — J310 Chronic rhinitis: Secondary | ICD-10-CM | POA: Diagnosis not present

## 2019-05-13 MED ORDER — AMOXICILLIN-POT CLAVULANATE 875-125 MG PO TABS: 1.0000 | ORAL_TABLET | Freq: Two times a day (BID) | ORAL | 0 refills | Status: DC

## 2019-05-13 MED ORDER — FLUTICASONE PROPIONATE 50 MCG/ACT NA SUSP: 2.0000 | Freq: Two times a day (BID) | NASAL | 0 refills | Status: DC

## 2019-05-13 NOTE — Progress Notes (Signed)
   Subjective:  Audio plus video  Patient ID: Daniel Burton, male    DOB: 1961/02/03, 59 y.o.   MRN: [id number]  Sinusitis This is a new problem. Episode onset: months  Associated symptoms include congestion and headaches.   Virtual Visit via Video Note  I connected with Daniel Burton on 05/13/19 at 10:00 AM EST by a video enabled telemedicine application and verified that I am speaking with the correct person using two identifiers.  Location: Patient: home Provider: office   I discussed the limitations of evaluation and management by telemedicine and the availability of in person appointments. The patient expressed understanding and agreed to proceed.  History of Present Illness:    Observations/Objective:   Assessment and Plan:   Follow Up Instructions:    I discussed the assessment and treatment plan with the patient. The patient was provided an opportunity to ask questions and all were answered. The patient agreed with the plan and demonstrated an understanding of the instructions.   The patient was advised to call back or seek an in-person evaluation if the symptoms worsen or if the condition fails to improve as anticipated.  I provided 20 minutes of non-face-to-face time during this encounter.   Patient notes frontal headache.  Generally on the right side.  Sometimes steady.  Sometimes throbbing.  Accompanied by nasal congestion.  Progressive in recent months over a long period of time.  Nose completely stops up when he lays on 1 side or the other at night.  Reports no history of allergies  Reports all this has started up some cervical neck spine surgery, although he is not sure if related    Review of Systems  HENT: Positive for congestion.   Neurological: Positive for headaches.  No GI symptoms no fever no cough no shortness of breath no sore throat     Objective:   Physical Exam  Virtual      Assessment & Plan:  Impression subacute  rhinosinusitis with secondary headaches.  Most likely diagnosis.  Although with duration of headaches need to be sure this regimen takes care of things.  Augmentin twice daily for 10 days.  Flonase 2 sprays each nostril daily.  Follow-up in a couple weeks to assess response of headache

## 2019-05-14 ENCOUNTER — Ambulatory Visit: Payer: BC Managed Care – PPO | Admitting: Family Medicine

## 2019-05-16 ENCOUNTER — Ambulatory Visit
Admission: EM | Admit: 2019-05-16 | Discharge: 2019-05-16 | Disposition: A | Discharge status: Home / Self Care | Payer: BC Managed Care – PPO | Attending: Emergency Medicine | Admitting: Emergency Medicine

## 2019-05-16 ENCOUNTER — Other Ambulatory Visit: Payer: Self-pay

## 2019-05-16 DIAGNOSIS — Z0130 Encounter for examination of blood pressure without abnormal findings: Secondary | ICD-10-CM

## 2019-05-16 DIAGNOSIS — R9431 Abnormal electrocardiogram [ECG] [EKG]: Secondary | ICD-10-CM

## 2019-05-16 DIAGNOSIS — R739 Hyperglycemia, unspecified: Secondary | ICD-10-CM

## 2019-05-16 DIAGNOSIS — R110 Nausea: Secondary | ICD-10-CM

## 2019-05-16 DIAGNOSIS — R12 Heartburn: Secondary | ICD-10-CM

## 2019-05-16 LAB — POCT FASTING CBG KUC MANUAL ENTRY: POCT Glucose (KUC): 130 mg/dL — AB (ref 70–99)

## 2019-05-16 MED ORDER — ONDANSETRON 4 MG PO TBDP
4.0000 mg | ORAL_TABLET | Freq: Once | ORAL | Status: AC
  Administered 2019-05-16: 4 mg via ORAL

## 2019-05-16 MED ORDER — ONDANSETRON HCL 4 MG PO TABS: 4.0000 mg | ORAL_TABLET | Freq: Four times a day (QID) | ORAL | 0 refills | Status: DC

## 2019-05-16 NOTE — ED Provider Notes (Signed)
[place]   [id number] 05/16/19 Arrival Time: 37  CC: ABDOMINAL DISCOMFORT  SUBJECTIVE:  Daniel Burton is a 59 y.o. male who presents with complaint of nausea and epigastric discomfort x 2 week.  Reports eaating a lot of sweets (ice cream, doughnuts), greasy and fatty foods recently. Also reports abdominal discomfort. Localizes discomfort to epigastric region.  Describes as stable, intermittent and soreness in character.  Has not tried OTC medications.  Symptoms made worse with eating sweets, cakes, and soda.  Denies similar symptoms in the past.  Last BM this morning and normal for patient.  Complains of associated intermittent hot flashes, heartburn, and 10-15lbs over 3 months.    Denies fever, chills, appetite changes, weight changes, vomiting, chest pain, SOB, diarrhea, constipation, hematochezia, melena, dysuria, difficulty urinating, increased frequency or urgency, flank pain, loss of bowel or bladder function, polydipsia.    Wanted to have blood pressure and blood sugar checked.    ROS: As per HPI.  All other pertinent ROS negative.     History reviewed. No pertinent past medical history. Past Surgical History:  Procedure Laterality Date  . CERVICAL SPINE SURGERY    . ELBOW SURGERY Right   . HERNIA REPAIR     No Known Allergies No current facility-administered medications on file prior to encounter.   Current Outpatient Medications on File Prior to Encounter  Medication Sig Dispense Refill  . amoxicillin-clavulanate (AUGMENTIN) 875-125 MG tablet Take 1 tablet by mouth 2 (two) times daily. 28 tablet 0  . fluticasone (FLONASE) 50 MCG/ACT nasal spray Place 2 sprays into both nostrils in the morning and at bedtime. 16 g 0  . HYDROcodone-acetaminophen (NORCO/VICODIN) 5-325 MG tablet Take 1 tablet by mouth every 6 (six) hours as needed. 24 tablet 0   Social History   Socioeconomic History  . Marital status: Married    Spouse name: Not on file  . Number of  children: Not on file  . Years of education: Not on file  . Highest education level: Not on file  Occupational History  . Not on file  Tobacco Use  . Smoking status: Former Smoker    Types: Cigarettes  . Smokeless tobacco: Former Network engineer and Sexual Activity  . Alcohol use: No  . Drug use: Not on file  . Sexual activity: Not on file  Other Topics Concern  . Not on file  Social History Narrative  . Not on file   Social Determinants of Health   Financial Resource Strain:   . Difficulty of Paying Living Expenses: Not on file  Food Insecurity:   . Worried About Charity fundraiser in the Last Year: Not on file  . Ran Out of Food in the Last Year: Not on file  Transportation Needs:   . Lack of Transportation (Medical): Not on file  . Lack of Transportation (Non-Medical): Not on file  Physical Activity:   . Days of Exercise per Week: Not on file  . Minutes of Exercise per Session: Not on file  Stress:   . Feeling of Stress : Not on file  Social Connections:   . Frequency of Communication with Friends and Family: Not on file  . Frequency of Social Gatherings with Friends and Family: Not on file  . Attends Religious Services: Not on file  . Active Member of Clubs or Organizations: Not on file  . Attends Archivist Meetings: Not on file  . Marital Status: Not on file  Intimate Partner Violence:   .  Fear of Current or Ex-Partner: Not on file  . Emotionally Abused: Not on file  . Physically Abused: Not on file  . Sexually Abused: Not on file   Family History  Problem Relation Age of Onset  . Healthy Mother   . Healthy Father   . Allergic rhinitis Neg Hx   . Angioedema Neg Hx   . Asthma Neg Hx   . Urticaria Neg Hx      OBJECTIVE:  Vitals:   05/16/19 1319 05/16/19 1323 05/16/19 1327  BP:   111/75  Pulse:   76  Resp: 16 16 16   Temp:   98.2 F (36.8 C)  TempSrc: Oral Oral Oral  SpO2:   97%    General appearance: Alert; NAD HEENT: NCAT.  EOMI  grossly; Oropharynx clear.  Lungs: clear to auscultation bilaterally without adventitious breath sounds Heart: regular rate and rhythm.   Abdomen: soft, non-distended; normal active bowel sounds; non-tender to light and deep palpation; nontender at McBurney's point; negative Murphy's sign; no guarding Back: no CVA tenderness Extremities: no edema; symmetrical with no gross deformities Skin: warm and dry Neurologic: normal gait Psychological: alert and cooperative; normal mood and affect  LABS: Results for orders placed or performed during the hospital encounter of 05/16/19 (from the past 24 hour(s))  POCT CBG (manual entry)     Status: Abnormal   Collection Time: 05/16/19  1:37 PM  Result Value Ref Range   POCT Glucose ([facility]) 130 (A) 70 - 99 mg/dL    EKG:  EKG normal sinus rhythm without ST elevations, depressions, or prolonged PR interval.  No narrowing or widening of the QRS complexes.   Nonspecific t-wave abnormality.  No past EKG in chart.    ASSESSMENT & PLAN:  1. Nausea without vomiting   2. Heart burn   3. Blood pressure check   4. Elevated blood sugar   5. Abnormal finding on EKG     Meds ordered this encounter  Medications  . ondansetron (ZOFRAN) 4 MG tablet    Sig: Take 1 tablet (4 mg total) by mouth every 6 (six) hours.    Dispense:  12 tablet    Refill:  0    Order Specific Question:   Supervising Provider    Answer:   Raylene Everts [id number]  . ondansetron (ZOFRAN-ODT) disintegrating tablet 4 mg   Get rest and drink fluids Declines acid reflux medication today.   Zofran prescribed.  Take as needed for nausea.   Avoid eating 2-3 hours before bed Elevate head of bed.  Avoid chocolate, caffeine, alcohol, onion, and mint prior to bed.  This relaxes the bottom part of your esophagus and can make your symptoms worse.  Follow up with PCP for further evaluation and management  Return or go to the ED if you have any new or worsening symptoms such as fever,  chills, worsening nausea, vomiting, diarrhea, bloody or dark tarry stools, constipation, urinary symptoms, worsening abdominal discomfort, symptoms that do not improve with medications, inability to keep fluids down, chest pain, shortness of breath, weakness, worsening abdominal pain, etc...  Blood sugar elevated in office In the meantime limit sugar.  Stick with lean meats, vegetables, and fruits Avoid sodas, as they have a lot of sugar, stick with water Follow up with PCP for further evaluation and management  Discussed nonspecific t-wave abnormalities with patient.  I have low suspicion for cardiac cause, and patient currently without chest pain or SOB.  Offered patient further evaluation and management  in the ED if he was concern for cardiac cause.  Patient declines at this time.  Instructed patient to follow up with PCP.  Gave patient strict ED precautions if he experienced chest pain.    Reviewed expectations re: course of current medical issues. Questions answered. Outlined signs and symptoms indicating need for more acute intervention. Patient verbalized understanding. After Visit Summary given.   Lestine Box, PA-C 05/16/19 1646

## 2019-05-16 NOTE — Discharge Instructions (Signed)
Get rest and drink fluids Declines acid reflux medication today.   Zofran prescribed.  Take as needed for nausea.   Avoid eating 2-3 hours before bed Elevate head of bed.  Avoid chocolate, caffeine, alcohol, onion, and mint prior to bed.  This relaxes the bottom part of your esophagus and can make your symptoms worse.  Follow up with PCP for further evaluation and management  Return or go to the ED if you have any new or worsening symptoms such as fever, chills, worsening nausea, vomiting, diarrhea, bloody or dark tarry stools, constipation, urinary symptoms, worsening abdominal discomfort, symptoms that do not improve with medications, inability to keep fluids down, chest pain, shortness of breath, weakness, worsening abdominal pain, etc...  Blood sugar elevated to 130  In office In the meantime limit sugar.  Stick with lean meats, vegetables, and fruits Avoid sodas, as they have a lot of sugar, stick with water Follow up with PCP for further evaluation and management

## 2019-05-16 NOTE — ED Triage Notes (Signed)
Pt presents to UC w/ c/o nausea, heartburn x1 week.  Neg covid test 10 days ago. Pt c/o hot flashes x2-3 wees ago. Pt states he has hot flashes and headaches, then the nausea and heartburn starts after.

## 2019-05-22 DIAGNOSIS — G992 Myelopathy in diseases classified elsewhere: Secondary | ICD-10-CM | POA: Diagnosis not present

## 2019-05-27 ENCOUNTER — Ambulatory Visit: Payer: BC Managed Care – PPO | Admitting: Family Medicine

## 2019-05-28 ENCOUNTER — Ambulatory Visit (INDEPENDENT_AMBULATORY_CARE_PROVIDER_SITE_OTHER): Payer: BC Managed Care – PPO | Admitting: Family Medicine

## 2019-05-28 ENCOUNTER — Other Ambulatory Visit: Payer: Self-pay

## 2019-05-28 ENCOUNTER — Encounter: Payer: Self-pay | Admitting: Family Medicine

## 2019-05-28 VITALS — BP 118/76 | Temp 97.4°F | Ht 72.0 in | Wt 162.6 lb

## 2019-05-28 DIAGNOSIS — F439 Reaction to severe stress, unspecified: Secondary | ICD-10-CM | POA: Diagnosis not present

## 2019-05-28 DIAGNOSIS — R739 Hyperglycemia, unspecified: Secondary | ICD-10-CM

## 2019-05-28 DIAGNOSIS — M542 Cervicalgia: Secondary | ICD-10-CM | POA: Diagnosis not present

## 2019-05-28 DIAGNOSIS — R079 Chest pain, unspecified: Secondary | ICD-10-CM

## 2019-05-28 LAB — POCT GLUCOSE (DEVICE FOR HOME USE): Glucose Fasting, POC: 99 mg/dL (ref 70–99)

## 2019-05-28 NOTE — Progress Notes (Signed)
   Subjective:    Patient ID: Daniel Burton, male    DOB: 1961-02-12, 59 y.o.   MRN: [id number]  HPIFollow up on headaches. Has a throbbing pain on right side every morning since having surgery.  States overall has improved in recent weeks.  Pt wanted blood sugar checked. Pt states fasting sugar at urgent care was 130 and he bought a glucose monitor and it was 140 fasting at home. Checked today in office and he is fasting and it was 99.  Was worried about his sugar.  Pt wants dr Richardson Landry to review EKG done at urgent care.  Was worried about his EKG.  Notes some tenderness and soreness at the bottom of the sternum.  Points towards the xiphoid process location.  Worse with certain motions.  Also concerned about going back to full-time work since being out of work since December with cervical spine surgery  Results for orders placed or performed in visit on 05/28/19  POCT Glucose (Device for Home Use)  Result Value Ref Range   Glucose Fasting, POC 99 70 - 99 mg/dL   POC Glucose        Review of Systems No exertional chest pain no nausea no diaphoresis no shortness of breath    Objective:   Physical Exam  Alert and oriented, vitals reviewed and stable, NAD ENT-TM's and ext canals WNL bilat via otoscopic exam Soft palate, tonsils and post pharynx WNL via oropharyngeal exam Neck-symmetric, no masses; thyroid nonpalpable and nontender Pulmonary-no tachypnea or accessory muscle use; Clear without wheezes via auscultation Card--no abnrml murmurs, rhythm reg and rate WNL Carotid pulses symmetric, without bruits Anterior xiphoid process tender to deep palpation  EKG from urgent care reviewed.  Normal sinus rhythm very nonspecific ST-T changes    Assessment & Plan:  Impression Glucose concerns discussed within normal limits  2.  EKG concerns discussed within normal limits  #3  Anterior chest pain musculoskeletal discussed  4.  Deconditioning with facing full-time work  discussed  Greater than 50% of this30 minute face to face visit was spent in counseling and discussion and coordination of care regarding the above diagnosis/diagnosies   Exercise encouraged symptom care discussed

## 2019-06-05 ENCOUNTER — Other Ambulatory Visit: Payer: Self-pay | Admitting: Family Medicine

## 2019-06-11 DIAGNOSIS — M546 Pain in thoracic spine: Secondary | ICD-10-CM | POA: Diagnosis not present

## 2019-06-11 DIAGNOSIS — M545 Low back pain: Secondary | ICD-10-CM | POA: Diagnosis not present

## 2019-06-11 DIAGNOSIS — M9903 Segmental and somatic dysfunction of lumbar region: Secondary | ICD-10-CM | POA: Diagnosis not present

## 2019-06-11 DIAGNOSIS — M9902 Segmental and somatic dysfunction of thoracic region: Secondary | ICD-10-CM | POA: Diagnosis not present

## 2019-06-13 DIAGNOSIS — M545 Low back pain: Secondary | ICD-10-CM | POA: Diagnosis not present

## 2019-06-13 DIAGNOSIS — M9902 Segmental and somatic dysfunction of thoracic region: Secondary | ICD-10-CM | POA: Diagnosis not present

## 2019-06-13 DIAGNOSIS — M546 Pain in thoracic spine: Secondary | ICD-10-CM | POA: Diagnosis not present

## 2019-06-13 DIAGNOSIS — M9903 Segmental and somatic dysfunction of lumbar region: Secondary | ICD-10-CM | POA: Diagnosis not present

## 2019-07-22 DIAGNOSIS — M4802 Spinal stenosis, cervical region: Secondary | ICD-10-CM | POA: Diagnosis not present

## 2019-07-30 ENCOUNTER — Other Ambulatory Visit: Payer: Self-pay | Admitting: Student

## 2019-07-30 DIAGNOSIS — G8929 Other chronic pain: Secondary | ICD-10-CM

## 2019-07-31 ENCOUNTER — Ambulatory Visit
Admission: EM | Admit: 2019-07-31 | Discharge: 2019-07-31 | Disposition: A | Discharge status: Home / Self Care | Payer: BC Managed Care – PPO | Attending: Emergency Medicine | Admitting: Emergency Medicine

## 2019-07-31 DIAGNOSIS — M79602 Pain in left arm: Secondary | ICD-10-CM | POA: Diagnosis not present

## 2019-07-31 DIAGNOSIS — R200 Anesthesia of skin: Secondary | ICD-10-CM

## 2019-07-31 HISTORY — DX: Other specified postprocedural states: Z98.890

## 2019-07-31 HISTORY — DX: Dorsalgia, unspecified: M54.9

## 2019-07-31 MED ORDER — PREDNISONE 20 MG PO TABS: 20.0000 mg | ORAL_TABLET | Freq: Two times a day (BID) | ORAL | 0 refills | Status: AC

## 2019-07-31 NOTE — ED Triage Notes (Signed)
Pt has c/o left arm pain and tingling. Pt had cervical spine surgery in December

## 2019-07-31 NOTE — ED Provider Notes (Signed)
[place]   [id number] 07/31/19 Arrival Time: [id number]  CC: LT arm discomfort  SUBJECTIVE: History from: patient. Daniel Burton is a 59 y.o. male complains of LT arm discomfort and tingling/ numbness x 1-2 days.  Denies a precipitating event or specific injury.  Reports hx of cervical spine surgery in the past, and has some nerve damage in LT hand.  Localizes the symptoms to LT arm.  Describes the pain as intermittent and tingling in character.  Has NOT tried OTC medications.  Symptoms are made worse with neck movement.  Denies similar symptoms in the past.  Denies fever, chills, erythema, ecchymosis, effusion, weakness, chest pain, SOB.    ROS: As per HPI.  All other pertinent ROS negative.     Past Medical History:  Diagnosis Date  . Back pain with history of spinal surgery    Past Surgical History:  Procedure Laterality Date  . CERVICAL SPINE SURGERY    . ELBOW SURGERY Right   . HERNIA REPAIR     No Known Allergies No current facility-administered medications on file prior to encounter.   Current Outpatient Medications on File Prior to Encounter  Medication Sig Dispense Refill  . fluticasone (FLONASE) 50 MCG/ACT nasal spray PLACE 2 SPRAYS INTO BOTH NOSTRILS IN THE MORNING AND AT BEDTIME. 16 mL 5   Social History   Socioeconomic History  . Marital status: Married    Spouse name: Not on file  . Number of children: Not on file  . Years of education: Not on file  . Highest education level: Not on file  Occupational History  . Not on file  Tobacco Use  . Smoking status: Former Smoker    Types: Cigarettes  . Smokeless tobacco: Former Network engineer and Sexual Activity  . Alcohol use: No  . Drug use: Not on file  . Sexual activity: Not on file  Other Topics Concern  . Not on file  Social History Narrative  . Not on file   Social Determinants of Health   Financial Resource Strain:   . Difficulty of Paying Living Expenses:   Food Insecurity:   . Worried  About Charity fundraiser in the Last Year:   . Arboriculturist in the Last Year:   Transportation Needs:   . Film/video editor (Medical):   Marland Kitchen Lack of Transportation (Non-Medical):   Physical Activity:   . Days of Exercise per Week:   . Minutes of Exercise per Session:   Stress:   . Feeling of Stress :   Social Connections:   . Frequency of Communication with Friends and Family:   . Frequency of Social Gatherings with Friends and Family:   . Attends Religious Services:   . Active Member of Clubs or Organizations:   . Attends Archivist Meetings:   Marland Kitchen Marital Status:   Intimate Partner Violence:   . Fear of Current or Ex-Partner:   . Emotionally Abused:   Marland Kitchen Physically Abused:   . Sexually Abused:    Family History  Problem Relation Age of Onset  . Healthy Mother   . Healthy Father   . Allergic rhinitis Neg Hx   . Angioedema Neg Hx   . Asthma Neg Hx   . Urticaria Neg Hx     OBJECTIVE:  Vitals:   07/31/19 1136  BP: 115/72  Pulse: 69  Resp: 18  Temp: 98.3 F (36.8 C)  SpO2: 98%    General appearance: ALERT; in  no acute distress.  Head: NCAT Lungs: Normal respiratory effort; CTAB CV: RRR Musculoskeletal: LT arm Inspection: Skin warm, dry, clear and intact without obvious erythema, effusion, or ecchymosis.  Palpation: Nontender to palpation ROM: FROM active and passive Strength: 5/5 shld abduction, 5/5 shld adduction, 5/5 elbow flexion, 5/5 elbow extension, 5/5 grip strength Skin: warm and dry Neurologic: Ambulates without difficulty Psychological: alert and cooperative; normal mood and affect  EKG:  EKG normal sinus rhythm without ST elevations, depressions, or prolonged PR interval.  No narrowing or widening of the QRS complexes.  Comparable to past EKG.     ASSESSMENT & PLAN:  1. Left arm pain   2. Left arm numbness     Meds ordered this encounter  Medications  . predniSONE (DELTASONE) 20 MG tablet    Sig: Take 1 tablet (20 mg total) by  mouth 2 (two) times daily with a meal for 5 days.    Dispense:  10 tablet    Refill:  0    Order Specific Question:   Supervising Provider    Answer:   Raylene Everts [id number]   Unable to rule out cardiac disease or blood clot in urgent care setting.  Offered patient further evaluation and management in the ED.  Patient declines at this time and would like to try outpatient therapy first.  Aware of the risk associated with this decision including missed diagnosis, organ damage, organ failure, and/or death.  Patient aware and in agreement.     EKG looks good Declines steroid shot in office Continue conservative management of rest, ice, and gentle stretches Prednisone prescribed.  Take as directed and to completion Follow up with PCP for recheck Return or go to the ER if you have any new or worsening symptoms (fever, chills, chest pain, shortness of breath, weakness, slurred speech, facial droop, etc...)   Reviewed expectations re: course of current medical issues. Questions answered. Outlined signs and symptoms indicating need for more acute intervention. Patient verbalized understanding. After Visit Summary given.    Lestine Box, PA-C 07/31/19 1247

## 2019-07-31 NOTE — Discharge Instructions (Addendum)
Unable to rule out cardiac disease or blood clot in urgent care setting.  Offered patient further evaluation and management in the ED.  Patient declines at this time and would like to try outpatient therapy first.  Aware of the risk associated with this decision including missed diagnosis, organ damage, organ failure, and/or death.  Patient aware and in agreement.     EKG looks good Declines steroid shot in office Continue conservative management of rest, ice, and gentle stretches Prednisone prescribed.  Take as directed and to completion Follow up with PCP for recheck Return or go to the ER if you have any new or worsening symptoms (fever, chills, chest pain, shortness of breath, weakness, slurred speech, facial droop, etc...)

## 2019-08-03 ENCOUNTER — Emergency Department (HOSPITAL_COMMUNITY)
Admission: EM | Admit: 2019-08-03 | Discharge: 2019-08-04 | Disposition: A | Discharge status: Home / Self Care | Payer: BC Managed Care – PPO | Attending: Emergency Medicine | Admitting: Emergency Medicine

## 2019-08-03 ENCOUNTER — Other Ambulatory Visit: Payer: Self-pay

## 2019-08-03 ENCOUNTER — Encounter (HOSPITAL_COMMUNITY): Payer: Self-pay | Admitting: *Deleted

## 2019-08-03 DIAGNOSIS — Z79899 Other long term (current) drug therapy: Secondary | ICD-10-CM | POA: Insufficient documentation

## 2019-08-03 DIAGNOSIS — Z87891 Personal history of nicotine dependence: Secondary | ICD-10-CM | POA: Insufficient documentation

## 2019-08-03 DIAGNOSIS — R42 Dizziness and giddiness: Secondary | ICD-10-CM | POA: Insufficient documentation

## 2019-08-03 MED ORDER — MECLIZINE HCL 12.5 MG PO TABS
25.0000 mg | ORAL_TABLET | Freq: Once | ORAL | Status: AC
  Administered 2019-08-04: 25 mg via ORAL
  Filled 2019-08-03: qty 2

## 2019-08-03 MED ORDER — ONDANSETRON HCL 4 MG/2ML IJ SOLN
4.0000 mg | Freq: Once | INTRAMUSCULAR | Status: AC
  Administered 2019-08-04: 4 mg via INTRAVENOUS
  Filled 2019-08-03: qty 2

## 2019-08-03 NOTE — ED Provider Notes (Signed)
[facility] EMERGENCY DEPARTMENT Provider Note   CSN: [id number] Arrival date & time: 08/03/19  2325   Time seen 11:48 PM  History Chief Complaint  Patient presents with  . Dizziness    Daniel Burton is a 59 y.o. male.  HPI   Patient had worked all day today from 7 AM to 7 PM and came home and ate dinner and then lay down to sleep.  He woke up about an hour ago and was feeling dizzy.  He states he felt like things were moving and spinning.  He states it was worse if he closed his eyes.  He denies nausea but his wife states he told her he was having nausea.  He denies vomiting.  He states he felt off balance for a short while however his wife did not have to help him walk.  He denies any new numbness or tingling in his extremities.  He denies headache or blurred vision.  He states he did get sweaty but denies chills.  He states he has had some mild burning in his chest off and on that he relates to heartburn and states he has it a lot.  He does not take heartburn medications.  He denies cough, sneezing, sore throat.  He states he is never had this before.  PCP Mikey Kirschner, MD   Past Medical History:  Diagnosis Date  . Back pain with history of spinal surgery     Patient Active Problem List   Diagnosis Date Noted  . Chronic back pain 10/29/2012    Past Surgical History:  Procedure Laterality Date  . CERVICAL SPINE SURGERY    . ELBOW SURGERY Right   . HERNIA REPAIR         Family History  Problem Relation Age of Onset  . Healthy Mother   . Healthy Father   . Allergic rhinitis Neg Hx   . Angioedema Neg Hx   . Asthma Neg Hx   . Urticaria Neg Hx     Social History   Tobacco Use  . Smoking status: Former Smoker    Types: Cigarettes  . Smokeless tobacco: Former Network engineer Use Topics  . Alcohol use: No  . Drug use: Not on file  employed Lives with spouse  Home Medications Prior to Admission medications   Medication Sig Start Date End Date Taking?  Authorizing Provider  fluticasone (FLONASE) 50 MCG/ACT nasal spray PLACE 2 SPRAYS INTO BOTH NOSTRILS IN THE MORNING AND AT BEDTIME. 06/05/19   Mikey Kirschner, MD  meclizine (ANTIVERT) 25 MG tablet Take 1 tablet (25 mg total) by mouth 4 (four) times daily as needed for dizziness. 08/04/19   Rolland Porter, MD  ondansetron (ZOFRAN) 4 MG tablet Take 1 tablet (4 mg total) by mouth every 8 (eight) hours as needed for nausea or vomiting. 08/04/19   Rolland Porter, MD  predniSONE (DELTASONE) 20 MG tablet Take 1 tablet (20 mg total) by mouth 2 (two) times daily with a meal for 5 days. 07/31/19 08/05/19  Lestine Box, PA-C    Allergies    Patient has no known allergies.  Review of Systems   Review of Systems  All other systems reviewed and are negative.   Physical Exam Updated Vital Signs BP 118/76   Pulse 66   Temp 99.6 F (37.6 C) (Oral)   Resp 14   Ht 6' (1.829 m)   Wt 73 kg   SpO2 98%   BMI 21.84 kg/m  Physical Exam Vitals and nursing note reviewed.  Constitutional:      Appearance: Normal appearance. He is normal weight.  HENT:     Head: Normocephalic and atraumatic.     Nose: Nose normal.     Mouth/Throat:     Mouth: Mucous membranes are dry.  Eyes:     Extraocular Movements: Extraocular movements intact.     Conjunctiva/sclera: Conjunctivae normal.     Pupils: Pupils are equal, round, and reactive to light.  Cardiovascular:     Rate and Rhythm: Normal rate and regular rhythm.     Pulses: Normal pulses.  Pulmonary:     Effort: Pulmonary effort is normal. No respiratory distress.     Breath sounds: Normal breath sounds.  Abdominal:     General: Abdomen is flat. Bowel sounds are normal.     Palpations: Abdomen is soft.  Musculoskeletal:        General: Normal range of motion.     Cervical back: Normal range of motion and neck supple.  Skin:    General: Skin is warm and dry.  Neurological:     General: No focal deficit present.     Mental Status: He is alert and oriented  to person, place, and time.     Cranial Nerves: No cranial nerve deficit.     Comments: When patient set up so I can listen for his lungs he states it made him feel briefly dizzy.  Psychiatric:        Mood and Affect: Mood normal.        Behavior: Behavior normal.        Thought Content: Thought content normal.     ED Results / Procedures / Treatments   Labs (all labs ordered are listed, but only abnormal results are displayed) Results for orders placed or performed during the hospital encounter of 08/03/19  Comprehensive metabolic panel  Result Value Ref Range   Sodium 141 135 - 145 mmol/L   Potassium 3.6 3.5 - 5.1 mmol/L   Chloride 103 98 - 111 mmol/L   CO2 28 22 - 32 mmol/L   Glucose, Bld 99 70 - 99 mg/dL   BUN 17 6 - 20 mg/dL   Creatinine, Ser 0.92 0.61 - 1.24 mg/dL   Calcium 9.3 8.9 - 10.3 mg/dL   Total Protein 7.9 6.5 - 8.1 g/dL   Albumin 4.4 3.5 - 5.0 g/dL   AST 29 15 - 41 U/L   ALT 26 0 - 44 U/L   Alkaline Phosphatase 93 38 - 126 U/L   Total Bilirubin 0.5 0.3 - 1.2 mg/dL   GFR calc non Af Amer >60 >60 mL/min   GFR calc Af Amer >60 >60 mL/min   Anion gap 10 5 - 15  CBC with Differential  Result Value Ref Range   WBC 6.2 4.0 - 10.5 K/uL   RBC 4.99 4.22 - 5.81 MIL/uL   Hemoglobin 13.4 13.0 - 17.0 g/dL   HCT 41.8 39.0 - 52.0 %   MCV 83.8 80.0 - 100.0 fL   MCH 26.9 26.0 - 34.0 pg   MCHC 32.1 30.0 - 36.0 g/dL   RDW 14.6 11.5 - 15.5 %   Platelets 266 150 - 400 K/uL   nRBC 0.0 0.0 - 0.2 %   Neutrophils Relative % 42 %   Neutro Abs 2.6 1.7 - 7.7 K/uL   Lymphocytes Relative 45 %   Lymphs Abs 2.8 0.7 - 4.0 K/uL   Monocytes Relative 8 %  Monocytes Absolute 0.5 0.1 - 1.0 K/uL   Eosinophils Relative 4 %   Eosinophils Absolute 0.3 0.0 - 0.5 K/uL   Basophils Relative 1 %   Basophils Absolute 0.0 0.0 - 0.1 K/uL   Immature Granulocytes 0 %   Abs Immature Granulocytes 0.01 0.00 - 0.07 K/uL  Lipase, blood  Result Value Ref Range   Lipase 32 11 - 51 U/L  Troponin I  (High Sensitivity)  Result Value Ref Range   Troponin I (High Sensitivity) 3 <18 ng/L   Laboratory interpretation all normal   EKG EKG Interpretation  Date/Time:  Sunday Aug 03 2019 23:38:00 EDT Ventricular Rate:  67 PR Interval:    QRS Duration: 99 QT Interval:  402 QTC Calculation: 425 R Axis:   80 Text Interpretation: Sinus rhythm Baseline wander in lead(s) V6 No old tracing to compare Confirmed by Quention Mcneill ([id number]) on 08/03/2019 11:46:12 PM   Radiology No results found.  Procedures Procedures (including critical care time)  Medications Ordered in ED Medications  ondansetron (ZOFRAN) injection 4 mg (4 mg Intravenous Given 08/04/19 0009)  meclizine (ANTIVERT) tablet 25 mg (25 mg Oral Given 08/04/19 0009)    ED Course  I have reviewed the triage vital signs and the nursing notes.  Pertinent labs & imaging results that were available during my care of the patient were reviewed by me and considered in my medical decision making (see chart for details).    MDM Rules/Calculators/A&P                      Patient symptoms are suggestive of vertigo.  He was given IV Zofran although is not nauseated at this time it will help with the vertigo, and Antivert.  Laboratory testing was done to rule out other etiologies of his symptoms.  Recheck at 12:30 AM patient states he is feeling better.  We discussed his test results which were all normal.  He sat up and states he felt better than he did before when he sat up.  He feels ready to be discharged.   Final Clinical Impression(s) / ED Diagnoses Final diagnoses:  Dizziness  Vertigo    Rx / DC Orders ED Discharge Orders         Ordered    meclizine (ANTIVERT) 25 MG tablet  4 times daily PRN     08/04/19 0043    ondansetron (ZOFRAN) 4 MG tablet  Every 8 hours PRN     05 /10/21 0043         Plan discharge  Rolland Porter, MD, Barbette Or, MD 08/04/19 [phone]

## 2019-08-03 NOTE — ED Triage Notes (Signed)
Pt c/o intermittent dizziness episodes that started tonight along with heartburn,

## 2019-08-04 LAB — CBC WITH DIFFERENTIAL/PLATELET
Abs Immature Granulocytes: 0.01 10*3/uL (ref 0.00–0.07)
Basophils Absolute: 0 10*3/uL (ref 0.0–0.1)
Basophils Relative: 1 %
Eosinophils Absolute: 0.3 10*3/uL (ref 0.0–0.5)
Eosinophils Relative: 4 %
HCT: 41.8 % (ref 39.0–52.0)
Hemoglobin: 13.4 g/dL (ref 13.0–17.0)
Immature Granulocytes: 0 %
Lymphocytes Relative: 45 %
Lymphs Abs: 2.8 10*3/uL (ref 0.7–4.0)
MCH: 26.9 pg (ref 26.0–34.0)
MCHC: 32.1 g/dL (ref 30.0–36.0)
MCV: 83.8 fL (ref 80.0–100.0)
Monocytes Absolute: 0.5 10*3/uL (ref 0.1–1.0)
Monocytes Relative: 8 %
Neutro Abs: 2.6 10*3/uL (ref 1.7–7.7)
Neutrophils Relative %: 42 %
Platelets: 266 10*3/uL (ref 150–400)
RBC: 4.99 MIL/uL (ref 4.22–5.81)
RDW: 14.6 % (ref 11.5–15.5)
WBC: 6.2 10*3/uL (ref 4.0–10.5)
nRBC: 0 % (ref 0.0–0.2)

## 2019-08-04 LAB — COMPREHENSIVE METABOLIC PANEL
ALT: 26 U/L (ref 0–44)
AST: 29 U/L (ref 15–41)
Albumin: 4.4 g/dL (ref 3.5–5.0)
Alkaline Phosphatase: 93 U/L (ref 38–126)
Anion gap: 10 (ref 5–15)
BUN: 17 mg/dL (ref 6–20)
CO2: 28 mmol/L (ref 22–32)
Calcium: 9.3 mg/dL (ref 8.9–10.3)
Chloride: 103 mmol/L (ref 98–111)
Creatinine, Ser: 0.92 mg/dL (ref 0.61–1.24)
GFR calc Af Amer: >60 mL/min (ref >60)
GFR calc non Af Amer: >60 mL/min (ref >60)
Glucose, Bld: 99 mg/dL (ref 70–99)
Potassium: 3.6 mmol/L (ref 3.5–5.1)
Sodium: 141 mmol/L (ref 135–145)
Total Bilirubin: 0.5 mg/dL (ref 0.3–1.2)
Total Protein: 7.9 g/dL (ref 6.5–8.1)

## 2019-08-04 LAB — TROPONIN I (HIGH SENSITIVITY): Troponin I (High Sensitivity): 3 ng/L (ref <18)

## 2019-08-04 LAB — LIPASE, BLOOD: Lipase: 32 U/L (ref 11–51)

## 2019-08-04 MED ORDER — ONDANSETRON HCL 4 MG PO TABS: 4.0000 mg | ORAL_TABLET | Freq: Three times a day (TID) | ORAL | 0 refills | Status: DC | PRN

## 2019-08-04 MED ORDER — MECLIZINE HCL 25 MG PO TABS: 25.0000 mg | ORAL_TABLET | Freq: Four times a day (QID) | ORAL | 0 refills | Status: DC | PRN

## 2019-08-04 NOTE — Discharge Instructions (Addendum)
Drink plenty of fluids.  Take the medication as prescribed.  Please do not drive a car until you are sure the dizziness is gone, no climbing ladders, and be very careful going up and down stairs, please use the handrails in case she gets dizzy.  If you continue to have dizziness please be evaluated by the ENT on-call,Dr Benjamine Mola, to see if he would benefit from therapy.

## 2019-08-08 ENCOUNTER — Encounter: Payer: Self-pay | Admitting: Family Medicine

## 2019-08-08 ENCOUNTER — Ambulatory Visit (INDEPENDENT_AMBULATORY_CARE_PROVIDER_SITE_OTHER): Payer: BC Managed Care – PPO | Admitting: Family Medicine

## 2019-08-08 ENCOUNTER — Other Ambulatory Visit: Payer: Self-pay

## 2019-08-08 VITALS — BP 134/80 | HR 93 | Temp 97.6°F | Wt 166.0 lb

## 2019-08-08 DIAGNOSIS — R42 Dizziness and giddiness: Secondary | ICD-10-CM

## 2019-08-08 NOTE — Progress Notes (Signed)
   Subjective:    Patient ID: Daniel Burton, male    DOB: [birth year], 59 y.o.   MRN: [id number]  HPIfollow up ED visit on vertigo. Pt states dizziness is better. Took meclizine this morning.   Emergency room report reviewed in presence of patient  Distinct vertigo-like dizziness that occurred with change of position change in head position  No headache  No current chest pain or shortness of breath  Review of Systems See above    Objective:   Physical Exam  Alert vitals stable, NAD. Blood pressure good on repeat. HEENT normal. Lungs clear. Heart regular rate and rhythm. Finger-to-nose within normal limits.  No abnormal cerebellar findings      Assessment & Plan:  Impression vertigo.  Discussed.  Etiology discussed.  Treatment discussed.

## 2019-10-09 ENCOUNTER — Ambulatory Visit: Payer: BC Managed Care – PPO | Attending: Internal Medicine

## 2019-10-09 DIAGNOSIS — Z23 Encounter for immunization: Secondary | ICD-10-CM

## 2019-10-09 NOTE — Progress Notes (Signed)
   Covid-19 Vaccination Clinic  Name:  Daniel Burton    MRN: [id number] DOB: Feb 10, 1961  10/09/2019  [name] was observed post Covid-19 immunization for 15 minutes without incident. He was provided with Vaccine Information Sheet and instruction to access the V-Safe system.   [name] was instructed to call 911 with any severe reactions post vaccine: Marland Kitchen Difficulty breathing  . Swelling of face and throat  . A fast heartbeat  . A bad rash all over body  . Dizziness and weakness   Immunizations Administered    Name Date Dose VIS Date Route   Pfizer COVID-19 Vaccine 10/09/2019  9:14 AM 0.3 mL 05/21/2018 Intramuscular   Manufacturer: [place]   Lot: [id number]   [place]: [address]-2

## 2019-10-22 DIAGNOSIS — M545 Low back pain: Secondary | ICD-10-CM | POA: Diagnosis not present

## 2019-10-22 DIAGNOSIS — M9903 Segmental and somatic dysfunction of lumbar region: Secondary | ICD-10-CM | POA: Diagnosis not present

## 2019-10-22 DIAGNOSIS — M546 Pain in thoracic spine: Secondary | ICD-10-CM | POA: Diagnosis not present

## 2019-10-22 DIAGNOSIS — M9902 Segmental and somatic dysfunction of thoracic region: Secondary | ICD-10-CM | POA: Diagnosis not present

## 2019-10-27 DIAGNOSIS — M545 Low back pain: Secondary | ICD-10-CM | POA: Diagnosis not present

## 2019-10-27 DIAGNOSIS — M9902 Segmental and somatic dysfunction of thoracic region: Secondary | ICD-10-CM | POA: Diagnosis not present

## 2019-10-27 DIAGNOSIS — M9903 Segmental and somatic dysfunction of lumbar region: Secondary | ICD-10-CM | POA: Diagnosis not present

## 2019-10-27 DIAGNOSIS — M546 Pain in thoracic spine: Secondary | ICD-10-CM | POA: Diagnosis not present

## 2019-10-31 DIAGNOSIS — M9902 Segmental and somatic dysfunction of thoracic region: Secondary | ICD-10-CM | POA: Diagnosis not present

## 2019-10-31 DIAGNOSIS — M546 Pain in thoracic spine: Secondary | ICD-10-CM | POA: Diagnosis not present

## 2019-10-31 DIAGNOSIS — M9903 Segmental and somatic dysfunction of lumbar region: Secondary | ICD-10-CM | POA: Diagnosis not present

## 2019-10-31 DIAGNOSIS — M545 Low back pain: Secondary | ICD-10-CM | POA: Diagnosis not present

## 2019-11-04 ENCOUNTER — Ambulatory Visit: Payer: BC Managed Care – PPO | Attending: Internal Medicine

## 2019-11-04 DIAGNOSIS — Z23 Encounter for immunization: Secondary | ICD-10-CM

## 2019-11-04 NOTE — Progress Notes (Signed)
   Covid-19 Vaccination Clinic  Name:  Daniel Burton    MRN: [id number] DOB: 1960/08/07  11/04/2019  [name] was observed post Covid-19 immunization for 15 minutes without incident. He was provided with Vaccine Information Sheet and instruction to access the V-Safe system.   [name] was instructed to call 911 with any severe reactions post vaccine: Marland Kitchen Difficulty breathing  . Swelling of face and throat  . A fast heartbeat  . A bad rash all over body  . Dizziness and weakness   Immunizations Administered    Name Date Dose VIS Date Route   Pfizer COVID-19 Vaccine 11/04/2019  8:44 AM 0.3 mL 05/21/2018 Intramuscular   Manufacturer: [place]   Lot: [id number]   [place]: [id number]

## 2019-11-05 DIAGNOSIS — M546 Pain in thoracic spine: Secondary | ICD-10-CM | POA: Diagnosis not present

## 2019-11-05 DIAGNOSIS — M545 Low back pain: Secondary | ICD-10-CM | POA: Diagnosis not present

## 2019-11-05 DIAGNOSIS — M9903 Segmental and somatic dysfunction of lumbar region: Secondary | ICD-10-CM | POA: Diagnosis not present

## 2019-11-05 DIAGNOSIS — M9902 Segmental and somatic dysfunction of thoracic region: Secondary | ICD-10-CM | POA: Diagnosis not present

## 2019-11-19 DIAGNOSIS — M546 Pain in thoracic spine: Secondary | ICD-10-CM | POA: Diagnosis not present

## 2019-11-19 DIAGNOSIS — M9902 Segmental and somatic dysfunction of thoracic region: Secondary | ICD-10-CM | POA: Diagnosis not present

## 2019-11-19 DIAGNOSIS — M9903 Segmental and somatic dysfunction of lumbar region: Secondary | ICD-10-CM | POA: Diagnosis not present

## 2019-11-19 DIAGNOSIS — M545 Low back pain: Secondary | ICD-10-CM | POA: Diagnosis not present

## 2019-11-25 ENCOUNTER — Other Ambulatory Visit: Payer: Self-pay

## 2019-11-25 ENCOUNTER — Encounter: Payer: Self-pay | Admitting: Family Medicine

## 2019-11-25 ENCOUNTER — Ambulatory Visit: Payer: BC Managed Care – PPO | Admitting: Family Medicine

## 2019-11-25 VITALS — BP 116/74 | HR 76 | Temp 97.3°F | Ht 72.0 in | Wt 163.4 lb

## 2019-11-25 DIAGNOSIS — Z130 Encounter for screening for diseases of the blood and blood-forming organs and certain disorders involving the immune mechanism: Secondary | ICD-10-CM

## 2019-11-25 DIAGNOSIS — R748 Abnormal levels of other serum enzymes: Secondary | ICD-10-CM | POA: Diagnosis not present

## 2019-11-25 DIAGNOSIS — Z13220 Encounter for screening for lipoid disorders: Secondary | ICD-10-CM | POA: Diagnosis not present

## 2019-11-25 DIAGNOSIS — Z131 Encounter for screening for diabetes mellitus: Secondary | ICD-10-CM | POA: Diagnosis not present

## 2019-11-25 NOTE — Progress Notes (Signed)
    Patient ID: Daniel Burton, male    DOB: [birth year], 59 y.o.   MRN: [id number]   Chief Complaint  Patient presents with  . Discussion   Subjective:    HPI Pt here due to having a test done and the results came back with something wrong with liver. Pt had lab work done back on 08/04/19 through hospital and his AST and ALT were slightly elevated. No alcohol(pt stopped drinking a while ago); taking Ibuprofen every day.    Medical History Daniel Burton has a past medical history of Back pain with history of spinal surgery.   Outpatient Encounter Medications as of 11/25/2019  Medication Sig  . [DISCONTINUED] meclizine (ANTIVERT) 25 MG tablet Take 1 tablet (25 mg total) by mouth 4 (four) times daily as needed for dizziness.   No facility-administered encounter medications on file as of 11/25/2019.     Review of Systems  Constitutional: Negative for chills and fever.  HENT: Negative.   Eyes: Negative.   Respiratory: Negative for shortness of breath.   Cardiovascular: Negative for chest pain and leg swelling.  Gastrointestinal: Negative.   Endocrine: Negative.   Genitourinary: Negative.   Musculoskeletal: Negative.   Skin: Negative.   Neurological: Negative.   Hematological: Negative.   Psychiatric/Behavioral: Negative.      Vitals BP 116/74   Pulse 76   Temp (!) 97.3 F (36.3 C)   Ht 6' (1.829 m)   Wt 163 lb 6.4 oz (74.1 kg)   SpO2 97%   BMI 22.16 kg/m   Objective:   Physical Exam Constitutional:      Appearance: Normal appearance.  Cardiovascular:     Rate and Rhythm: Normal rate and regular rhythm.     Heart sounds: Normal heart sounds.  Pulmonary:     Effort: Pulmonary effort is normal.     Breath sounds: Normal breath sounds.  Skin:    General: Skin is warm and dry.  Neurological:     Mental Status: He is alert and oriented to person, place, and time.  Psychiatric:        Mood and Affect: Mood normal.        Behavior: Behavior normal.       Assessment and Plan   1. Abnormal liver enzymes - Comprehensive Metabolic Panel (CMET)  2. Screening for diabetes mellitus - HgB A1c  3. Screening for lipid disorders - Lipid Profile  4. Screening for deficiency anemia - CBC With Differential   Mr. Daniel Burton presents today to follow-up on labs that his insurance company drew and told him he had abnormal liver enzymes, lipid profile, and elevated A1C.   His last labs on file were from May and did not confirm the findings that the insurance company found. Mr. Daniel Burton wants to confirm these findings and discuss a plan for improving his health.  He wishes to avoid going on medications. Lifestyle modification and exercise discussed. Information given on DASH diet and preventing Type 2 diabetes. His blood pressure and body weight are good- I congratulated him on that fact.  He will start a walking routine, slowly and cautiously with the heat index currently. Instructed to stay hydrated.   Agrees with plan of care discussed today. Understands warning signs to seek further care: chest pain, shortness of breath.  Understands to follow-up for a physical with Dr. Lovena Burton. Will notify him with results as soon as they are available.   Daniel Guest, NP 11/25/2019

## 2019-11-25 NOTE — Patient Instructions (Signed)
Get your labs drawn on Friday before you eat We will discuss results and next steps. Get set up on My Chart Start walking.     Preventing Type 2 Diabetes Mellitus Type 2 diabetes (type 2 diabetes mellitus) is a long-term (chronic) disease that affects blood sugar (glucose) levels. Normally, a hormone called insulin allows glucose to enter cells in the body. The cells use glucose for energy. In type 2 diabetes, one or both of these problems may be present:  The body does not make enough insulin.  The body does not respond properly to insulin that it makes (insulin resistance). Insulin resistance or lack of insulin causes excess glucose to build up in the blood instead of going into cells. As a result, high blood glucose (hyperglycemia) develops, which can cause many complications. Being overweight or obese and having an inactive (sedentary) lifestyle can increase your risk for diabetes. Type 2 diabetes can be delayed or prevented by making certain nutrition and lifestyle changes. What nutrition changes can be made?   Eat healthy meals and snacks regularly. Keep a healthy snack with you for when you get hungry between meals, such as fruit or a handful of nuts.  Eat lean meats and proteins that are low in saturated fats, such as chicken, fish, egg whites, and beans. Avoid processed meats.  Eat plenty of fruits and vegetables and plenty of grains that have not been processed (whole grains). It is recommended that you eat: ? 1?2 cups of fruit every day. ? 2?3 cups of vegetables every day. ? 6?8 oz of whole grains every day, such as oats, whole wheat, bulgur, brown rice, quinoa, and millet.  Eat low-fat dairy products, such as milk, yogurt, and cheese.  Eat foods that contain healthy fats, such as nuts, avocado, olive oil, and canola oil.  Drink water throughout the day. Avoid drinks that contain added sugar, such as soda or sweet tea.  Follow instructions from your health care provider  about specific eating or drinking restrictions.  Control how much food you eat at a time (portion size). ? Check food labels to find out the serving sizes of foods. ? Use a kitchen scale to weigh amounts of foods.  Saute or steam food instead of frying it. Cook with water or broth instead of oils or butter.  Limit your intake of: ? Salt (sodium). Have no more than 1 tsp (2,400 mg) of sodium a day. If you have heart disease or high blood pressure, have less than ? tsp (1,500 mg) of sodium a day. ? Saturated fat. This is fat that is solid at room temperature, such as butter or fat on meat. What lifestyle changes can be made? Activity   Do moderate-intensity physical activity for at least 30 minutes on at least 5 days of the week, or as much as told by your health care provider.  Ask your health care provider what activities are safe for you. A mix of physical activities may be best, such as walking, swimming, cycling, and strength training.  Try to add physical activity into your day. For example: ? Park in spots that are farther away than usual, so that you walk more. For example, park in a far corner of the parking lot when you go to the office or the grocery store. ? Take a walk during your lunch break. ? Use stairs instead of elevators or escalators. Weight Loss  Lose weight as directed. Your health care provider can determine how much weight loss  is best for you and can help you lose weight safely.  If you are overweight or obese, you may be instructed to lose at least 5?7 % of your body weight. Alcohol and Tobacco   Limit alcohol intake to no more than 1 drink a day for nonpregnant women and 2 drinks a day for men. One drink equals 12 oz of beer, 5 oz of wine, or 1 oz of hard liquor.  Do not use any tobacco products, such as cigarettes, chewing tobacco, and e-cigarettes. If you need help quitting, ask your health care provider. Work With [facility] Provider  Have your  blood glucose tested regularly, as told by your health care provider.  Discuss your risk factors and how you can reduce your risk for diabetes.  Get screening tests as told by your health care provider. You may have screening tests regularly, especially if you have certain risk factors for type 2 diabetes.  Make an appointment with a diet and nutrition specialist (registered dietitian). A registered dietitian can help you make a healthy eating plan and can help you understand portion sizes and food labels. Why are these changes important?  It is possible to prevent or delay type 2 diabetes and related health problems by making lifestyle and nutrition changes.  It can be difficult to recognize signs of type 2 diabetes. The best way to avoid possible damage to your body is to take actions to prevent the disease before you develop symptoms. What can happen if changes are not made?  Your blood glucose levels may keep increasing. Having high blood glucose for a long time is dangerous. Too much glucose in your blood can damage your blood vessels, heart, kidneys, nerves, and eyes.  You may develop prediabetes or type 2 diabetes. Type 2 diabetes can lead to many chronic health problems and complications, such as: ? Heart disease. ? Stroke. ? Blindness. ? Kidney disease. ? Depression. ? Poor circulation in the feet and legs, which could lead to surgical removal (amputation) in severe cases. Where to find support  Ask your health care provider to recommend a registered dietitian, diabetes educator, or weight loss program.  Look for local or online weight loss groups.  Join a gym, fitness club, or outdoor activity group, such as a walking club. Where to find more information To learn more about diabetes and diabetes prevention, visit:  American Diabetes Association (ADA): www.diabetes.[employer] of Diabetes and Digestive and Kidney Diseases:  FindSpin.nl To learn more about healthy eating, visit:  The U.S. Department of Agriculture Scientist, research (physical sciences)), Choose My Plate: http://wiley-williams.com/  Office of Disease Prevention and Health Promotion (ODPHP), Dietary Guidelines: SurferLive.at Summary  You can reduce your risk for type 2 diabetes by increasing your physical activity, eating healthy foods, and losing weight as directed.  Talk with your health care provider about your risk for type 2 diabetes. Ask about any blood tests or screening tests that you need to have. This information is not intended to replace advice given to you by your health care provider. Make sure you discuss any questions you have with your health care provider. Document Revised: 07/05/2018 Document Reviewed: 05/04/2015 Elsevier Patient Education  [facility] DASH stands for "Dietary Approaches to Stop Hypertension." The DASH eating plan is a healthy eating plan that has been shown to reduce high blood pressure (hypertension). It may also reduce your risk for type 2 diabetes, heart disease, and stroke. The DASH eating plan may also  help with weight loss. What are tips for following this plan?  General guidelines  Avoid eating more than 2,300 mg (milligrams) of salt (sodium) a day. If you have hypertension, you may need to reduce your sodium intake to 1,500 mg a day.  Limit alcohol intake to no more than 1 drink a day for nonpregnant women and 2 drinks a day for men. One drink equals 12 oz of beer, 5 oz of wine, or 1 oz of hard liquor.  Work with your health care provider to maintain a healthy body weight or to lose weight. Ask what an ideal weight is for you.  Get at least 30 minutes of exercise that causes your heart to beat faster (aerobic exercise) most days of the week. Activities may include walking, swimming, or biking.  Work with your health care provider or diet and  nutrition specialist (dietitian) to adjust your eating plan to your individual calorie needs. Reading food labels   Check food labels for the amount of sodium per serving. Choose foods with less than 5 percent of the Daily Value of sodium. Generally, foods with less than 300 mg of sodium per serving fit into this eating plan.  To find whole grains, look for the word "whole" as the first word in the ingredient list. Shopping  Buy products labeled as "low-sodium" or "no salt added."  Buy fresh foods. Avoid canned foods and premade or frozen meals. Cooking  Avoid adding salt when cooking. Use salt-free seasonings or herbs instead of table salt or sea salt. Check with your health care provider or pharmacist before using salt substitutes.  Do not fry foods. Cook foods using healthy methods such as baking, boiling, grilling, and broiling instead.  Cook with heart-healthy oils, such as olive, canola, soybean, or sunflower oil. Meal planning  Eat a balanced diet that includes: ? 5 or more servings of fruits and vegetables each day. At each meal, try to fill half of your plate with fruits and vegetables. ? Up to 6-8 servings of whole grains each day. ? Less than 6 oz of lean meat, poultry, or fish each day. A 3-oz serving of meat is about the same size as a deck of cards. One egg equals 1 oz. ? 2 servings of low-fat dairy each day. ? A serving of nuts, seeds, or beans 5 times each week. ? Heart-healthy fats. Healthy fats called Omega-3 fatty acids are found in foods such as flaxseeds and coldwater fish, like sardines, salmon, and mackerel.  Limit how much you eat of the following: ? Canned or prepackaged foods. ? Food that is high in trans fat, such as fried foods. ? Food that is high in saturated fat, such as fatty meat. ? Sweets, desserts, sugary drinks, and other foods with added sugar. ? Full-fat dairy products.  Do not salt foods before eating.  Try to eat at least 2 vegetarian  meals each week.  Eat more home-cooked food and less restaurant, buffet, and fast food.  When eating at a restaurant, ask that your food be prepared with less salt or no salt, if possible. What foods are recommended? The items listed may not be a complete list. Talk with your dietitian about what dietary choices are best for you. Grains Whole-grain or whole-wheat bread. Whole-grain or whole-wheat pasta. Brown rice. Modena Morrow. Bulgur. Whole-grain and low-sodium cereals. Pita bread. Low-fat, low-sodium crackers. Whole-wheat flour tortillas. Vegetables Fresh or frozen vegetables (raw, steamed, roasted, or grilled). Low-sodium or reduced-sodium tomato and vegetable  juice. Low-sodium or reduced-sodium tomato sauce and tomato paste. Low-sodium or reduced-sodium canned vegetables. Fruits All fresh, dried, or frozen fruit. Canned fruit in natural juice (without added sugar). Meat and other protein foods Skinless chicken or [place]. Ground chicken or [place]. Pork with fat trimmed off. Fish and seafood. Egg whites. Dried beans, peas, or lentils. Unsalted nuts, nut butters, and seeds. Unsalted canned beans. Lean cuts of beef with fat trimmed off. Low-sodium, lean deli meat. Dairy Low-fat (1%) or fat-free (skim) milk. Fat-free, low-fat, or reduced-fat cheeses. Nonfat, low-sodium ricotta or cottage cheese. Low-fat or nonfat yogurt. Low-fat, low-sodium cheese. Fats and oils Soft margarine without trans fats. Vegetable oil. Low-fat, reduced-fat, or light mayonnaise and salad dressings (reduced-sodium). Canola, safflower, olive, soybean, and sunflower oils. Avocado. Seasoning and other foods Herbs. Spices. Seasoning mixes without salt. Unsalted popcorn and pretzels. Fat-free sweets. What foods are not recommended? The items listed may not be a complete list. Talk with your dietitian about what dietary choices are best for you. Grains Baked goods made with fat, such as croissants, muffins, or some  breads. Dry pasta or rice meal packs. Vegetables Creamed or fried vegetables. Vegetables in a cheese sauce. Regular canned vegetables (not low-sodium or reduced-sodium). Regular canned tomato sauce and paste (not low-sodium or reduced-sodium). Regular tomato and vegetable juice (not low-sodium or reduced-sodium). Angie Fava. Olives. Fruits Canned fruit in a light or heavy syrup. Fried fruit. Fruit in cream or butter sauce. Meat and other protein foods Fatty cuts of meat. Ribs. Fried meat. Berniece Salines. Sausage. Bologna and other processed lunch meats. Salami. Fatback. Hotdogs. Bratwurst. Salted nuts and seeds. Canned beans with added salt. Canned or smoked fish. Whole eggs or egg yolks. Chicken or [place] with skin. Dairy Whole or 2% milk, cream, and half-and-half. Whole or full-fat cream cheese. Whole-fat or sweetened yogurt. Full-fat cheese. Nondairy creamers. Whipped toppings. Processed cheese and cheese spreads. Fats and oils Butter. Stick margarine. Lard. Shortening. Ghee. Bacon fat. Tropical oils, such as coconut, palm kernel, or palm oil. Seasoning and other foods Salted popcorn and pretzels. Onion salt, garlic salt, seasoned salt, table salt, and sea salt. Worcestershire sauce. Tartar sauce. Barbecue sauce. Teriyaki sauce. Soy sauce, including reduced-sodium. Steak sauce. Canned and packaged gravies. Fish sauce. Oyster sauce. Cocktail sauce. Horseradish that you find on the shelf. Ketchup. Mustard. Meat flavorings and tenderizers. Bouillon cubes. Hot sauce and Tabasco sauce. Premade or packaged marinades. Premade or packaged taco seasonings. Relishes. Regular salad dressings. Where to find more information:  National Heart, Lung, and [place]: https://wilson-eaton.com/  American Heart Association: www.heart.org Summary  The DASH eating plan is a healthy eating plan that has been shown to reduce high blood pressure (hypertension). It may also reduce your risk for type 2 diabetes, heart disease, and  stroke.  With the DASH eating plan, you should limit salt (sodium) intake to 2,300 mg a day. If you have hypertension, you may need to reduce your sodium intake to 1,500 mg a day.  When on the DASH eating plan, aim to eat more fresh fruits and vegetables, whole grains, lean proteins, low-fat dairy, and heart-healthy fats.  Work with your health care provider or diet and nutrition specialist (dietitian) to adjust your eating plan to your individual calorie needs. This information is not intended to replace advice given to you by your health care provider. Make sure you discuss any questions you have with your health care provider. Document Revised: 02/23/2017 Document Reviewed: 03/06/2016 Elsevier Patient Education  2020 [employer].

## 2019-11-28 DIAGNOSIS — R748 Abnormal levels of other serum enzymes: Secondary | ICD-10-CM | POA: Diagnosis not present

## 2019-11-28 DIAGNOSIS — J343 Hypertrophy of nasal turbinates: Secondary | ICD-10-CM | POA: Diagnosis not present

## 2019-11-28 DIAGNOSIS — Z131 Encounter for screening for diabetes mellitus: Secondary | ICD-10-CM | POA: Diagnosis not present

## 2019-11-28 DIAGNOSIS — J342 Deviated nasal septum: Secondary | ICD-10-CM | POA: Diagnosis not present

## 2019-11-28 DIAGNOSIS — J310 Chronic rhinitis: Secondary | ICD-10-CM | POA: Diagnosis not present

## 2019-11-28 DIAGNOSIS — Z130 Encounter for screening for diseases of the blood and blood-forming organs and certain disorders involving the immune mechanism: Secondary | ICD-10-CM | POA: Diagnosis not present

## 2019-11-28 DIAGNOSIS — Z13220 Encounter for screening for lipoid disorders: Secondary | ICD-10-CM | POA: Diagnosis not present

## 2019-11-29 LAB — COMPREHENSIVE METABOLIC PANEL
ALT: 22 IU/L (ref 0–44)
AST: 24 IU/L (ref 0–40)
Albumin/Globulin Ratio: 1.9 (ref 1.2–2.2)
Albumin: 4.8 g/dL (ref 3.8–4.9)
Alkaline Phosphatase: 112 IU/L (ref 48–121)
BUN/Creatinine Ratio: 17 (ref 9–20)
BUN: 16 mg/dL (ref 6–24)
Bilirubin Total: 0.4 mg/dL (ref 0.0–1.2)
CO2: 25 mmol/L (ref 20–29)
Calcium: 9.7 mg/dL (ref 8.7–10.2)
Chloride: 108 mmol/L — ABNORMAL HIGH (ref 96–106)
Creatinine, Ser: 0.96 mg/dL (ref 0.76–1.27)
GFR calc Af Amer: 100 mL/min/{1.73_m2} (ref >59)
GFR calc non Af Amer: 87 mL/min/{1.73_m2} (ref >59)
Globulin, Total: 2.5 g/dL (ref 1.5–4.5)
Glucose: 102 mg/dL — ABNORMAL HIGH (ref 65–99)
Potassium: 4.3 mmol/L (ref 3.5–5.2)
Sodium: 146 mmol/L — ABNORMAL HIGH (ref 134–144)
Total Protein: 7.3 g/dL (ref 6.0–8.5)

## 2019-11-29 LAB — CBC WITH DIFFERENTIAL
Basophils Absolute: 0 10*3/uL (ref 0.0–0.2)
Basos: 0 %
EOS (ABSOLUTE): 0.2 10*3/uL (ref 0.0–0.4)
Eos: 3 %
Hematocrit: 41.5 % (ref 37.5–51.0)
Hemoglobin: 13.5 g/dL (ref 13.0–17.7)
Immature Grans (Abs): 0 10*3/uL (ref 0.0–0.1)
Immature Granulocytes: 0 %
Lymphocytes Absolute: 2.6 10*3/uL (ref 0.7–3.1)
Lymphs: 45 %
MCH: 27.1 pg (ref 26.6–33.0)
MCHC: 32.5 g/dL (ref 31.5–35.7)
MCV: 83 fL (ref 79–97)
Monocytes Absolute: 0.4 10*3/uL (ref 0.1–0.9)
Monocytes: 7 %
Neutrophils Absolute: 2.6 10*3/uL (ref 1.4–7.0)
Neutrophils: 45 %
RBC: 4.98 x10E6/uL (ref 4.14–5.80)
RDW: 14.3 % (ref 11.6–15.4)
WBC: 5.7 10*3/uL (ref 3.4–10.8)

## 2019-11-29 LAB — LIPID PANEL
Chol/HDL Ratio: 5.6 ratio — ABNORMAL HIGH (ref 0.0–5.0)
Cholesterol, Total: 286 mg/dL — ABNORMAL HIGH (ref 100–199)
HDL: 51 mg/dL (ref >39)
LDL Chol Calc (NIH): 220 mg/dL — ABNORMAL HIGH (ref 0–99)
Triglycerides: 88 mg/dL (ref 0–149)
VLDL Cholesterol Cal: 15 mg/dL (ref 5–40)

## 2019-11-29 LAB — HEMOGLOBIN A1C
Est. average glucose Bld gHb Est-mCnc: 137 mg/dL
Hgb A1c MFr Bld: 6.4 % — ABNORMAL HIGH (ref 4.8–5.6)

## 2019-12-03 ENCOUNTER — Telehealth: Payer: Self-pay | Admitting: Family Medicine

## 2019-12-03 NOTE — Telephone Encounter (Signed)
Pt checking on lab work

## 2019-12-03 NOTE — Telephone Encounter (Signed)
Nurses,  Please let Daniel Burton know that his labs are back.  He has areas that will need discussion and improvement.  His has pre-diabetes and his cholesterol and LDL need improvement.  His kidney and liver functions are good.  Continue with lifestyle modification like we discussed at his appointment.  He will follow-up with Dr. Lovena Le.  Santiago Glad

## 2019-12-03 NOTE — Telephone Encounter (Signed)
Pt contacted and verbalized understanding. Pt is coming by office to pick up a copy of results.

## 2019-12-03 NOTE — Telephone Encounter (Signed)
Please advise. Thank you

## 2019-12-09 ENCOUNTER — Encounter: Payer: Self-pay | Admitting: Family Medicine

## 2019-12-09 DIAGNOSIS — E785 Hyperlipidemia, unspecified: Secondary | ICD-10-CM | POA: Insufficient documentation

## 2019-12-09 DIAGNOSIS — E7849 Other hyperlipidemia: Secondary | ICD-10-CM | POA: Insufficient documentation

## 2020-01-09 ENCOUNTER — Ambulatory Visit (INDEPENDENT_AMBULATORY_CARE_PROVIDER_SITE_OTHER): Payer: BC Managed Care – PPO | Admitting: Family Medicine

## 2020-01-09 ENCOUNTER — Encounter: Payer: Self-pay | Admitting: Family Medicine

## 2020-01-09 VITALS — BP 130/84 | HR 75 | Temp 98.3°F | Ht 72.0 in | Wt 163.0 lb

## 2020-01-09 DIAGNOSIS — E785 Hyperlipidemia, unspecified: Secondary | ICD-10-CM | POA: Diagnosis not present

## 2020-01-09 DIAGNOSIS — G8929 Other chronic pain: Secondary | ICD-10-CM | POA: Diagnosis not present

## 2020-01-09 DIAGNOSIS — M545 Low back pain, unspecified: Secondary | ICD-10-CM | POA: Diagnosis not present

## 2020-01-09 DIAGNOSIS — Z0000 Encounter for general adult medical examination without abnormal findings: Secondary | ICD-10-CM | POA: Diagnosis not present

## 2020-01-09 MED ORDER — ATORVASTATIN CALCIUM 20 MG PO TABS: 20.0000 mg | ORAL_TABLET | Freq: Every day | ORAL | 1 refills | Status: DC

## 2020-01-09 NOTE — Progress Notes (Signed)
Patient ID: Daniel Burton, male    DOB: 07/30/1960, 59 y.o.   MRN: [id number]   Chief Complaint  Patient presents with  . Annual Exam   Subjective:    HPI The patient comes in today for a wellness visit.  A review of their health history was completed.  A review of medications was also completed.  Any needed refills; none  Eating habits: doing better  Falls/  MVA accidents in past few months: none  Regular exercise: was walking but not as good recently  Specialist pt sees on regular basis: none  Preventative health issues were discussed.   Additional concerns: discuss recent labs  Having chronic low back pain.  International aid/development worker.  Was going weekly, hasn't been in a while. meds- none. Years ago hurt it at work with lifting. Worse in am.  Moving around then improves. Yesterday took 400mg  ibuprofen. Pain radiates in lower legs. H/o surgery 03/04/20- surgery neck cervical surgery, mri cervical 11/20.  HLD- pt has elevated cholesterol 286 and LDL 220. Was told in past was elevated and needing to start meds.  Pt declined and wanted to do diet modification. Labs 11/20 showing high numbers also cholesterol 265 and LDL 220, HDL 51.   Medical History Daniel Burton has a past medical history of Back pain with history of spinal surgery.   Outpatient Encounter Medications as of 01/09/2020  Medication Sig  . atorvastatin (LIPITOR) 20 MG tablet Take 1 tablet (20 mg total) by mouth daily.   No facility-administered encounter medications on file as of 01/09/2020.     Review of Systems  Constitutional: Negative for chills and fever.  HENT: Negative for congestion, rhinorrhea and sore throat.   Respiratory: Negative for cough, shortness of breath and wheezing.   Cardiovascular: Negative for chest pain and leg swelling.  Gastrointestinal: Negative for abdominal pain, diarrhea, nausea and vomiting.  Genitourinary: Negative for dysuria and frequency.  Skin: Negative for rash.    Neurological: Negative for dizziness, weakness and headaches.     Vitals BP 130/84   Pulse 75   Temp 98.3 F (36.8 C) (Oral)   Ht 6' (1.829 m)   Wt 163 lb (73.9 kg)   SpO2 98%   BMI 22.11 kg/m   Objective:   Physical Exam Vitals and nursing note reviewed.  Constitutional:      General: He is not in acute distress.    Appearance: Normal appearance. He is not ill-appearing.  HENT:     Head: Normocephalic.     Nose: Nose normal. No congestion.     Mouth/Throat:     Mouth: Mucous membranes are moist.     Pharynx: No oropharyngeal exudate.  Eyes:     Extraocular Movements: Extraocular movements intact.     Conjunctiva/sclera: Conjunctivae normal.     Pupils: Pupils are equal, round, and reactive to light.  Cardiovascular:     Rate and Rhythm: Normal rate and regular rhythm.     Pulses: Normal pulses.     Heart sounds: Normal heart sounds. No murmur heard.   Pulmonary:     Effort: Pulmonary effort is normal.     Breath sounds: Normal breath sounds. No wheezing, rhonchi or rales.  Musculoskeletal:        General: Normal range of motion.     Right lower leg: No edema.     Left lower leg: No edema.  Skin:    General: Skin is warm and dry.     Findings: No rash.  Neurological:     General: No focal deficit present.     Mental Status: He is alert and oriented to person, place, and time.     Cranial Nerves: No cranial nerve deficit.  Psychiatric:        Mood and Affect: Mood normal.        Behavior: Behavior normal.        Thought Content: Thought content normal.        Judgment: Judgment normal.      Assessment and Plan   1. Well adult exam  2. Hyperlipidemia, unspecified hyperlipidemia type - atorvastatin (LIPITOR) 20 MG tablet; Take 1 tablet (20 mg total) by mouth daily.  Dispense: 90 tablet; Refill: 1  3. Chronic low back pain without sciatica, unspecified back pain laterality   HLD- pt reluctant to take medication for his cholesterol.  It is very high  and has been elevated in our chart since 2019.  Pt stated was told by last pcp needing to start a medictaion for it. But wanted to do diet modification.  Now 2 yrs later still high and worsening.  I am recommending lipitor 20mg  and sent script to pharmacy and advised pt it's his choice, but would recommend staring this now and working toward the diet changes he wants to make.    Pt stating he would try the medication.  But then stating he thinks can get it down on his own with diet modifications.  Sent script and told pt he can decide to pick it up and try it.  Reviewed risk vs benefit of taking statin to help avoid heart attack and strokes. reviewing the ASCVD calculator they recommend any LDL over 190 should start a moderate to high intensity statin.  Chronic back pain- Pt wanting to think about his back pain, not wanting to take more medications for the back at this time.  Using 400mg  ibuprofen prn and working and cont to Probation officer.  F/u 62mo or prn.

## 2020-01-09 NOTE — Patient Instructions (Addendum)
Fat and Cholesterol Restricted Eating Plan Getting too much fat and cholesterol in your diet may cause health problems. Choosing the right foods helps keep your fat and cholesterol at normal levels. This can keep you from getting certain diseases. Your doctor may recommend an eating plan that includes:  Total fat: ______% or less of total calories a day.  Saturated fat: ______% or less of total calories a day.  Cholesterol: less than _________mg a day.  Fiber: ______g a day. What are tips for following this plan? Meal planning  At meals, divide your plate into four equal parts: ? Fill one-half of your plate with vegetables and green salads. ? Fill one-fourth of your plate with whole grains. ? Fill one-fourth of your plate with low-fat (lean) protein foods.  Eat fish that is high in omega-3 fats at least two times a week. This includes mackerel, tuna, sardines, and salmon.  Eat foods that are high in fiber, such as whole grains, beans, apples, broccoli, carrots, peas, and barley. General tips   Work with your doctor to lose weight if you need to.  Avoid: ? Foods with added sugar. ? Fried foods. ? Foods with partially hydrogenated oils.  Limit alcohol intake to no more than 1 drink a day for nonpregnant women and 2 drinks a day for men. One drink equals 12 oz of beer, 5 oz of wine, or 1 oz of hard liquor. Reading food labels  Check food labels for: ? Trans fats. ? Partially hydrogenated oils. ? Saturated fat (g) in each serving. ? Cholesterol (mg) in each serving. ? Fiber (g) in each serving.  Choose foods with healthy fats, such as: ? Monounsaturated fats. ? Polyunsaturated fats. ? Omega-3 fats.  Choose grain products that have whole grains. Look for the word "whole" as the first word in the ingredient list. Cooking  Cook foods using low-fat methods. These include baking, boiling, grilling, and broiling.  Eat more home-cooked foods. Eat at restaurants and buffets  less often.  Avoid cooking using saturated fats, such as butter, cream, palm oil, palm kernel oil, and coconut oil. Recommended foods  Fruits  All fresh, canned (in natural juice), or frozen fruits. Vegetables  Fresh or frozen vegetables (raw, steamed, roasted, or grilled). Green salads. Grains  Whole grains, such as whole wheat or whole grain breads, crackers, cereals, and pasta. Unsweetened oatmeal, bulgur, barley, quinoa, or brown rice. Corn or whole wheat flour tortillas. Meats and other protein foods  Ground beef (85% or leaner), grass-fed beef, or beef trimmed of fat. Skinless chicken or turkey. Ground chicken or turkey. Pork trimmed of fat. All fish and seafood. Egg whites. Dried beans, peas, or lentils. Unsalted nuts or seeds. Unsalted canned beans. Nut butters without added sugar or oil. Dairy  Low-fat or nonfat dairy products, such as skim or 1% milk, 2% or reduced-fat cheeses, low-fat and fat-free ricotta or cottage cheese, or plain low-fat and nonfat yogurt. Fats and oils  Tub margarine without trans fats. Light or reduced-fat mayonnaise and salad dressings. Avocado. Olive, canola, sesame, or safflower oils. The items listed above may not be a complete list of foods and beverages you can eat. Contact a dietitian for more information. Foods to avoid Fruits  Canned fruit in heavy syrup. Fruit in cream or butter sauce. Fried fruit. Vegetables  Vegetables cooked in cheese, cream, or butter sauce. Fried vegetables. Grains  White bread. White pasta. White rice. Cornbread. Bagels, pastries, and croissants. Crackers and snack foods that contain trans fat   fat and hydrogenated oils. Meats and other protein foods  Fatty cuts of meat. Ribs, chicken wings, bacon, sausage, bologna, salami, chitterlings, fatback, hot dogs, bratwurst, and packaged lunch meats. Liver and organ meats. Whole eggs and egg yolks. Chicken and turkey with skin. Fried meat. Dairy  Whole or 2% milk,  cream, half-and-half, and cream cheese. Whole milk cheeses. Whole-fat or sweetened yogurt. Full-fat cheeses. Nondairy creamers and whipped toppings. Processed cheese, cheese spreads, and cheese curds. Beverages  Alcohol. Sugar-sweetened drinks such as sodas, lemonade, and fruit drinks. Fats and oils  Butter, stick margarine, lard, shortening, ghee, or bacon fat. Coconut, palm kernel, and palm oils. Sweets and desserts  Corn syrup, sugars, honey, and molasses. Candy. Jam and jelly. Syrup. Sweetened cereals. Cookies, pies, cakes, donuts, muffins, and ice cream. The items listed above may not be a complete list of foods and beverages you should avoid. Contact a dietitian for more information. Summary  Choosing the right foods helps keep your fat and cholesterol at normal levels. This can keep you from getting certain diseases.  At meals, fill one-half of your plate with vegetables and green salads.  Eat high-fiber foods, like whole grains, beans, apples, carrots, peas, and barley.  Limit added sugar, saturated fats, alcohol, and fried foods. This information is not intended to replace advice given to you by your health care provider. Make sure you discuss any questions you have with your health care provider. Document Revised: 11/14/2017 Document Reviewed: 11/28/2016 Elsevier Patient Education  2020 [employer]  High Cholesterol  High cholesterol is a condition in which the blood has high levels of a white, waxy, fat-like substance (cholesterol). The human body needs small amounts of cholesterol. The liver makes all the cholesterol that the body needs. Extra (excess) cholesterol comes from the food that we eat. Cholesterol is carried from the liver by the blood through the blood vessels. If you have high cholesterol, deposits (plaques) may build up on the walls of your blood vessels (arteries). Plaques make the arteries narrower and stiffer. Cholesterol plaques increase your risk for heart  attack and stroke. Work with your health care provider to keep your cholesterol levels in a healthy range. What increases the risk? This condition is more likely to develop in people who:  Eat foods that are high in animal fat (saturated fat) or cholesterol.  Are overweight.  Are not getting enough exercise.  Have a family history of high cholesterol. What are the signs or symptoms? There are no symptoms of this condition. How is this diagnosed? This condition may be diagnosed from the results of a blood test.  If you are older than age 20, your health care provider may check your cholesterol every 4-6 years.  You may be checked more often if you already have high cholesterol or other risk factors for heart disease. The blood test for cholesterol measures:  "Bad" cholesterol (LDL cholesterol). This is the main type of cholesterol that causes heart disease. The desired level for LDL is less than 100.  "Good" cholesterol (HDL cholesterol). This type helps to protect against heart disease by cleaning the arteries and carrying the LDL away. The desired level for HDL is 60 or higher.  Triglycerides. These are fats that the body can store or burn for energy. The desired number for triglycerides is lower than 150.  Total cholesterol. This is a measure of the total amount of cholesterol in your blood, including LDL cholesterol, HDL cholesterol, and triglycerides. A healthy number is less than   200. How is this treated? This condition is treated with diet changes, lifestyle changes, and medicines. Diet changes  This may include eating more whole grains, fruits, vegetables, nuts, and fish.  This may also include cutting back on red meat and foods that have a lot of added sugar. Lifestyle changes  Changes may include getting at least 40 minutes of aerobic exercise 3 times a week. Aerobic exercises include walking, biking, and swimming. Aerobic exercise along with a healthy diet can help you  maintain a healthy weight.  Changes may also include quitting smoking. Medicines  Medicines are usually given if diet and lifestyle changes have failed to reduce your cholesterol to healthy levels.  Your health care provider may prescribe a statin medicine. Statin medicines have been shown to reduce cholesterol, which can reduce the risk of heart disease. Follow these instructions at home: Eating and drinking If told by your health care provider:  Eat chicken (without skin), fish, veal, shellfish, ground turkey breast, and round or loin cuts of red meat.  Do not eat fried foods or fatty meats, such as hot dogs and salami.  Eat plenty of fruits, such as apples.  Eat plenty of vegetables, such as broccoli, potatoes, and carrots.  Eat beans, peas, and lentils.  Eat grains such as barley, rice, couscous, and bulgur wheat.  Eat pasta without cream sauces.  Use skim or nonfat milk, and eat low-fat or nonfat yogurt and cheeses.  Do not eat or drink whole milk, cream, ice cream, egg yolks, or hard cheeses.  Do not eat stick margarine or tub margarines that contain trans fats (also called partially hydrogenated oils).  Do not eat saturated tropical oils, such as coconut oil and palm oil.  Do not eat cakes, cookies, crackers, or other baked goods that contain trans fats.  General instructions  Exercise as directed by your health care provider. Increase your activity level with activities such as gardening, walking, and taking the stairs.  Take over-the-counter and prescription medicines only as told by your health care provider.  Do not use any products that contain nicotine or tobacco, such as cigarettes and e-cigarettes. If you need help quitting, ask your health care provider.  Keep all follow-up visits as told by your health care provider. This is important. Contact a health care provider if:  You are struggling to maintain a healthy diet or weight.  You need help to start  on an exercise program.  You need help to stop smoking. Get help right away if:  You have chest pain.  You have trouble breathing. This information is not intended to replace advice given to you by your health care provider. Make sure you discuss any questions you have with your health care provider. Document Revised: 03/16/2017 Document Reviewed: 09/11/2015 Elsevier Patient Education  2020 [employer] Inc.  

## 2020-01-15 DIAGNOSIS — J342 Deviated nasal septum: Secondary | ICD-10-CM | POA: Diagnosis not present

## 2020-01-15 DIAGNOSIS — J310 Chronic rhinitis: Secondary | ICD-10-CM | POA: Diagnosis not present

## 2020-01-15 DIAGNOSIS — J343 Hypertrophy of nasal turbinates: Secondary | ICD-10-CM | POA: Diagnosis not present

## 2020-01-19 ENCOUNTER — Other Ambulatory Visit: Payer: Self-pay | Admitting: Otolaryngology

## 2020-01-19 DIAGNOSIS — J329 Chronic sinusitis, unspecified: Secondary | ICD-10-CM

## 2020-01-28 ENCOUNTER — Ambulatory Visit (HOSPITAL_COMMUNITY)
Admission: RE | Admit: 2020-01-28 | Discharge: 2020-01-28 | Disposition: A | Discharge status: Home / Self Care | Payer: BC Managed Care – PPO | Source: Ambulatory Visit | Attending: Otolaryngology | Admitting: Otolaryngology

## 2020-01-28 ENCOUNTER — Other Ambulatory Visit: Payer: Self-pay

## 2020-01-28 DIAGNOSIS — J329 Chronic sinusitis, unspecified: Secondary | ICD-10-CM | POA: Diagnosis not present

## 2020-01-28 DIAGNOSIS — R519 Headache, unspecified: Secondary | ICD-10-CM | POA: Diagnosis not present

## 2020-01-28 DIAGNOSIS — M545 Low back pain, unspecified: Secondary | ICD-10-CM | POA: Diagnosis not present

## 2020-01-28 DIAGNOSIS — M47816 Spondylosis without myelopathy or radiculopathy, lumbar region: Secondary | ICD-10-CM | POA: Diagnosis not present

## 2020-01-28 IMAGING — CT CT MAXILLOFACIAL W/O CM
3 of 5 series · 14 of 47 positions shown, 17 images · non-contrast
Comparison: None.

CLINICAL DATA: Cervical spine surgery [DATE]. Subsequent
head throbbing, worse on the right.

EXAM:
CT MAXILLOFACIAL WITHOUT CONTRAST
TECHNIQUE: Multidetector CT imaging of the maxillofacial structures was
performed. Multiplanar CT image reconstructions were also generated.

[Series 2: standard · axial · 0.34mm/px · z∈[-33,+61]mm · 9 of 110 slices shown, 12 images]
[im 8/110  brain]
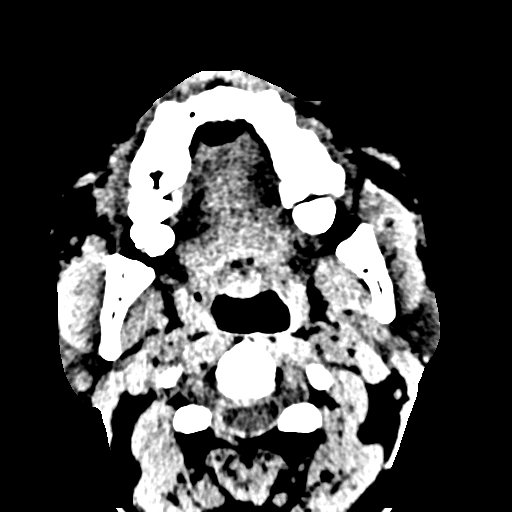
[im 8/110  bone]
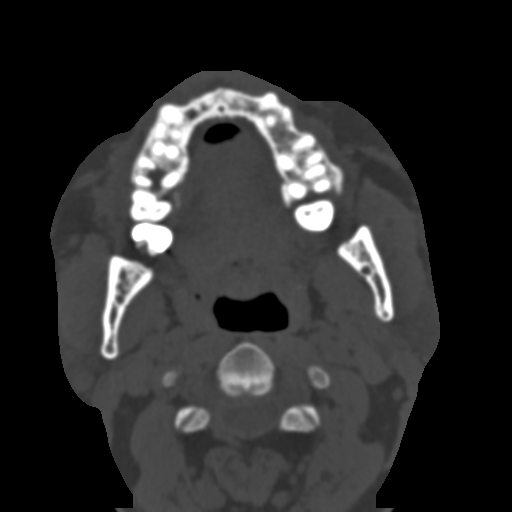
[im 19/110  bone]
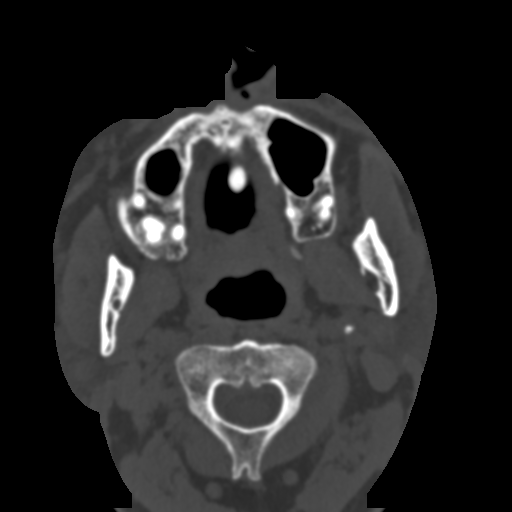
[im 31/110  bone]
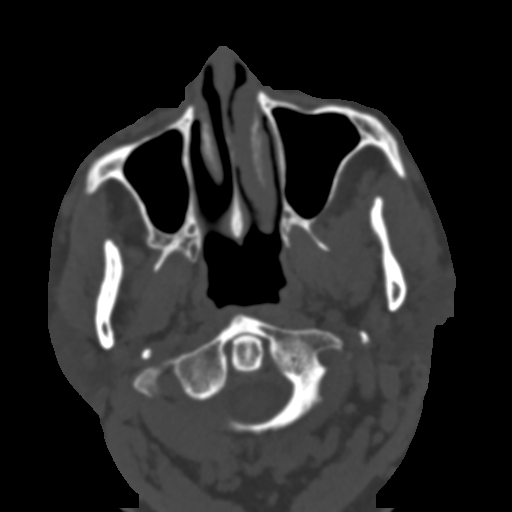
[im 42/110  bone]
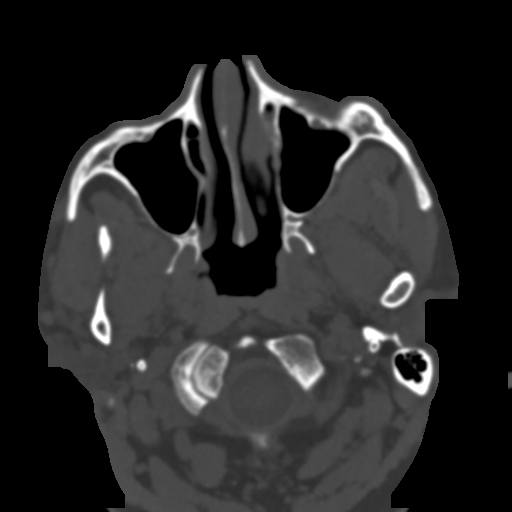
[im 57/110  brain]
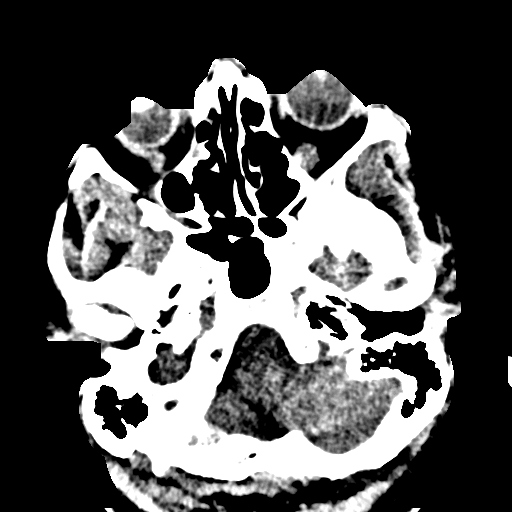
[im 57/110  bone]
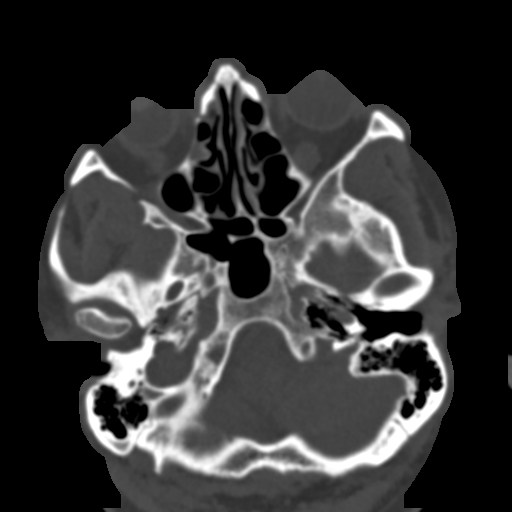
[im 68/110  bone]
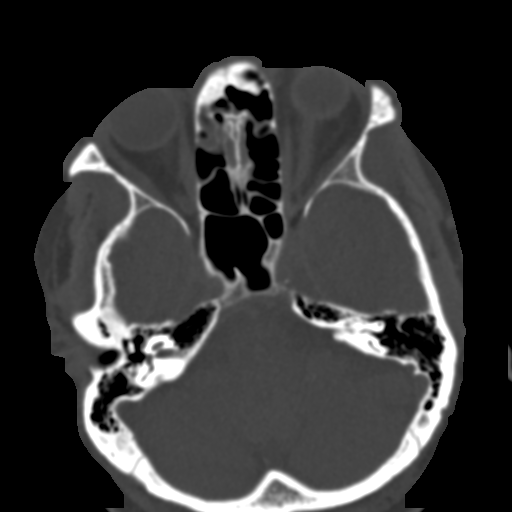
[im 79/110  bone]
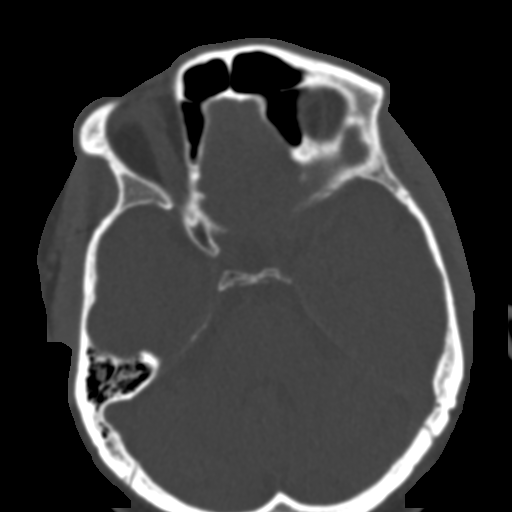
[im 91/110  bone]
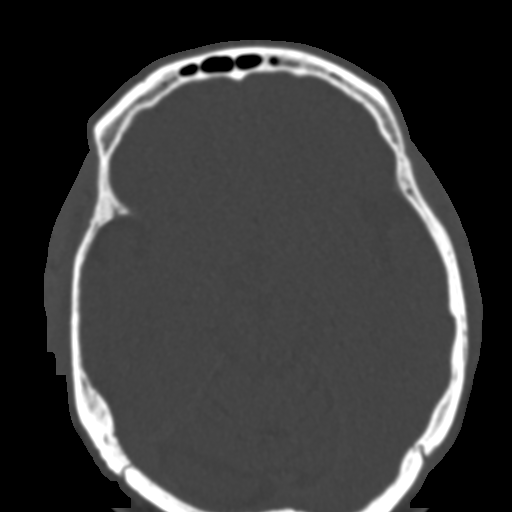
[im 102/110  brain]
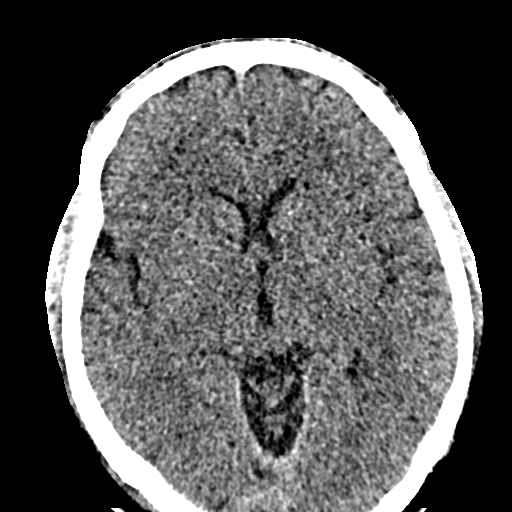
[im 102/110  bone]
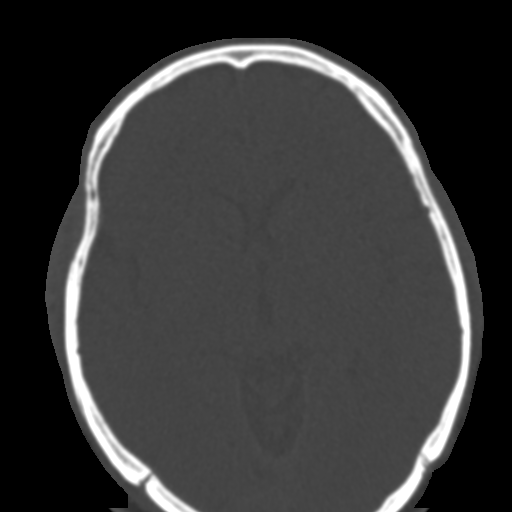

[Series 6: cor bone · coronal · 0.22mm/px · 3 of 98 slices shown]
[im 25/98  bone]
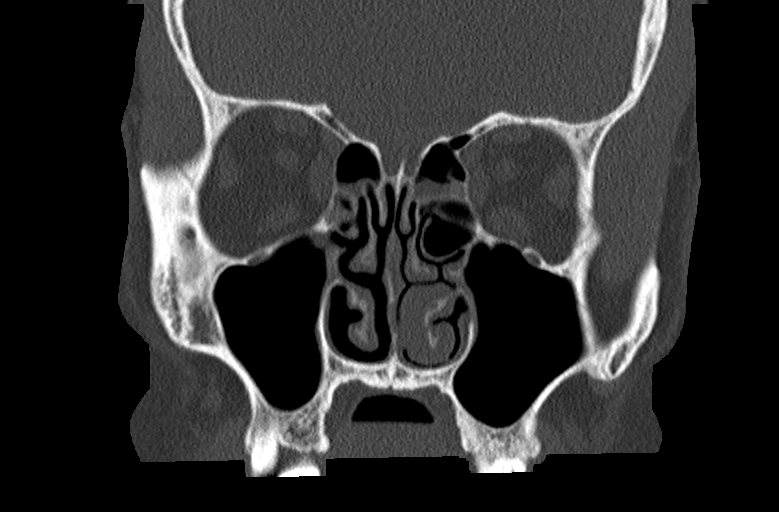
[im 49/98  bone]
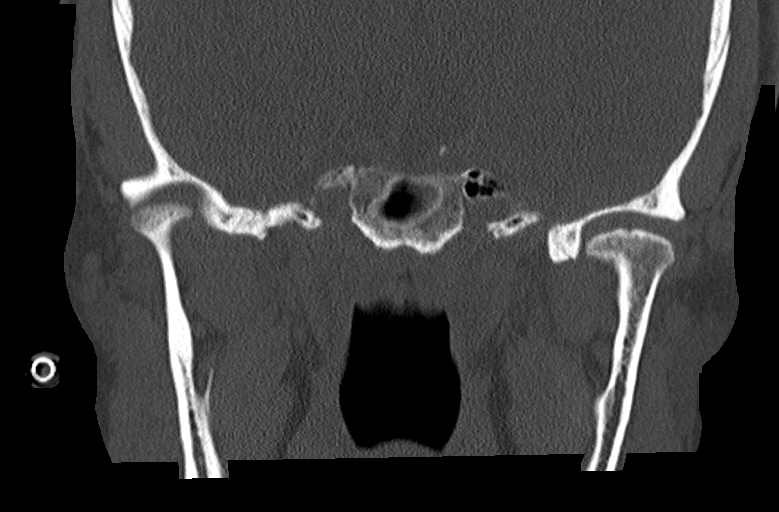
[im 73/98  bone]
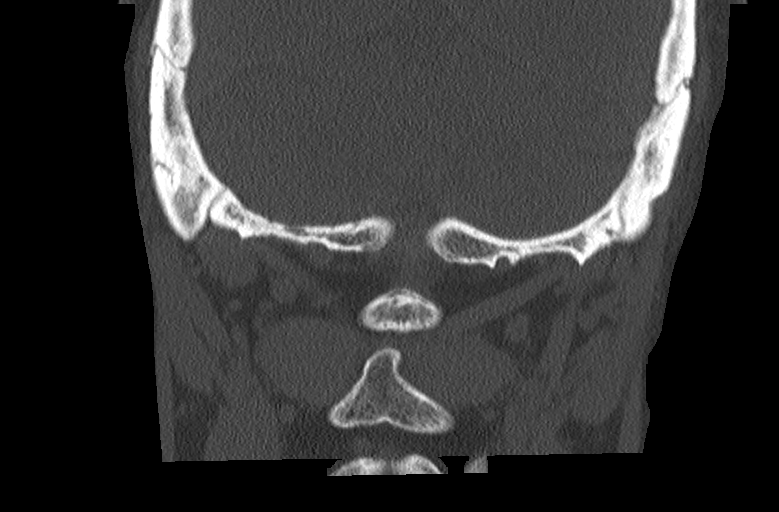

[Series 7: sag bone · sagittal · 0.22mm/px · 2 of 87 slices shown]
[im 29/87  bone]
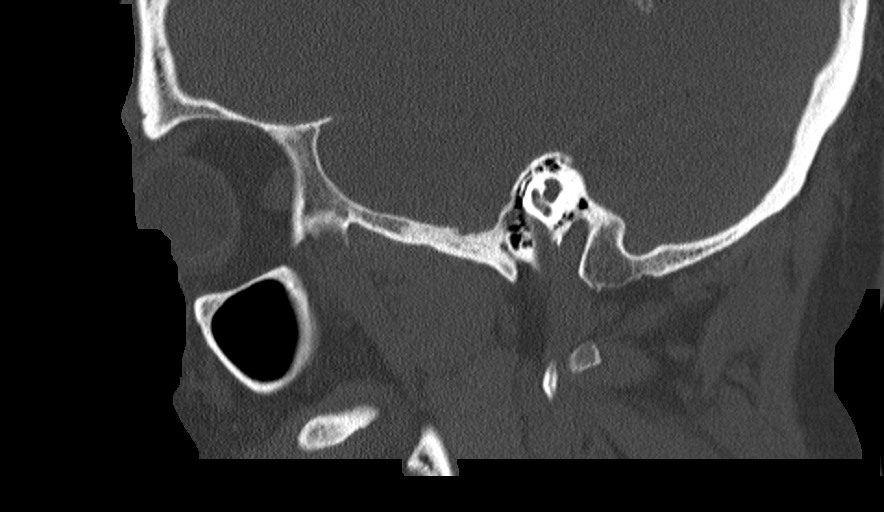
[im 58/87  bone]
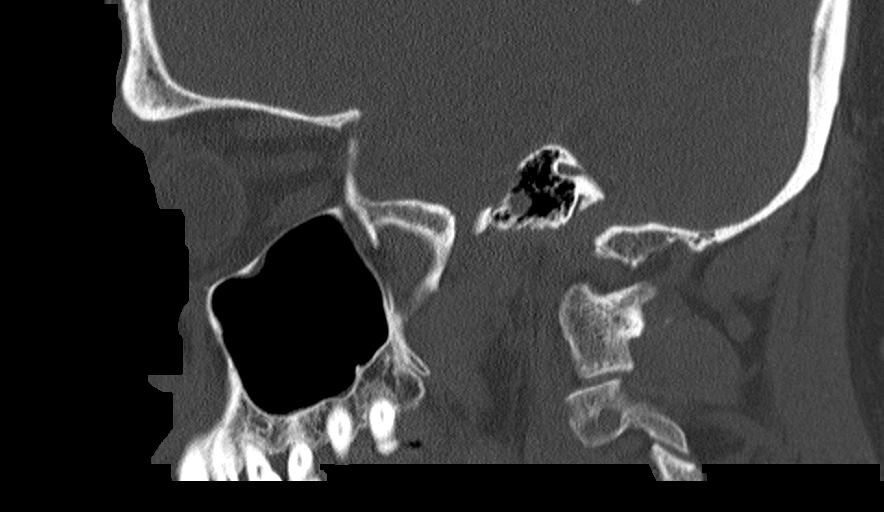

[14 of 47 positions shown; findings below may reference images not displayed]

FINDINGS: Osseous: No regional osseous abnormality.

Orbits: Normal

Sinuses: Frontal sinuses are clear. Few scattered opacified ethmoid
air cells without advanced disease. Maxillary sinuses are clear.
Sphenoid sinus is clear. Both ostiomeatal units are sufficiently
patent. Adjacent ethmoid bulla on the left. Nasal passages are
patent.

Soft tissues: Otherwise negative

Limited intracranial: Normal
IMPRESSION: Few scattered opacified ethmoid air cells without advanced disease.

## 2020-01-29 DIAGNOSIS — M542 Cervicalgia: Secondary | ICD-10-CM | POA: Diagnosis not present

## 2020-02-02 ENCOUNTER — Other Ambulatory Visit: Payer: Self-pay | Admitting: Student

## 2020-02-02 DIAGNOSIS — M47816 Spondylosis without myelopathy or radiculopathy, lumbar region: Secondary | ICD-10-CM | POA: Diagnosis not present

## 2020-02-02 DIAGNOSIS — M542 Cervicalgia: Secondary | ICD-10-CM

## 2020-02-02 DIAGNOSIS — M545 Low back pain, unspecified: Secondary | ICD-10-CM | POA: Diagnosis not present

## 2020-02-06 ENCOUNTER — Other Ambulatory Visit (HOSPITAL_COMMUNITY): Payer: Self-pay | Admitting: Surgery

## 2020-02-06 ENCOUNTER — Other Ambulatory Visit: Payer: Self-pay | Admitting: Surgery

## 2020-02-06 DIAGNOSIS — M47816 Spondylosis without myelopathy or radiculopathy, lumbar region: Secondary | ICD-10-CM | POA: Diagnosis not present

## 2020-02-06 DIAGNOSIS — M545 Low back pain, unspecified: Secondary | ICD-10-CM | POA: Diagnosis not present

## 2020-02-06 DIAGNOSIS — R1032 Left lower quadrant pain: Secondary | ICD-10-CM

## 2020-02-09 ENCOUNTER — Other Ambulatory Visit: Payer: Self-pay | Admitting: Surgery

## 2020-02-09 ENCOUNTER — Other Ambulatory Visit (HOSPITAL_COMMUNITY): Payer: Self-pay | Admitting: Surgery

## 2020-02-09 DIAGNOSIS — R1011 Right upper quadrant pain: Secondary | ICD-10-CM

## 2020-02-11 ENCOUNTER — Other Ambulatory Visit: Payer: Self-pay

## 2020-02-11 ENCOUNTER — Ambulatory Visit (HOSPITAL_COMMUNITY)
Admission: RE | Admit: 2020-02-11 | Discharge: 2020-02-11 | Disposition: A | Discharge status: Home / Self Care | Payer: BC Managed Care – PPO | Source: Ambulatory Visit | Attending: Surgery | Admitting: Surgery

## 2020-02-11 DIAGNOSIS — R1032 Left lower quadrant pain: Secondary | ICD-10-CM | POA: Diagnosis not present

## 2020-02-11 DIAGNOSIS — M1612 Unilateral primary osteoarthritis, left hip: Secondary | ICD-10-CM | POA: Diagnosis not present

## 2020-02-11 DIAGNOSIS — K429 Umbilical hernia without obstruction or gangrene: Secondary | ICD-10-CM | POA: Diagnosis not present

## 2020-02-11 LAB — POCT I-STAT CREATININE: Creatinine, Ser: 1 mg/dL (ref 0.61–1.24)

## 2020-02-11 IMAGING — CT CT ABD-PELV W/ CM
2 of 6 series · 16 of 46 positions shown, 18 images · IV contrast (Omnipaque or Isovue)
Comparison: None.

CLINICAL DATA: 58-year-old male with left lower quadrant abdominal
pain.

EXAM:
CT ABDOMEN AND PELVIS WITH CONTRAST
TECHNIQUE: Multidetector CT imaging of the abdomen and pelvis was performed
using the standard protocol following bolus administration of
intravenous contrast.
CONTRAST:  100mL OMNIPAQUE IOHEXOL 300 MG/ML  SOLN

[Series 2: axial st · axial · 0.72mm/px · z∈[+857,+1252]mm · 13 of 91 slices shown, 15 images]
[im 6/91  soft-tissue]
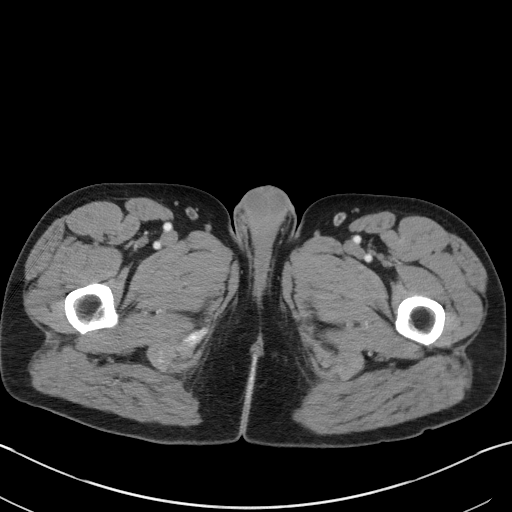
[im 6/91  bone]
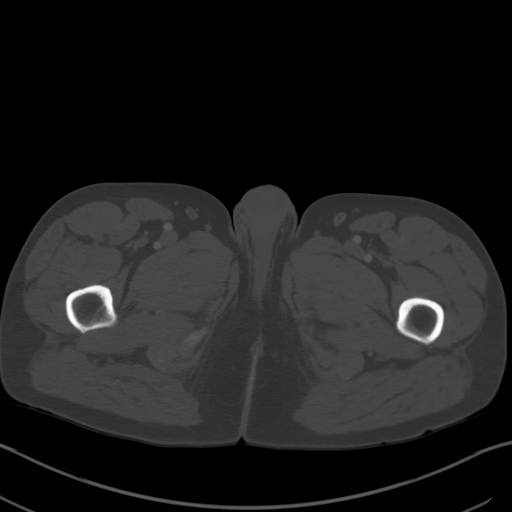
[im 12/91  soft-tissue]
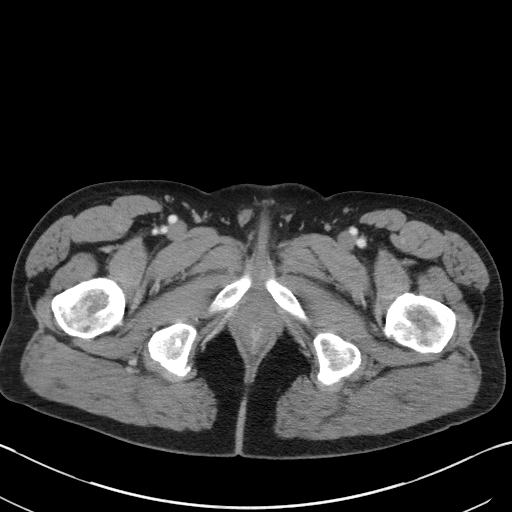
[im 17/91  soft-tissue]
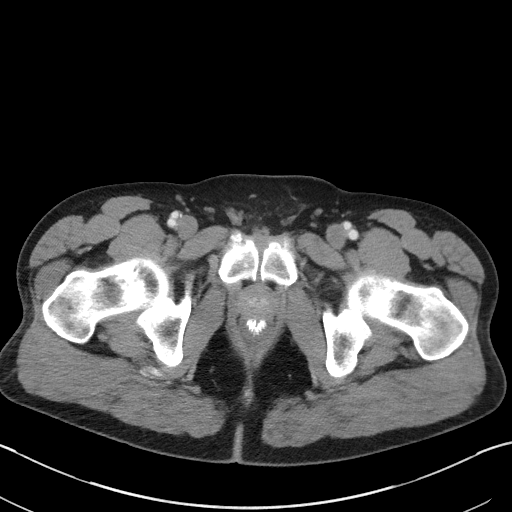
[im 29/91  soft-tissue]
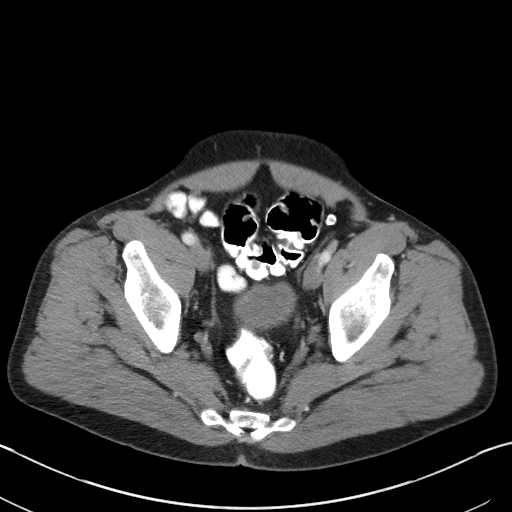
[im 34/91  soft-tissue]
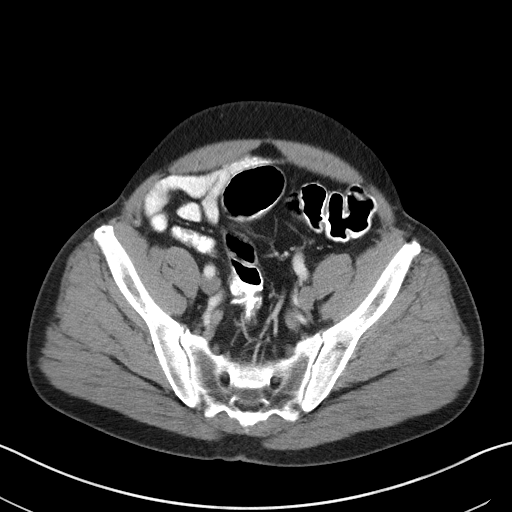
[im 40/91  soft-tissue]
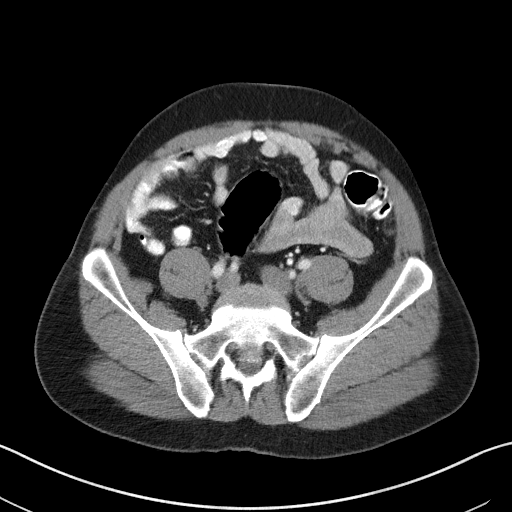
[im 46/91  soft-tissue]
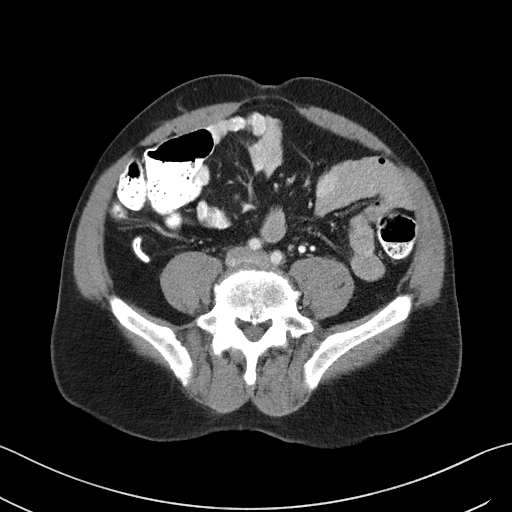
[im 51/91  soft-tissue]
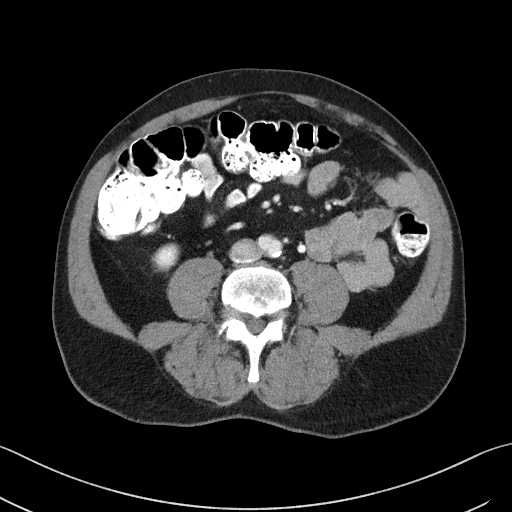
[im 57/91  soft-tissue]
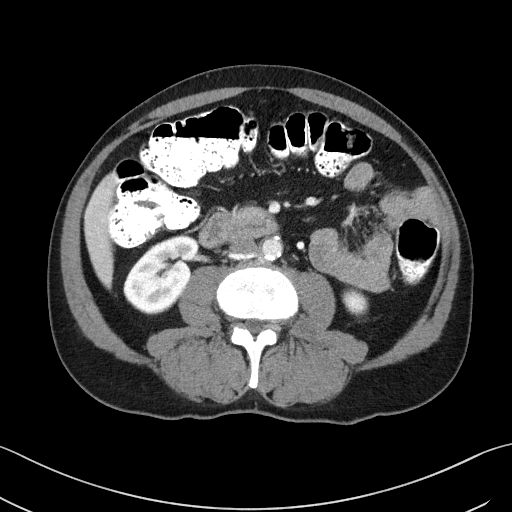
[im 57/91  bone]
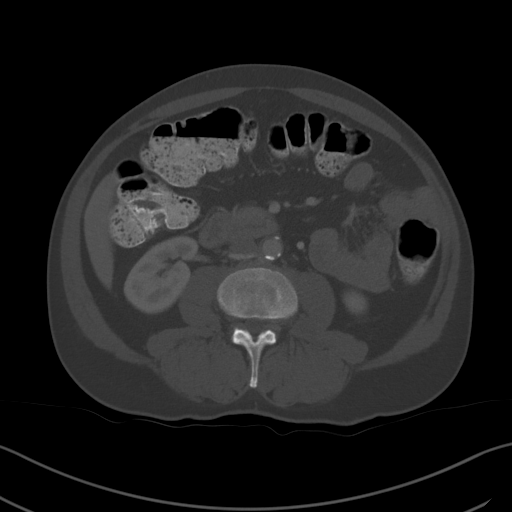
[im 62/91  soft-tissue]
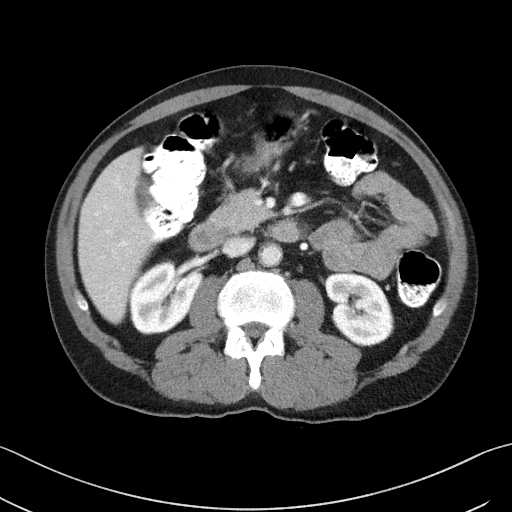
[im 74/91  soft-tissue]
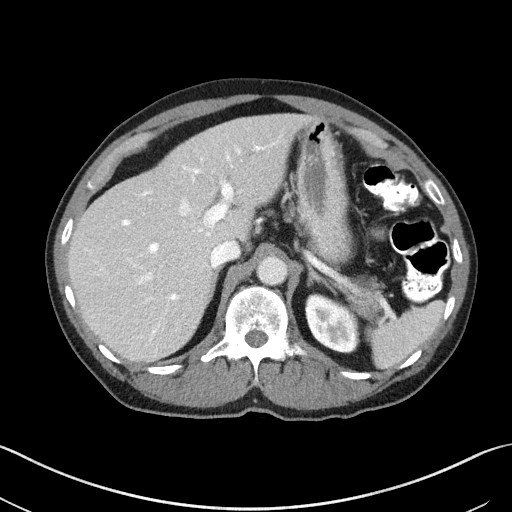
[im 79/91  soft-tissue]
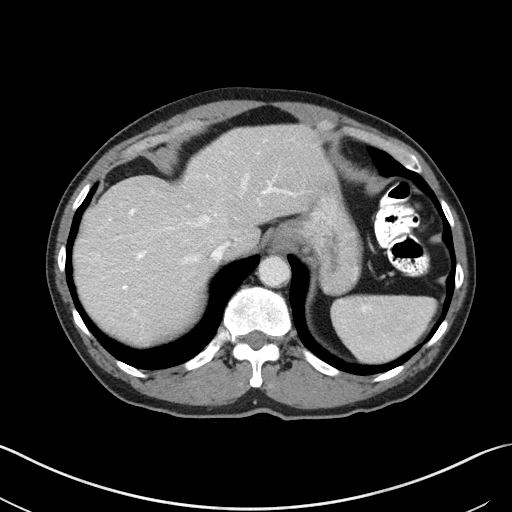
[im 85/91  soft-tissue]
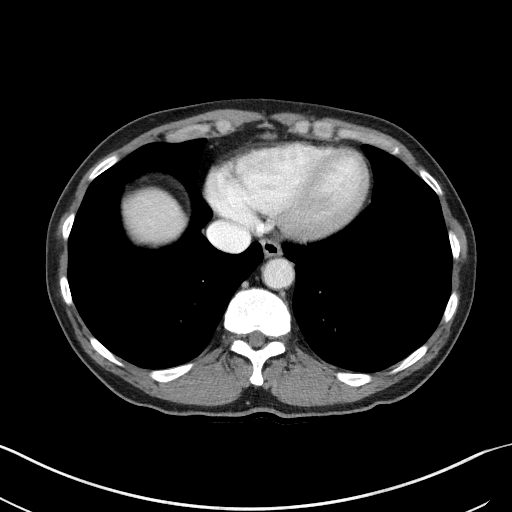

[Series 7: coronal st · coronal · 0.69mm/px · 3 of 117 slices shown]
[im 39/117  soft-tissue]
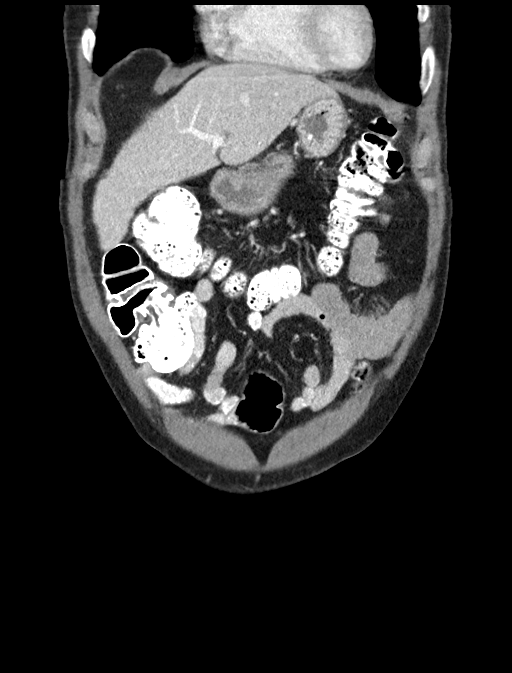
[im 52/117  soft-tissue]
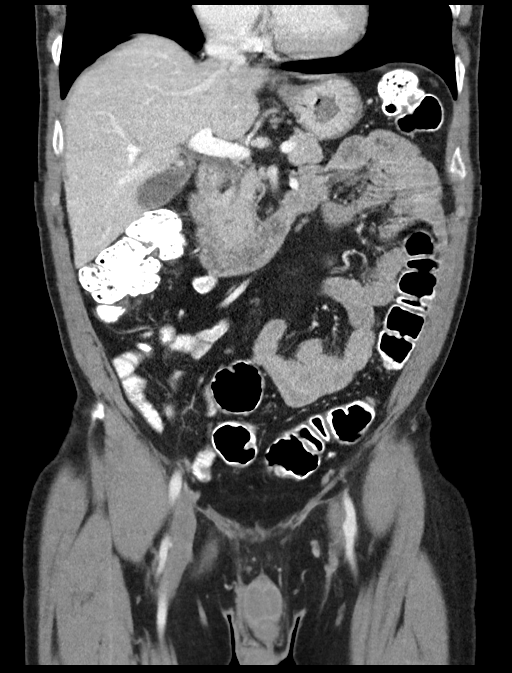
[im 65/117  soft-tissue]
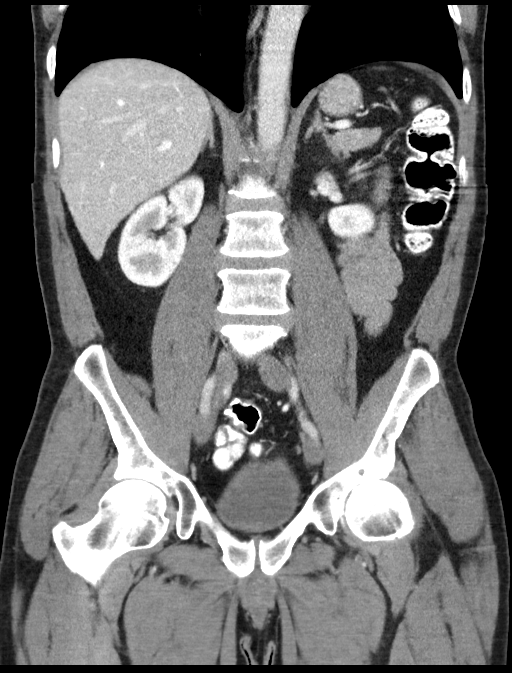

[16 of 46 positions shown; findings below may reference images not displayed]

FINDINGS: Lower chest: The visualized lung bases are clear.

No intra-abdominal free air or free fluid.

Hepatobiliary: No focal liver abnormality is seen. No gallstones,
gallbladder wall thickening, or biliary dilatation.

Pancreas: Unremarkable. No pancreatic ductal dilatation or
surrounding inflammatory changes.

Spleen: Normal in size without focal abnormality.

Adrenals/Urinary Tract: The adrenal glands unremarkable. There is no
hydronephrosis on either side. There is symmetric enhancement and
excretion of contrast by both kidneys. The visualized ureters and
urinary bladder appear unremarkable.

Stomach/Bowel: There is no bowel obstruction or active inflammation.
The appendix is normal.

Vascular/Lymphatic: Moderate calcified and noncalcified
atherosclerotic plaque. The IVC is unremarkable. No portal venous
gas. There is no adenopathy.

Reproductive: The prostate and seminal vesicles are grossly
unremarkable. No pelvic mass.

Other: Small fat containing umbilical hernia.

Musculoskeletal: Degenerative changes of the left hip. No acute
osseous pathology.
IMPRESSION: 1. No acute intra-abdominal or pelvic pathology. No bowel
obstruction. Normal appendix.
2. No inguinal hernia identified.

## 2020-02-11 MED ORDER — IOHEXOL 300 MG/ML  SOLN
100.0000 mL | Freq: Once | INTRAMUSCULAR | Status: AC | PRN
  Administered 2020-02-11: 100 mL via INTRAVENOUS

## 2020-02-12 ENCOUNTER — Ambulatory Visit
Admission: RE | Admit: 2020-02-12 | Discharge: 2020-02-12 | Disposition: A | Discharge status: Home / Self Care | Payer: BC Managed Care – PPO | Source: Ambulatory Visit | Attending: Student | Admitting: Student

## 2020-02-12 DIAGNOSIS — M542 Cervicalgia: Secondary | ICD-10-CM

## 2020-02-12 DIAGNOSIS — R200 Anesthesia of skin: Secondary | ICD-10-CM | POA: Diagnosis not present

## 2020-02-12 DIAGNOSIS — M5021 Other cervical disc displacement,  high cervical region: Secondary | ICD-10-CM | POA: Diagnosis not present

## 2020-02-12 DIAGNOSIS — M50223 Other cervical disc displacement at C6-C7 level: Secondary | ICD-10-CM | POA: Diagnosis not present

## 2020-02-12 IMAGING — CT CT CERVICAL SPINE W/O CM
3 of 5 series · 10 of 35 positions shown, 12 images · non-contrast
Comparison: Radiography [DATE].  MRI [DATE].

CLINICAL DATA: Neck pain with left arm pain and numbness of the
hand.

EXAM:
CT CERVICAL SPINE WITHOUT CONTRAST
TECHNIQUE: Multidetector CT imaging of the cervical spine was performed without
intravenous contrast. Multiplanar CT image reconstructions were also
generated.

[Series 5: c-spine 2.00 br60 s3 sag bone · sagittal · 0.23mm/px · 5 of 70 slices shown, 6 images]
[im 24/70  bone]
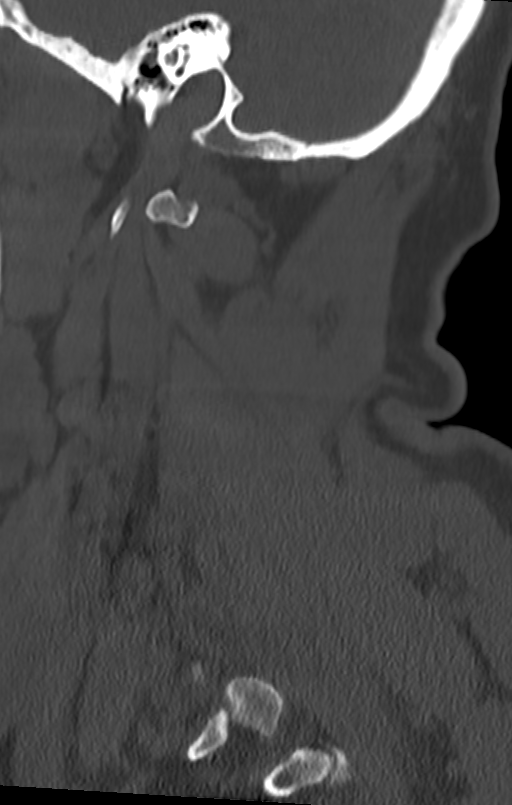
[im 29/70  bone]
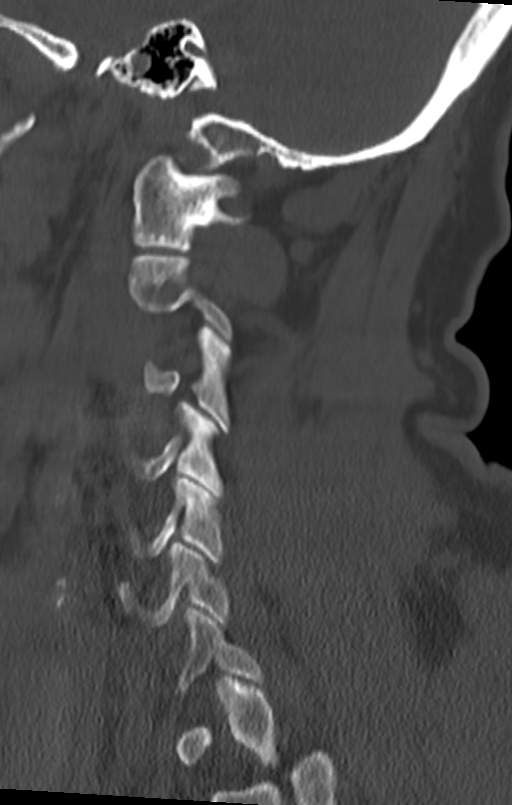
[im 35/70  soft-tissue]
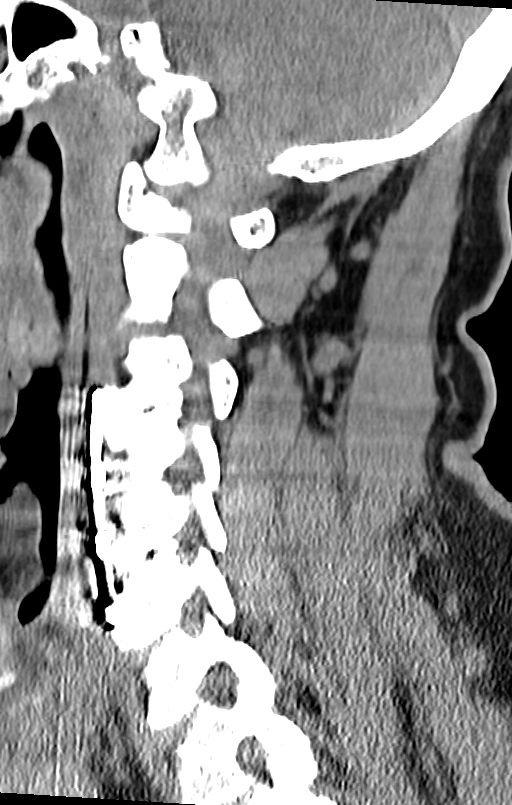
[im 35/70  bone]
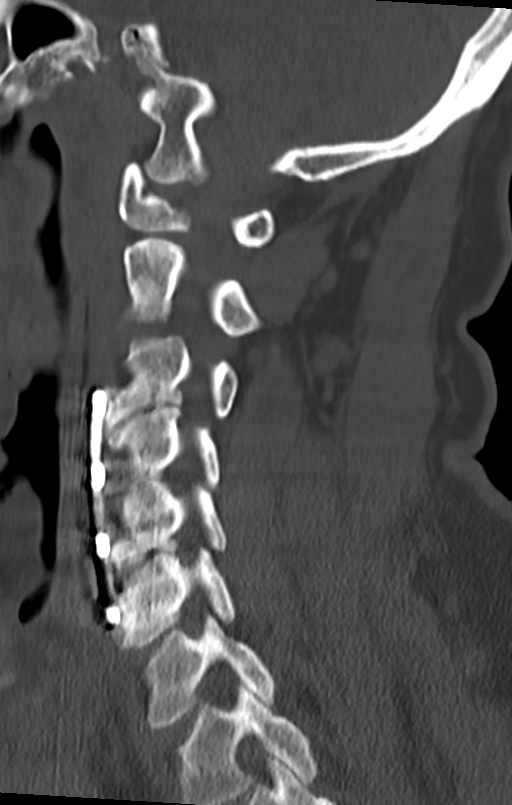
[im 41/70  bone]
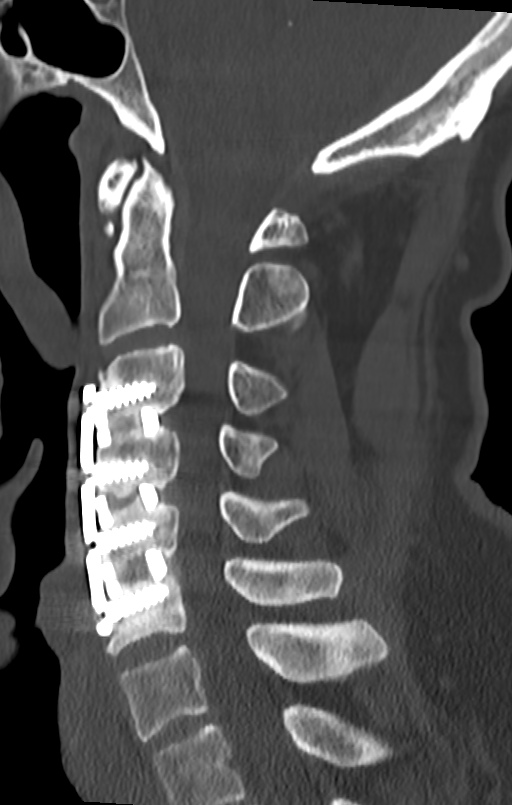
[im 47/70  bone]
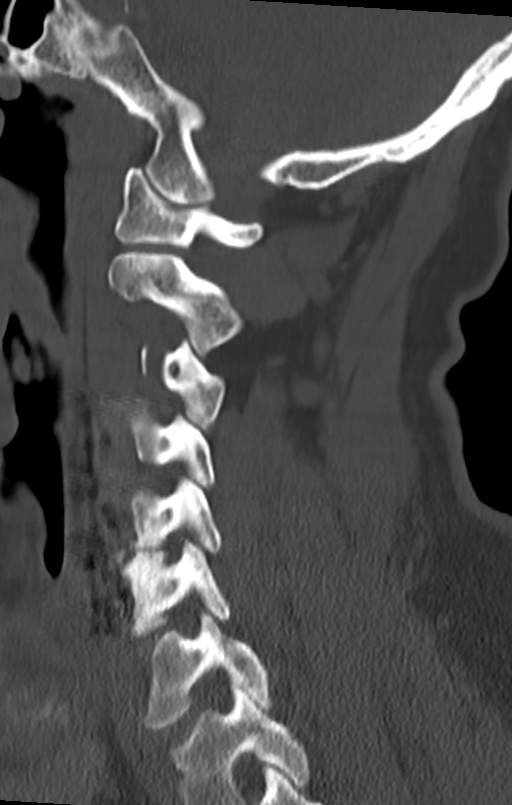

[Series 7: c-spine 2.00 hr60 s3 cor bone · coronal · 0.28mm/px · 3 of 58 slices shown]
[im 12/58  bone]
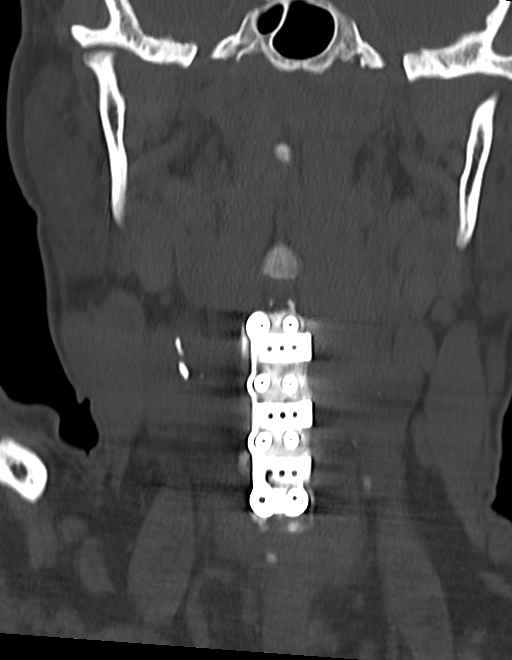
[im 23/58  bone]
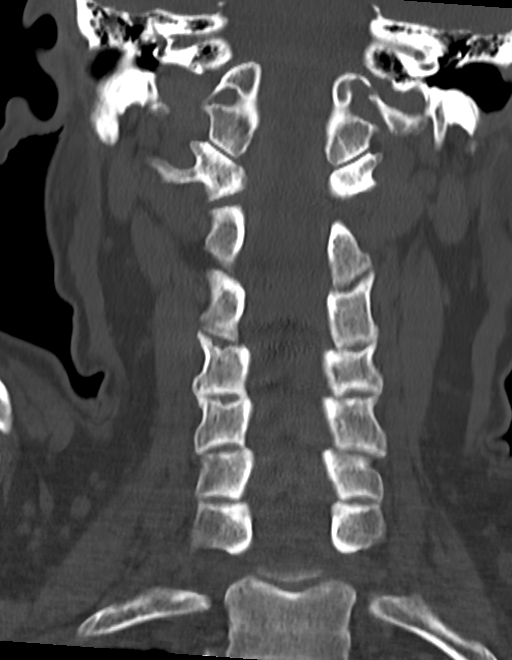
[im 35/58  bone]
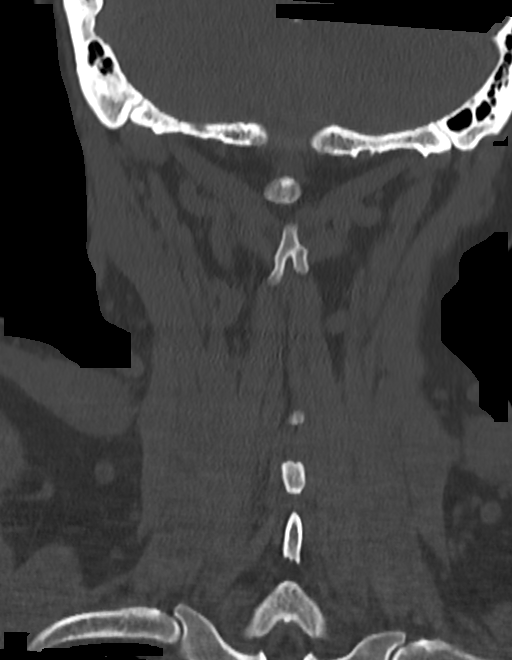

[Series 10: c-spine 2.00 hr60 s3 orthogonal axial · axial · 0.23mm/px · z∈[-770,-731]mm · 2 of 74 slices shown, 3 images]
[im 25/74  soft-tissue]
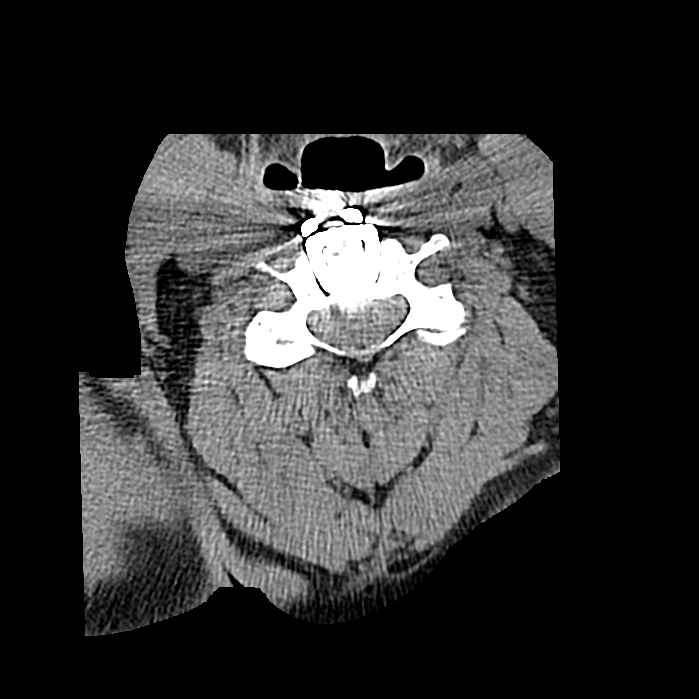
[im 25/74  bone]
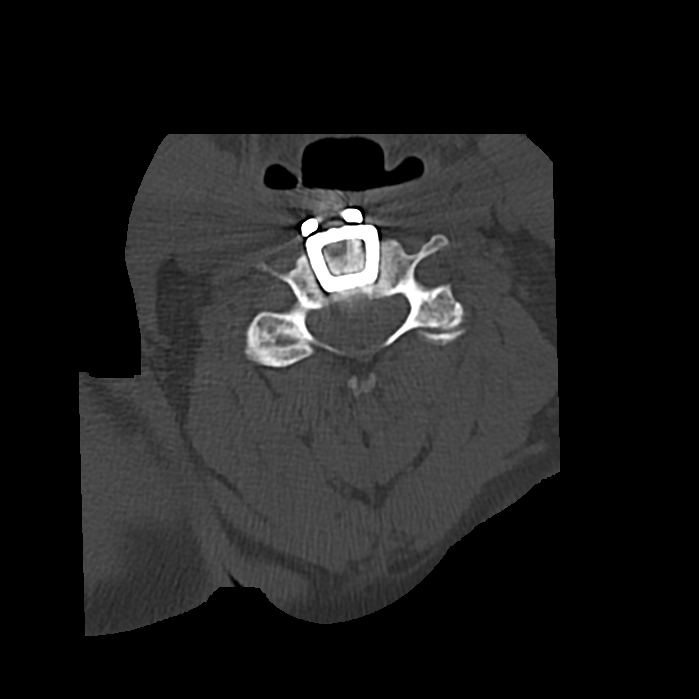
[im 49/74  bone]
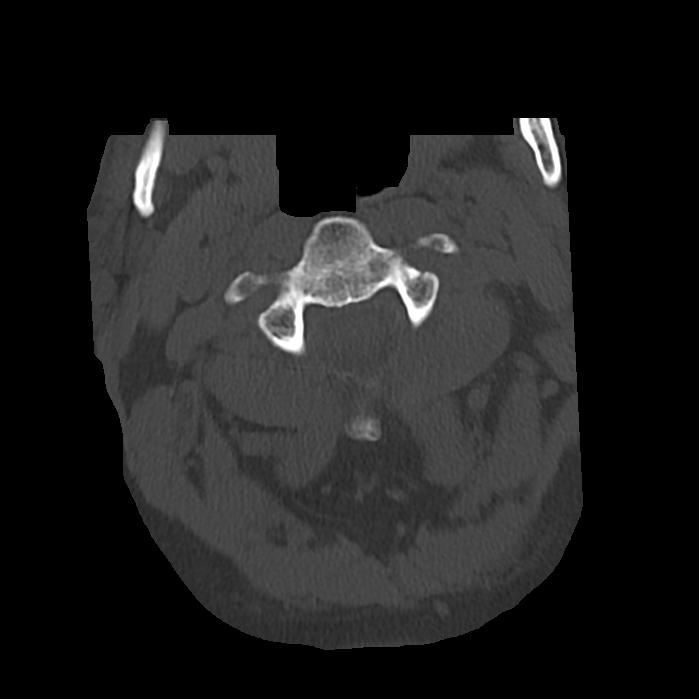

[10 of 35 positions shown; findings below may reference images not displayed]

FINDINGS: Alignment: Normal

Skull base and vertebrae: Previous ACDF C3 through C6. Interbody
spacers appear well position without significant subsidence.
Anterior plate and screws. No sign of screw loosening.

Soft tissues and spinal canal: Negative

Disc levels: Foramen magnum is widely patent. Ordinary mild
osteoarthritis at the C1-2 articulation but without encroachment
upon the neural structures.

C2-3: Minimal disc bulge.  No facet arthropathy.  No stenosis.

C3 through C6: Good appearance following previous ACDF. Sufficient
patency of the canal and foramina.

C6-7: Minimal disc bulge.  No stenosis.

C7-T1: Normal interspace.

Upper chest: Negative

Other: None
IMPRESSION: 1. Good appearance following ACDF C3 through C6. Sufficient patency
of the canal and foramina.
2. Minimal disc bulges at C2-3, and C6-7. No stenosis or neural
compression.
3. Ordinary mild osteoarthritis at the C1-2 articulation but without
encroachment upon the neural structures.

## 2020-02-17 DIAGNOSIS — M542 Cervicalgia: Secondary | ICD-10-CM | POA: Diagnosis not present

## 2020-02-23 DIAGNOSIS — M542 Cervicalgia: Secondary | ICD-10-CM | POA: Diagnosis not present

## 2020-02-23 DIAGNOSIS — M47896 Other spondylosis, lumbar region: Secondary | ICD-10-CM | POA: Diagnosis not present

## 2020-03-01 ENCOUNTER — Other Ambulatory Visit: Payer: Self-pay | Admitting: Surgery

## 2020-03-01 DIAGNOSIS — K429 Umbilical hernia without obstruction or gangrene: Secondary | ICD-10-CM | POA: Diagnosis not present

## 2020-03-02 DIAGNOSIS — J322 Chronic ethmoidal sinusitis: Secondary | ICD-10-CM | POA: Diagnosis not present

## 2020-03-02 DIAGNOSIS — J342 Deviated nasal septum: Secondary | ICD-10-CM | POA: Diagnosis not present

## 2020-03-02 DIAGNOSIS — J310 Chronic rhinitis: Secondary | ICD-10-CM | POA: Diagnosis not present

## 2020-03-02 DIAGNOSIS — J343 Hypertrophy of nasal turbinates: Secondary | ICD-10-CM | POA: Diagnosis not present

## 2020-04-10 ENCOUNTER — Ambulatory Visit
Admission: EM | Admit: 2020-04-10 | Discharge: 2020-04-10 | Disposition: A | Discharge status: Home / Self Care | Payer: BC Managed Care – PPO | Attending: Emergency Medicine | Admitting: Emergency Medicine

## 2020-04-10 ENCOUNTER — Other Ambulatory Visit: Payer: Self-pay

## 2020-04-10 ENCOUNTER — Encounter: Payer: Self-pay | Admitting: Emergency Medicine

## 2020-04-10 DIAGNOSIS — Z1152 Encounter for screening for COVID-19: Secondary | ICD-10-CM

## 2020-04-10 DIAGNOSIS — R5383 Other fatigue: Secondary | ICD-10-CM | POA: Diagnosis not present

## 2020-04-10 DIAGNOSIS — J029 Acute pharyngitis, unspecified: Secondary | ICD-10-CM

## 2020-04-10 DIAGNOSIS — R509 Fever, unspecified: Secondary | ICD-10-CM

## 2020-04-10 DIAGNOSIS — R52 Pain, unspecified: Secondary | ICD-10-CM

## 2020-04-10 MED ORDER — PREDNISONE 10 MG PO TABS: 20.0000 mg | ORAL_TABLET | Freq: Every day | ORAL | 0 refills | Status: DC

## 2020-04-10 MED ORDER — ACETAMINOPHEN 325 MG PO TABS
650.0000 mg | ORAL_TABLET | Freq: Once | ORAL | Status: AC
  Administered 2020-04-10: 650 mg via ORAL

## 2020-04-10 MED ORDER — FLUTICASONE PROPIONATE 50 MCG/ACT NA SUSP: 1.0000 | Freq: Every day | NASAL | 0 refills | Status: DC

## 2020-04-10 MED ORDER — BENZONATATE 100 MG PO CAPS: 100.0000 mg | ORAL_CAPSULE | Freq: Three times a day (TID) | ORAL | 0 refills | Status: DC | PRN

## 2020-04-10 MED ORDER — CETIRIZINE HCL 10 MG PO TABS: 10.0000 mg | ORAL_TABLET | Freq: Every day | ORAL | 0 refills | Status: DC

## 2020-04-10 NOTE — ED Provider Notes (Signed)
[place]   [id number] 04/10/20 Arrival Time: 0865   CC: COVID symptoms  SUBJECTIVE: History from: patient.  Daniel Burton is a 60 y.o. male who presents the urgent care for complaint of chills, fever, fatigue, sore throat and body ache that started this morning.  Denies sick exposure to COVID, flu or strep.  Denies recent travel.  Has tried OTC medication without relief.  Denies alleviating or aggravating factors.  Denies previous symptoms in the past.   Denies fatigue, sinus pain, rhinorrhea, sore throat, SOB, wheezing, chest pain, nausea, changes in bowel or bladder habits.     ROS: As per HPI.  All other pertinent ROS negative.      Past Medical History:  Diagnosis Date  . Back pain with history of spinal surgery    Past Surgical History:  Procedure Laterality Date  . CERVICAL SPINE SURGERY    . ELBOW SURGERY Right   . HERNIA REPAIR     No Known Allergies No current facility-administered medications on file prior to encounter.   Current Outpatient Medications on File Prior to Encounter  Medication Sig Dispense Refill  . atorvastatin (LIPITOR) 20 MG tablet Take 1 tablet (20 mg total) by mouth daily. 90 tablet 1   Social History   Socioeconomic History  . Marital status: Married    Spouse name: Not on file  . Number of children: Not on file  . Years of education: Not on file  . Highest education level: Not on file  Occupational History  . Not on file  Tobacco Use  . Smoking status: Former Smoker    Types: Cigarettes  . Smokeless tobacco: Former Network engineer  . Vaping Use: Never used  Substance and Sexual Activity  . Alcohol use: No  . Drug use: Not on file  . Sexual activity: Not on file  Other Topics Concern  . Not on file  Social History Narrative  . Not on file   Social Determinants of Health   Financial Resource Strain: Not on file  Food Insecurity: Not on file  Transportation Needs: Not on file  Physical Activity: Not on file   Stress: Not on file  Social Connections: Not on file  Intimate Partner Violence: Not on file   Family History  Problem Relation Age of Onset  . Healthy Mother   . Healthy Father   . Allergic rhinitis Neg Hx   . Angioedema Neg Hx   . Asthma Neg Hx   . Urticaria Neg Hx     OBJECTIVE:  Vitals:   04/10/20 0908 04/10/20 0909  BP:  117/74  Pulse:  (!) 102  Resp:  18  Temp:  (!) 102.9 F (39.4 C)  TempSrc:  Oral  SpO2:  96%  Weight: 170 lb (77.1 kg)   Height: 6' (1.829 m)      General appearance: alert; appears fatigued, but nontoxic; speaking in full sentences and tolerating own secretions HEENT: NCAT; Ears: EACs clear, TMs pearly gray; Eyes: PERRL.  EOM grossly intact. Sinuses: nontender; Nose: nares patent without rhinorrhea, Throat: oropharynx clear, tonsils non erythematous or enlarged, uvula midline  Neck: supple without LAD Lungs: unlabored respirations, symmetrical air entry; cough: absent; no respiratory distress; CTAB Heart: regular rate and rhythm.  Radial pulses 2+ symmetrical bilaterally Skin: warm and dry Psychological: alert and cooperative; normal mood and affect  LABS:  No results found for this or any previous visit (from the past 24 hour(s)).   ASSESSMENT & PLAN:  1. Encounter for screening for COVID-19   2. Chills with fever   3. Sore throat   4. Other fatigue   5. Body aches     Meds ordered this encounter  Medications  . acetaminophen (TYLENOL) tablet 650 mg  . fluticasone (FLONASE) 50 MCG/ACT nasal spray    Sig: Place 1 spray into both nostrils daily for 14 days.    Dispense:  16 g    Refill:  0  . cetirizine (ZYRTEC ALLERGY) 10 MG tablet    Sig: Take 1 tablet (10 mg total) by mouth daily.    Dispense:  30 tablet    Refill:  0  . predniSONE (DELTASONE) 10 MG tablet    Sig: Take 2 tablets (20 mg total) by mouth daily.    Dispense:  15 tablet    Refill:  0  . benzonatate (TESSALON) 100 MG capsule    Sig: Take 1 capsule (100 mg total)  by mouth 3 (three) times daily as needed for cough.    Dispense:  30 capsule    Refill:  0    Discharge instructions  COVID testing ordered.  It will take between 2-7 days for test results.  Someone will contact you regarding abnormal results.    Tessalon Perles prescribed for cough Zyrtec for nasal congestion, runny nose, and/or sore throat Flonase for nasal congestion and runny nose  Prednisone was prescribed Use medications daily for symptom relief Use OTC medications like ibuprofen or tylenol as needed fever or pain Call or go to the ED if you have any new or worsening symptoms such as fever, worsening cough, shortness of breath, chest tightness, chest pain, turning blue, changes in mental status, etc...   Reviewed expectations re: course of current medical issues. Questions answered. Outlined signs and symptoms indicating need for more acute intervention. Patient verbalized understanding. After Visit Summary given.         Emerson Monte, [place] 04/10/20 [id number]

## 2020-04-10 NOTE — ED Triage Notes (Signed)
Sore throat, fatigue and body aches that started this morning.

## 2020-04-10 NOTE — Discharge Instructions (Signed)
COVID testing ordered.  It will take between 2-7 days for test results.  Someone will contact you regarding abnormal results.    Tessalon Perles prescribed for cough Zyrtec for nasal congestion, runny nose, and/or sore throat Flonase for nasal congestion and runny nose  Prednisone was prescribed Use medications daily for symptom relief Use OTC medications like ibuprofen or tylenol as needed fever or pain Call or go to the ED if you have any new or worsening symptoms such as fever, worsening cough, shortness of breath, chest tightness, chest pain, turning blue, changes in mental status, etc..Marland Kitchen

## 2020-04-11 ENCOUNTER — Ambulatory Visit: Payer: Self-pay

## 2020-04-14 LAB — COVID-19, FLU A+B NAA
Influenza A, NAA: NOT DETECTED
Influenza B, NAA: NOT DETECTED
SARS-CoV-2, NAA: DETECTED — AB

## 2020-05-10 ENCOUNTER — Telehealth: Payer: Self-pay

## 2020-05-10 NOTE — Telephone Encounter (Signed)
I am willing to see Daniel Burton as a patient. (It is my understanding that he wants a guy Dr.) Bo Mcclintock Please let him know that I am willing to see him but this also has to be approved by the office manager Please connect with Larene Beach (she may have to connect with Dr. Lovena Le also )if she is okay with this I am as well

## 2020-05-10 NOTE — Telephone Encounter (Signed)
Daniel Burton said Dr Nicki Reaper was going take him on as a new patient I have told him that he was not taking new patients. He said he did not want to see a woman and that Dr Nicki Reaper used to play ball with him and took his name and number. He wanted to send a message through my chart Dr Lovena Le is his Dr and unable to send. He wants message sent to Dr Nicki Reaper took my name as well   Pt call back [phone]

## 2020-05-11 NOTE — Telephone Encounter (Signed)
Yes, this is fine with me.  But he was upset with last visit that I told him he needed to start a statin due to his elevated cholesterol.  Thx, Shelly

## 2020-05-11 NOTE — Telephone Encounter (Signed)
Front-this is fine with me and fine with Dr. Lovena Le please proceed to help the patient thank you (Please change primary care of record)

## 2020-06-08 ENCOUNTER — Other Ambulatory Visit: Payer: Self-pay | Admitting: Neurology

## 2020-06-08 ENCOUNTER — Other Ambulatory Visit: Payer: Self-pay

## 2020-06-08 ENCOUNTER — Other Ambulatory Visit (HOSPITAL_COMMUNITY): Payer: Self-pay | Admitting: Interventional Radiology

## 2020-06-08 ENCOUNTER — Ambulatory Visit
Admission: RE | Admit: 2020-06-08 | Discharge: 2020-06-08 | Disposition: A | Discharge status: Home / Self Care | Payer: BC Managed Care – PPO | Source: Ambulatory Visit | Attending: Neurology | Admitting: Neurology

## 2020-06-08 ENCOUNTER — Encounter: Payer: Self-pay | Admitting: Neurology

## 2020-06-08 ENCOUNTER — Telehealth: Payer: Self-pay | Admitting: Neurology

## 2020-06-08 ENCOUNTER — Ambulatory Visit: Payer: BC Managed Care – PPO | Admitting: Neurology

## 2020-06-08 VITALS — BP 117/66 | HR 68 | Ht 72.0 in | Wt 164.0 lb

## 2020-06-08 DIAGNOSIS — H93231 Hyperacusis, right ear: Secondary | ICD-10-CM

## 2020-06-08 DIAGNOSIS — H93A1 Pulsatile tinnitus, right ear: Secondary | ICD-10-CM

## 2020-06-08 DIAGNOSIS — I7771 Dissection of carotid artery: Secondary | ICD-10-CM

## 2020-06-08 DIAGNOSIS — I671 Cerebral aneurysm, nonruptured: Secondary | ICD-10-CM

## 2020-06-08 DIAGNOSIS — I720 Aneurysm of carotid artery: Secondary | ICD-10-CM | POA: Diagnosis not present

## 2020-06-08 DIAGNOSIS — I672 Cerebral atherosclerosis: Secondary | ICD-10-CM | POA: Diagnosis not present

## 2020-06-08 DIAGNOSIS — I729 Aneurysm of unspecified site: Secondary | ICD-10-CM

## 2020-06-08 DIAGNOSIS — I6523 Occlusion and stenosis of bilateral carotid arteries: Secondary | ICD-10-CM | POA: Diagnosis not present

## 2020-06-08 DIAGNOSIS — I6503 Occlusion and stenosis of bilateral vertebral arteries: Secondary | ICD-10-CM | POA: Diagnosis not present

## 2020-06-08 DIAGNOSIS — J3489 Other specified disorders of nose and nasal sinuses: Secondary | ICD-10-CM | POA: Diagnosis not present

## 2020-06-08 IMAGING — CT CT ANGIO NECK
1 of 3 series · 3 of 16 positions shown · IV contrast (iopamidol)
Comparison: Maxillofacial CT [DATE]. CT of the cervical spine
[DATE].

CLINICAL DATA: Carotid aneurysm, right. Pulsatile tinnitus of right
ear. He hearing abnormally acute, right. Dissection of carotid
artery. Tinnitus, pulsatile. Additional history provided by scanning
technologist: Patient reports right carotid aneurysm, pulsatile
tinnitus of right ear, hearing abnormality, throbbing in head,
symptoms for months.

EXAM:
CT ANGIOGRAPHY HEAD AND NECK
TECHNIQUE: Multidetector CT imaging of the head and neck was performed using
the standard protocol during bolus administration of intravenous
contrast. Multiplanar CT image reconstructions and MIPs were
obtained to evaluate the vascular anatomy. Carotid stenosis
measurements (when applicable) are obtained utilizing NASCET
criteria, using the distal internal carotid diameter as the
denominator.
CONTRAST:  75mL [P6] IOPAMIDOL ([P6]) INJECTION 76%

[Series 7: head/neck angio · axial · 0.49mm/px · z∈[-318,-118]mm · 3 of 201 slices shown]
[im 51/201  soft-tissue]
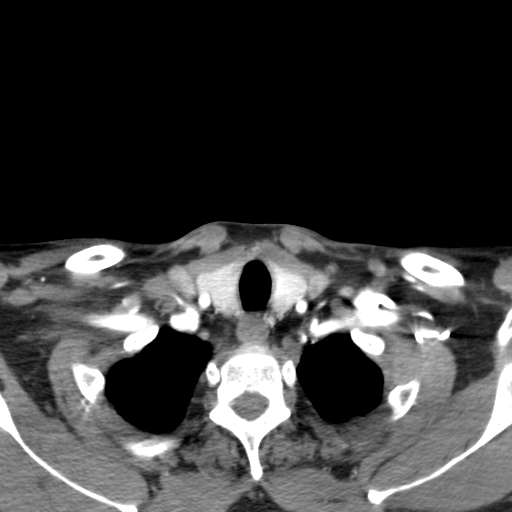
[im 101/201  bone]
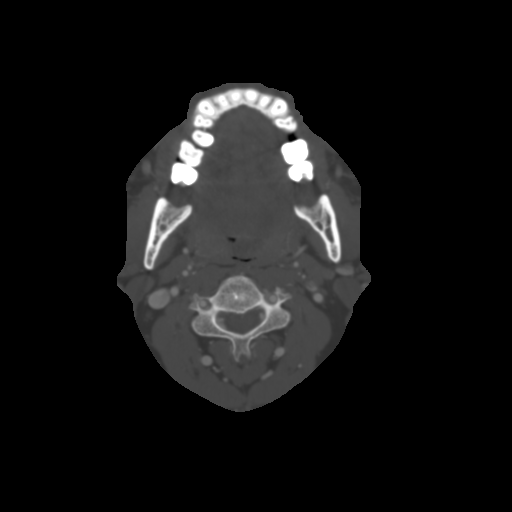
[im 151/201  soft-tissue]
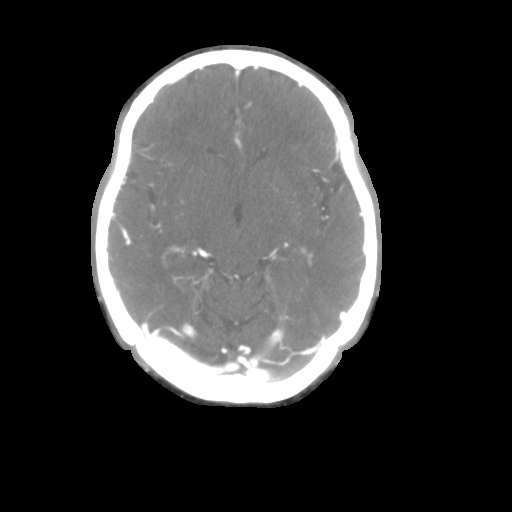

[3 of 16 positions shown; findings below may reference images not displayed]

FINDINGS: CT HEAD FINDINGS

Brain:

Cerebral volume is normal.

Moderate patchy and ill-defined hypoattenuation within the cerebral
white matter is nonspecific, but most often secondary to chronic
small vessel ischemia.

There is no acute intracranial hemorrhage.

No demarcated cortical infarct.

No extra-axial fluid collection.

No evidence of intracranial mass.

No midline shift.

Vascular: No hyperdense vessel.  Atherosclerotic calcifications

Skull: Normal. Negative for fracture or focal lesion.

Sinuses: Mild bilateral ethmoid sinus mucosal thickening.

Orbits: No mass or acute finding.

Review of the MIP images confirms the above findings

CTA NECK FINDINGS

Aortic arch: Common origin of the innominate and left common carotid
arteries. Atherosclerotic plaque within the proximal major branch
vessels of the neck. No hemodynamically significant innominate or
proximal subclavian artery stenosis.

Right carotid system: CCA and ICA patent within the neck. Soft and
calcified plaque within the proximal ICA resulting in less than 50%
stenosis.

Left carotid system: CCA and ICA patent within the neck without
stenosis. Minimal soft and calcified plaque within the carotid
bifurcation and proximal ICA.

Vertebral arteries: Codominant and patent within the neck. Mild
atherosclerotic narrowing at the origin of the left vertebral
artery.

Skeleton: Redemonstrated sequela of prior C2-C6 ACDF. No acute bony
abnormality or aggressive osseous lesion.

Other neck: No neck mass or cervical lymphadenopathy. Subcentimeter
thyroid nodules not meeting consensus criteria for ultrasound
follow-up.

Upper chest: 8 mm ground-glass nodule within the posterior aspect of
the right upper lobe.

Review of the MIP images confirms the above findings

CTA HEAD FINDINGS

Anterior circulation:

Suboptimal intracranial contrast timing. There is a normal course of
the intracranial internal carotid arteries. Scattered soft and
calcified plaque within both vessels with no more than mild
stenosis. The intracranial internal carotid arteries are patent. The
M1 middle cerebral arteries are patent. No M2 proximal branch
occlusion or high-grade proximal stenosis is identified. The
anterior cerebral arteries are patent. No intracranial aneurysm is
identified.

1-2 mm aneurysm arising from the distal cavernous right ICA versus
small focus of calcified plaque (series 13, image 114) (series 15,
image 279). 1-2 mm posteriorly projecting vascular protrusion
arising from the supraclinoid right ICA which may reflect an
aneurysm or infundibulum (series 13, image 116).

Posterior circulation:

The intracranial vertebral arteries are patent. The basilar artery
is patent. The posterior cerebral arteries are patent. Posterior
communicating arteries are hypoplastic or absent bilaterally.

Venous sinuses: Within the limitations of contrast timing, no
convincing thrombus. The right jugular bulb is prominent and
slightly higher in position as compared to the left.

Anatomic variants: As described

Review of the MIP images confirms the above findings
IMPRESSION: CT head:

1. No evidence of acute intracranial abnormality.
2. Moderate cerebral white matter disease, nonspecific but most
often secondary to chronic small vessel ischemia.
3. Mild bilateral ethmoid sinus mucosal thickening.

CTA neck:

1. The bilateral common carotid and internal carotid arteries are
patent within the neck without hemodynamically significant stenosis
(50% or greater). Atherosclerotic plaque within the proximal right
ICA, left carotid bifurcation and proximal left ICA as described. No
evidence of carotid artery dissection or aneurysm.
2. Vertebral arteries patent within the neck. Mild atherosclerotic
narrowing at the origin of the left vertebral artery.
3. 8 mm ground-glass right upper lobe pulmonary nodule. Initial
follow-up with CT at 6-12 months is recommended to confirm
persistence. If persistent, repeat CT is recommended every 2 years
until 5 years of stability has been established. This recommendation
follows the consensus statement: Guidelines for Management of
Incidental Pulmonary Nodules Detected on CT Images: From the

CTA head:

1. Suboptimal intracranial arterial contrast timing.
2. No intracranial large vessel occlusion or proximal high-grade
arterial stenosis.
3. 1-2 mm aneurysm arising from the distal cavernous right ICA
versus small focus of calcified plaque within the vessel wall.
4. 1-2 mm aneurysm versus infundibulum arising from the supraclinoid
right ICA.
5. The right jugular bulb is prominent and slightly higher in
position as compared to the left.

## 2020-06-08 IMAGING — CT CT ANGIO HEAD
1 of 4 series · 6 of 30 positions shown · IV contrast (iopamidol)
Comparison: Maxillofacial CT [DATE]. CT of the cervical spine
[DATE].

CLINICAL DATA: Carotid aneurysm, right. Pulsatile tinnitus of right
ear. He hearing abnormally acute, right. Dissection of carotid
artery. Tinnitus, pulsatile. Additional history provided by scanning
technologist: Patient reports right carotid aneurysm, pulsatile
tinnitus of right ear, hearing abnormality, throbbing in head,
symptoms for months.

EXAM:
CT ANGIOGRAPHY HEAD AND NECK
TECHNIQUE: Multidetector CT imaging of the head and neck was performed using
the standard protocol during bolus administration of intravenous
contrast. Multiplanar CT image reconstructions and MIPs were
obtained to evaluate the vascular anatomy. Carotid stenosis
measurements (when applicable) are obtained utilizing NASCET
criteria, using the distal internal carotid diameter as the
denominator.
CONTRAST:  75mL [P6] IOPAMIDOL ([P6]) INJECTION 76%

[Series 7: head/neck angio · axial · 0.49mm/px · z∈[-362,-76]mm · 6 of 201 slices shown]
[im 29/201  brain]
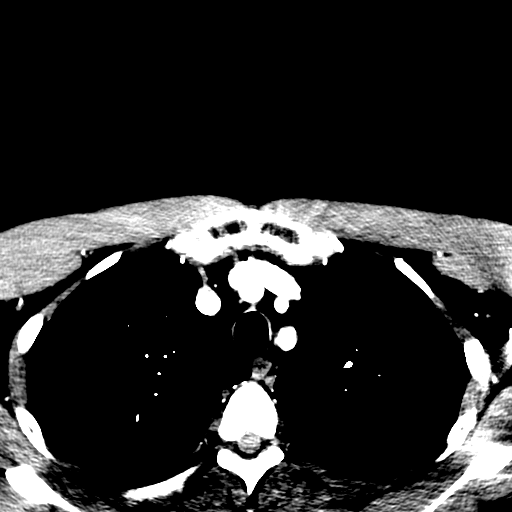
[im 58/201  bone]
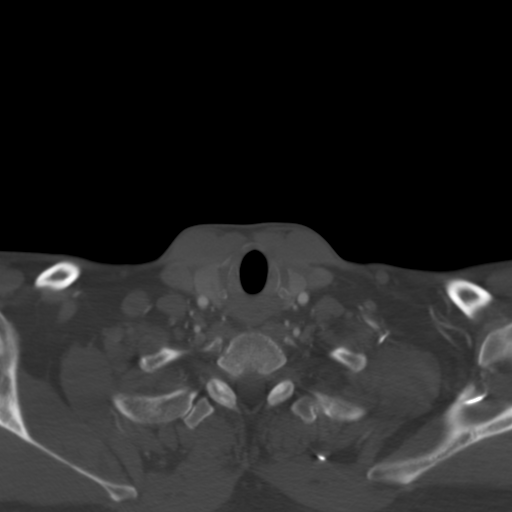
[im 86/201  brain]
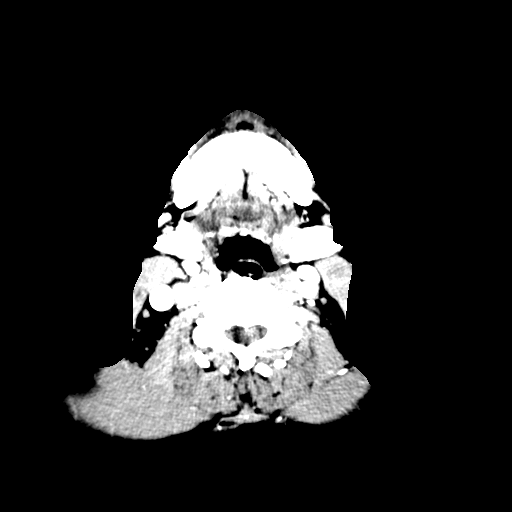
[im 115/201  bone]
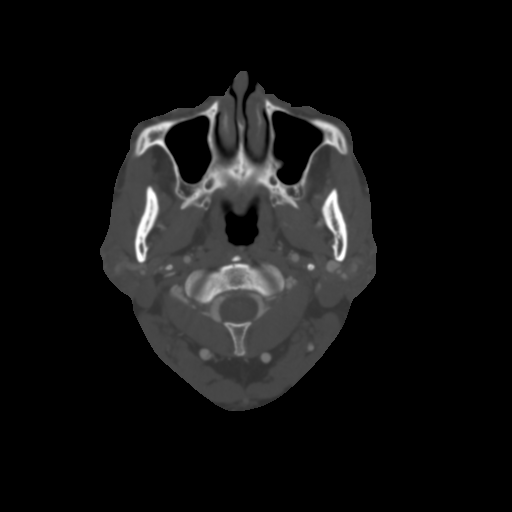
[im 143/201  brain]
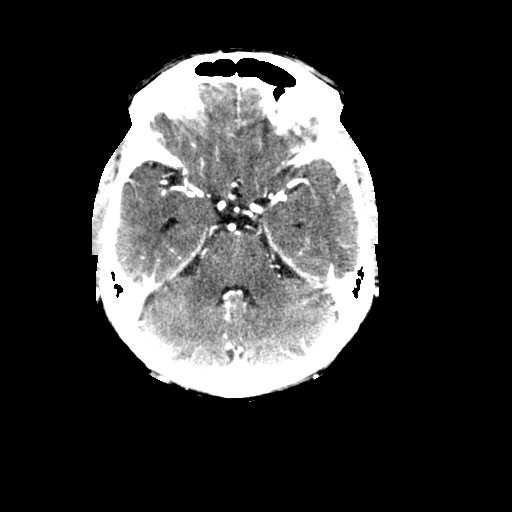
[im 172/201  bone]
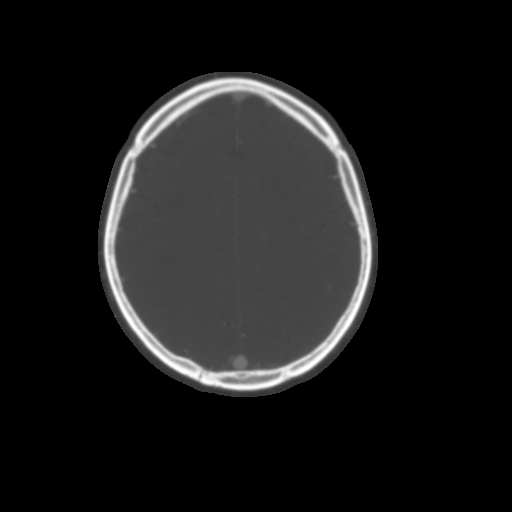

[6 of 30 positions shown; findings below may reference images not displayed]

FINDINGS: CT HEAD FINDINGS

Brain:

Cerebral volume is normal.

Moderate patchy and ill-defined hypoattenuation within the cerebral
white matter is nonspecific, but most often secondary to chronic
small vessel ischemia.

There is no acute intracranial hemorrhage.

No demarcated cortical infarct.

No extra-axial fluid collection.

No evidence of intracranial mass.

No midline shift.

Vascular: No hyperdense vessel.  Atherosclerotic calcifications

Skull: Normal. Negative for fracture or focal lesion.

Sinuses: Mild bilateral ethmoid sinus mucosal thickening.

Orbits: No mass or acute finding.

Review of the MIP images confirms the above findings

CTA NECK FINDINGS

Aortic arch: Common origin of the innominate and left common carotid
arteries. Atherosclerotic plaque within the proximal major branch
vessels of the neck. No hemodynamically significant innominate or
proximal subclavian artery stenosis.

Right carotid system: CCA and ICA patent within the neck. Soft and
calcified plaque within the proximal ICA resulting in less than 50%
stenosis.

Left carotid system: CCA and ICA patent within the neck without
stenosis. Minimal soft and calcified plaque within the carotid
bifurcation and proximal ICA.

Vertebral arteries: Codominant and patent within the neck. Mild
atherosclerotic narrowing at the origin of the left vertebral
artery.

Skeleton: Redemonstrated sequela of prior C2-C6 ACDF. No acute bony
abnormality or aggressive osseous lesion.

Other neck: No neck mass or cervical lymphadenopathy. Subcentimeter
thyroid nodules not meeting consensus criteria for ultrasound
follow-up.

Upper chest: 8 mm ground-glass nodule within the posterior aspect of
the right upper lobe.

Review of the MIP images confirms the above findings

CTA HEAD FINDINGS

Anterior circulation:

Suboptimal intracranial contrast timing. There is a normal course of
the intracranial internal carotid arteries. Scattered soft and
calcified plaque within both vessels with no more than mild
stenosis. The intracranial internal carotid arteries are patent. The
M1 middle cerebral arteries are patent. No M2 proximal branch
occlusion or high-grade proximal stenosis is identified. The
anterior cerebral arteries are patent. No intracranial aneurysm is
identified.

1-2 mm aneurysm arising from the distal cavernous right ICA versus
small focus of calcified plaque (series 13, image 114) (series 15,
image 279). 1-2 mm posteriorly projecting vascular protrusion
arising from the supraclinoid right ICA which may reflect an
aneurysm or infundibulum (series 13, image 116).

Posterior circulation:

The intracranial vertebral arteries are patent. The basilar artery
is patent. The posterior cerebral arteries are patent. Posterior
communicating arteries are hypoplastic or absent bilaterally.

Venous sinuses: Within the limitations of contrast timing, no
convincing thrombus. The right jugular bulb is prominent and
slightly higher in position as compared to the left.

Anatomic variants: As described

Review of the MIP images confirms the above findings
IMPRESSION: CT head:

1. No evidence of acute intracranial abnormality.
2. Moderate cerebral white matter disease, nonspecific but most
often secondary to chronic small vessel ischemia.
3. Mild bilateral ethmoid sinus mucosal thickening.

CTA neck:

1. The bilateral common carotid and internal carotid arteries are
patent within the neck without hemodynamically significant stenosis
(50% or greater). Atherosclerotic plaque within the proximal right
ICA, left carotid bifurcation and proximal left ICA as described. No
evidence of carotid artery dissection or aneurysm.
2. Vertebral arteries patent within the neck. Mild atherosclerotic
narrowing at the origin of the left vertebral artery.
3. 8 mm ground-glass right upper lobe pulmonary nodule. Initial
follow-up with CT at 6-12 months is recommended to confirm
persistence. If persistent, repeat CT is recommended every 2 years
until 5 years of stability has been established. This recommendation
follows the consensus statement: Guidelines for Management of
Incidental Pulmonary Nodules Detected on CT Images: From the

CTA head:

1. Suboptimal intracranial arterial contrast timing.
2. No intracranial large vessel occlusion or proximal high-grade
arterial stenosis.
3. 1-2 mm aneurysm arising from the distal cavernous right ICA
versus small focus of calcified plaque within the vessel wall.
4. 1-2 mm aneurysm versus infundibulum arising from the supraclinoid
right ICA.
5. The right jugular bulb is prominent and slightly higher in
position as compared to the left.

## 2020-06-08 MED ORDER — IOPAMIDOL (ISOVUE-370) INJECTION 76%
75.0000 mL | Freq: Once | INTRAVENOUS | Status: AC | PRN
  Administered 2020-06-08: 75 mL via INTRAVENOUS

## 2020-06-08 MED ORDER — ACETAZOLAMIDE 125 MG PO TABS: 125.0000 mg | ORAL_TABLET | Freq: Two times a day (BID) | ORAL | 6 refills | Status: DC

## 2020-06-08 NOTE — Progress Notes (Addendum)
[facility]    Provider:  Dr Jaynee Eagles Requesting Provider: Leta Baptist MD Primary Care Provider:  Celene Squibb, MD  CC:  Pulsing right ear  HPI:  Daniel Burton is a 60 y.o. male here as requested by Erven Colla, DO for throbbing head pain. Diagnosed at ENT with chronic nasal congestion and rhinitis, nasal mucosal congestion, septal deviation and chronic inferior turbinate hypertrophy but no etiology of the pulsatile tinnitus or sinus infection..  I reviewed Dr.  Benjamine Mola, Su MD's notes: Patient presented as above, the patient was treated with Flonase nasal spray, he subsequently underwent a sinus CT scan which showed no significant acute or chronic sinusitis only a few scattered opacified ethmoid air cells were noted, patient with persistent throbbing sensation in the forehead, flexible endoscopy was performed in the anterior nasal cavity, superior, inferior middle meatus endoscopy was performed, edematous mucosa was noted, no polyp mass or lesion, nasal septum deviation was noted, nasopharynx was clear, the turbinates were hypertrophied but without mass, bilaterally diagnosed with chronic rhinitis with nasal mucosal congestion, nasal septal deviation and bilateral inferior turbinate hypertrophy.  Patient reports throbbing sensation in his head, the patient had an ACDF in December 2020, since that time he has noted a throbbing sensation in his head, he also complains of associated frequent nasal congestion worse at night. He has throbbing every day, at least 22 headache days in a month, he has a throbbing, a pulse in his head, on the right side, he can hear it in his ear pulsing, mostly on the right side, not painful, it lasts 15-30 minutes, happens when he gets up in the morning, both ways can occur on wakeup or right after, better laying down on his right side, worse standing, he can lay on his right side and it doesn't do it and then he starts exercising can make it worse, it is  scary and terrible but no pain. Ibuprofen helps. Worse with exertion. He sometimes feels a hot flash. Started after his acdf, sometimes accompanied by dizziness, lightheadedness, he may feel lightheaded sometimes with standing. Really hard, feels like it is going to jump out of his ear. No problem at work but he wears ear plugs.   CMO unremarkable, reviewed  Reviewed notes, labs and imaging from outside physicians, which showed:   CT Maxillofacial 01/28/2020: FINDINGS: personally reviewed images and agree Osseous: No regional osseous abnormality.  Orbits: Normal  Sinuses: Frontal sinuses are clear. Few scattered opacified ethmoid air cells without advanced disease. Maxillary sinuses are clear. Sphenoid sinus is clear. Both ostiomeatal units are sufficiently patent. Adjacent ethmoid bulla on the left. Nasal passages are patent.  Soft tissues: Otherwise negative  Limited intracranial: Normal  IMPRESSION: Few scattered opacified ethmoid air cells without advanced disease.   Review of Systems: Patient complains of symptoms per HPI as well as the following symptoms: hearing changes. Pertinent negatives and positives per HPI. All others negative.   Social History   Socioeconomic History  . Marital status: Married    Spouse name: Not on file  . Number of children: 2  . Years of education: 26  . Highest education level: Not on file  Occupational History  . Not on file  Tobacco Use  . Smoking status: Former Smoker    Types: Cigarettes  . Smokeless tobacco: Former Network engineer  . Vaping Use: Never used  Substance and Sexual Activity  . Alcohol use: No  . Drug use: Not Currently  . Sexual  activity: Not on file  Other Topics Concern  . Not on file  Social History Narrative   Lives at home with wife   Right handed   Caffeine: tea occasionally    Social Determinants of Health   Financial Resource Strain: Not on file  Food Insecurity: Not on file  Transportation  Needs: Not on file  Physical Activity: Not on file  Stress: Not on file  Social Connections: Not on file  Intimate Partner Violence: Not on file    Family History  Problem Relation Age of Onset  . Diabetes Mother   . Throat cancer Father   . Liver cancer Sister   . Diabetes Brother   . Thyroid disease Sister   . Allergic rhinitis Neg Hx   . Angioedema Neg Hx   . Asthma Neg Hx   . Urticaria Neg Hx     Past Medical History:  Diagnosis Date  . Back pain with history of spinal surgery   . High cholesterol     Patient Active Problem List   Diagnosis Date Noted  . Hyperlipidemia 12/09/2019  . Abnormal liver enzymes 11/25/2019  . Screening for deficiency anemia 11/25/2019  . Chronic back pain 10/29/2012    Past Surgical History:  Procedure Laterality Date  . CERVICAL SPINE SURGERY  2021  . ELBOW SURGERY Right 1979  . HERNIA REPAIR      Current Outpatient Medications  Medication Sig Dispense Refill  . Multiple Vitamins-Minerals (ALIVE MULTI-VITAMIN PO) Take by mouth.    Marland Kitchen OVER THE COUNTER MEDICATION Cholesterol supplement    . atorvastatin (LIPITOR) 20 MG tablet Take 1 tablet (20 mg total) by mouth daily. (Patient not taking: Reported on 06/08/2020) 90 tablet 1  . fluticasone (FLONASE) 50 MCG/ACT nasal spray Place 1 spray into both nostrils daily for 14 days. (Patient not taking: Reported on 06/08/2020) 16 g 0   No current facility-administered medications for this visit.    Allergies as of 06/08/2020  . (No Known Allergies)    Vitals: BP 117/66 (BP Location: Right Arm, Patient Position: Sitting)   Pulse 68   Ht 6' (1.829 m)   Wt 164 lb (74.4 kg)   BMI 22.24 kg/m  Last Weight:  Wt Readings from Last 1 Encounters:  06/08/20 164 lb (74.4 kg)   Last Height:   Ht Readings from Last 1 Encounters:  06/08/20 6' (1.829 m)     Physical exam: Exam: Gen: NAD, conversant, well nourised, well groomed                     CV: RRR, no MRG. No Carotid Bruits. No  peripheral edema, warm, nontender Eyes: Conjunctivae clear without exudates or hemorrhage  Neuro: Detailed Neurologic Exam  Speech:    Speech is normal; fluent and spontaneous with normal comprehension.  Cognition:    The patient is oriented to person, place, and time;     recent and remote memory intact;     language fluent;     normal attention, concentration,     fund of knowledge Cranial Nerves:    The pupils are equal, round, and reactive to light. The fundi are normal and spontaneous venous pulsations are present. Visual fields are full to finger confrontation. Extraocular movements are intact. Trigeminal sensation is intact and the muscles of mastication are normal. The face is symmetric. The palate elevates in the midline. Hearing intact. Voice is normal. Shoulder shrug is normal. The tongue has normal motion without fasciculations.  Coordination:    Normal finger to nose and heel to shin. Normal rapid alternating movements.   Gait:    Heel-toe and tandem gait are normal.   Motor Observation:    No asymmetry, no atrophy, and no involuntary movements noted. Tone:    Normal muscle tone.    Posture:    Posture is normal. normal erect    Strength:    Strength is V/V in the upper and lower limbs.      Sensation: intact to LT     Reflex Exam:  DTR's:    Deep tendon reflexes in the upper and lower extremities are normal bilaterally.   Toes:    The toes are equiv bilaterally.   Clonus:    Clonus is absent.    Assessment/Plan:  Lovely patient with significant pulsatile tinnitus, to the point it affects the hearing in his right ear, daily, positional, needs stat CTA H&N for aneurysm, fistula or other vascular abnormality, vascular malformations, abnormal cerebral pressures, and unique blood flow patterns near the ear. The condition can also be caused by the presence of a tumor.  Approved today, on hold to see if we can get him to imaging this morning  Orders Placed  This Encounter  Procedures  . CT ANGIO NECK W OR WO CONTRAST  . CT ANGIO HEAD W OR WO CONTRAST   No orders of the defined types were placed in this encounter.   Cc:  Celene Squibb, MD,  Leta Baptist MD  Sarina Ill, MD  [facility] [address] [place], [place] [address]  Phone [phone] Fax [phone]

## 2020-06-08 NOTE — Patient Instructions (Addendum)
CT angiogram of the head and neck If negative MRI of the brain and MRV of the venous system  Pulsatile Tinnitus Causes & Treatments Pulsatile Tinnitus (PT) is a symptom that affects nearly five million Americans. The sensation of hearing a rhythmic noise, such as a heartbeat, swooshing or whooshing, from no external source, is, at best, a little unsettling; for many, the near constant sound exceeds annoyance and becomes completely debilitating. Nearly 60% of patients who experience this issue also suffer from some form of depression or anxiety. These rates are significantly higher than those associated with other chronic conditions due in large part to the difficulty of diagnosing the root cause of symptoms.   Common Causes of Pulsatile Tinnitus Pulsatile Tinnitus can have many different origins, some fairly benign, others potentially life-threatening. Sources can include vascular malformations(aneurysms,dissection,venous anomalies), abnormal cerebral pressures, and unique blood flow patterns near the ear(arterio-venous fistula). The condition can also be caused by the presence of a tumor. Even when the causes of Pulsatile Tinnitus are fairly benign, its effects are sufficiently incapacitating for most patients to seek help.

## 2020-06-08 NOTE — Telephone Encounter (Signed)
Daniel Burton: [id number] (exp. 06/08/20 to 07/07/20). Patient is going to walk in at [place] at 9 AM.

## 2020-06-16 DIAGNOSIS — M545 Low back pain, unspecified: Secondary | ICD-10-CM | POA: Diagnosis not present

## 2020-06-16 DIAGNOSIS — E782 Mixed hyperlipidemia: Secondary | ICD-10-CM | POA: Diagnosis not present

## 2020-06-16 DIAGNOSIS — Z0189 Encounter for other specified special examinations: Secondary | ICD-10-CM | POA: Diagnosis not present

## 2020-06-16 DIAGNOSIS — I720 Aneurysm of carotid artery: Secondary | ICD-10-CM | POA: Diagnosis not present

## 2020-06-24 ENCOUNTER — Other Ambulatory Visit: Payer: Self-pay | Admitting: Radiology

## 2020-06-25 ENCOUNTER — Other Ambulatory Visit (HOSPITAL_COMMUNITY): Payer: Self-pay | Admitting: Interventional Radiology

## 2020-06-25 ENCOUNTER — Ambulatory Visit (HOSPITAL_COMMUNITY)
Admission: RE | Admit: 2020-06-25 | Discharge: 2020-06-25 | Disposition: A | Discharge status: Home / Self Care | Payer: BC Managed Care – PPO | Source: Ambulatory Visit | Attending: Interventional Radiology | Admitting: Interventional Radiology

## 2020-06-25 ENCOUNTER — Other Ambulatory Visit: Payer: Self-pay

## 2020-06-25 DIAGNOSIS — G9389 Other specified disorders of brain: Secondary | ICD-10-CM | POA: Diagnosis not present

## 2020-06-25 DIAGNOSIS — I671 Cerebral aneurysm, nonruptured: Secondary | ICD-10-CM

## 2020-06-25 DIAGNOSIS — Z87891 Personal history of nicotine dependence: Secondary | ICD-10-CM | POA: Diagnosis not present

## 2020-06-25 DIAGNOSIS — H93A1 Pulsatile tinnitus, right ear: Secondary | ICD-10-CM | POA: Diagnosis not present

## 2020-06-25 HISTORY — PX: IR ANGIO INTRA EXTRACRAN SEL COM CAROTID INNOMINATE BILAT MOD SED: IMG5360

## 2020-06-25 HISTORY — PX: IR ANGIO VERTEBRAL SEL SUBCLAVIAN INNOMINATE BILAT MOD SED: IMG5366

## 2020-06-25 LAB — CBC
HCT: 42.4 % (ref 39.0–52.0)
Hemoglobin: 13.8 g/dL (ref 13.0–17.0)
MCH: 27 pg (ref 26.0–34.0)
MCHC: 32.5 g/dL (ref 30.0–36.0)
MCV: 82.8 fL (ref 80.0–100.0)
Platelets: 246 10*3/uL (ref 150–400)
RBC: 5.12 MIL/uL (ref 4.22–5.81)
RDW: 14.7 % (ref 11.5–15.5)
WBC: 5.3 10*3/uL (ref 4.0–10.5)
nRBC: 0 % (ref 0.0–0.2)

## 2020-06-25 LAB — BASIC METABOLIC PANEL
Anion gap: 8 (ref 5–15)
BUN: 15 mg/dL (ref 6–20)
CO2: 25 mmol/L (ref 22–32)
Calcium: 9.2 mg/dL (ref 8.9–10.3)
Chloride: 107 mmol/L (ref 98–111)
Creatinine, Ser: 0.91 mg/dL (ref 0.61–1.24)
GFR, Estimated: >60 mL/min (ref >60)
Glucose, Bld: 126 mg/dL — ABNORMAL HIGH (ref 70–99)
Potassium: 3.8 mmol/L (ref 3.5–5.1)
Sodium: 140 mmol/L (ref 135–145)

## 2020-06-25 LAB — PROTIME-INR
INR: 0.9 (ref 0.8–1.2)
Prothrombin Time: 11.8 seconds (ref 11.4–15.2)

## 2020-06-25 IMAGING — XA IR ANGIO INTRA EXTRACRAN SEL COM CAROTID INNOMINATE BILAT MOD SE
1 series · 13 of 24 positions shown · IV contrast (IODINE)
Comparison: CT angiogram of the head and neck [DATE].

CLINICAL DATA: Debilitating right-sided pulsatile tinnitus,
left-sided pulsatile tinnitus.

EXAM:
BILATERAL COMMON CAROTID AND INNOMINATE ANGIOGRAPHY
TECHNIQUE: Informed written consent was obtained from the patient after a
thorough discussion of the procedural risks, benefits and
alternatives. All questions were addressed. Maximal Sterile Barrier
Technique was utilized including caps, mask, sterile gowns, sterile
gloves, sterile drape, hand hygiene and skin antiseptic. A timeout
was performed prior to the initiation of the procedure.

[Series 300: dr. (person_name) · 13 of 356 slices shown]
[im 1/356]
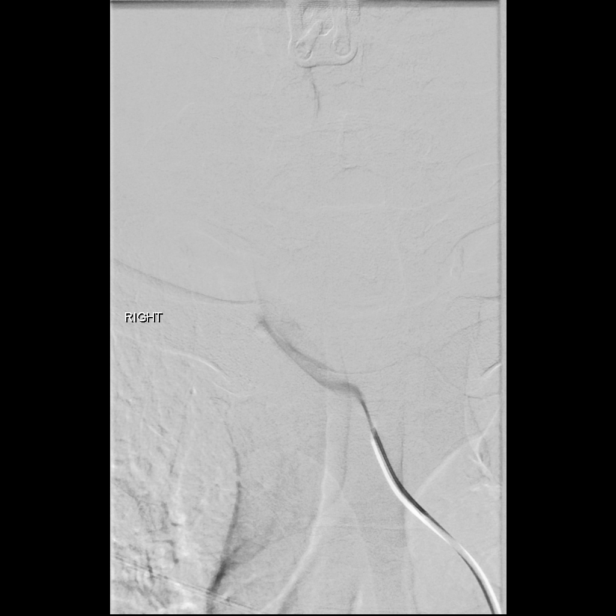
[im 31/356]
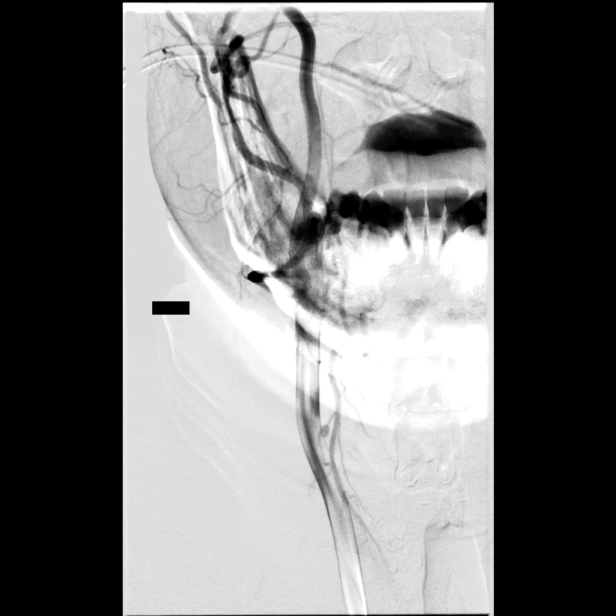
[im 62/356]
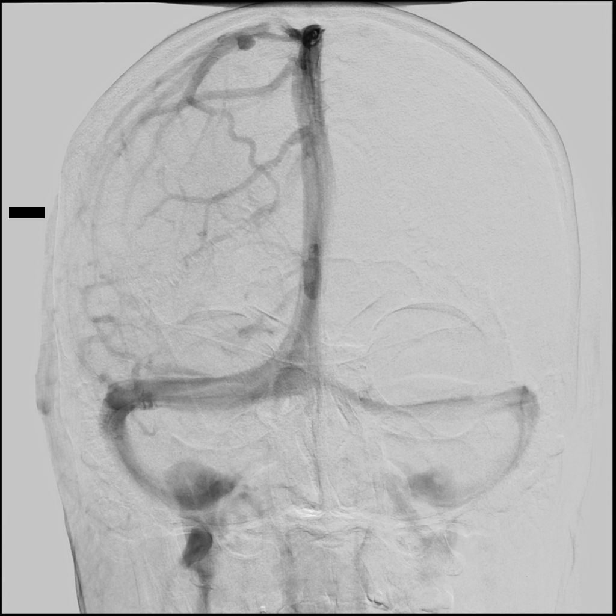
[im 93/356]
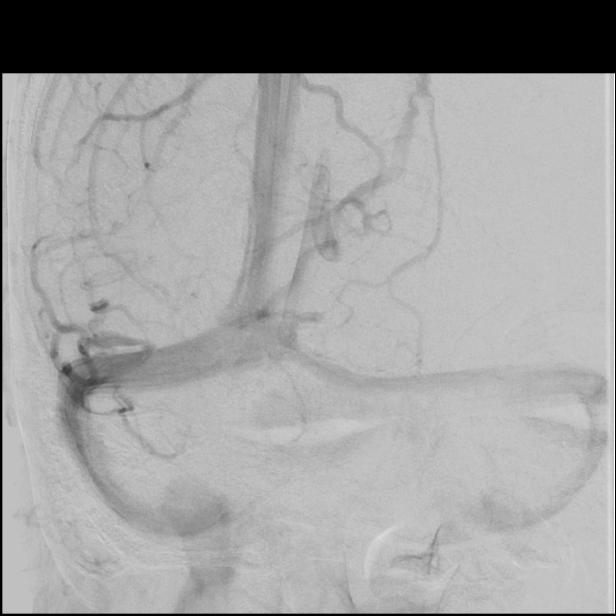
[im 124/356]
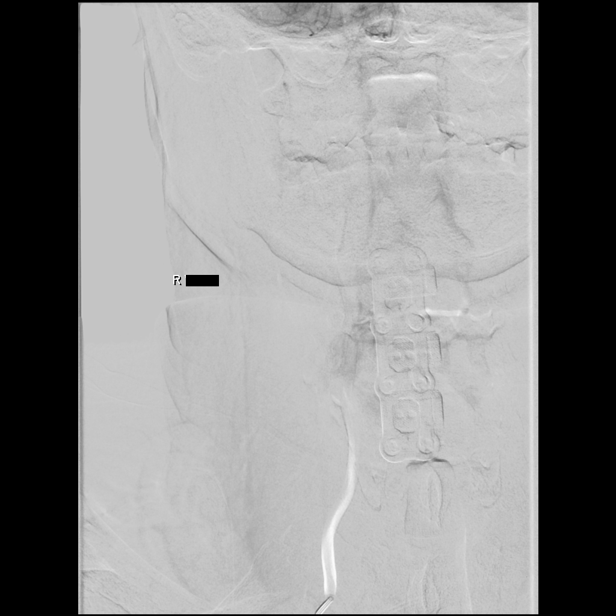
[im 155/356]
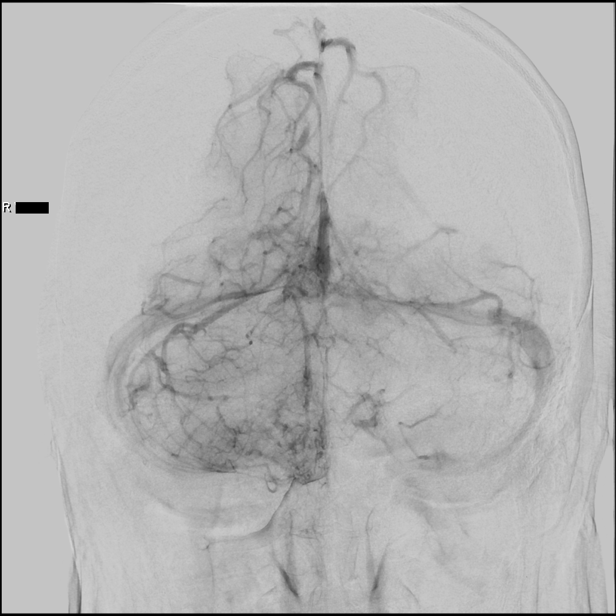
[im 186/356]
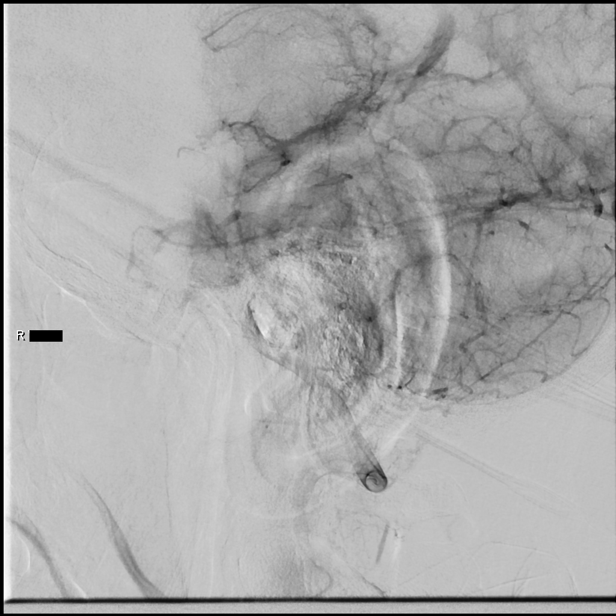
[im 201/356]
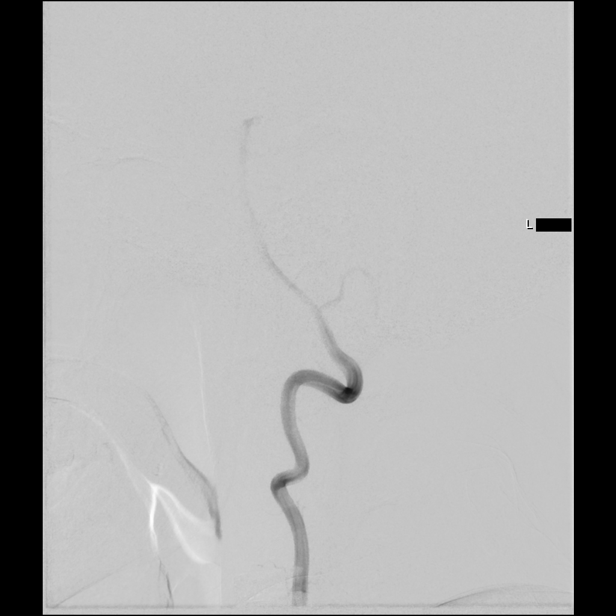
[im 232/356]
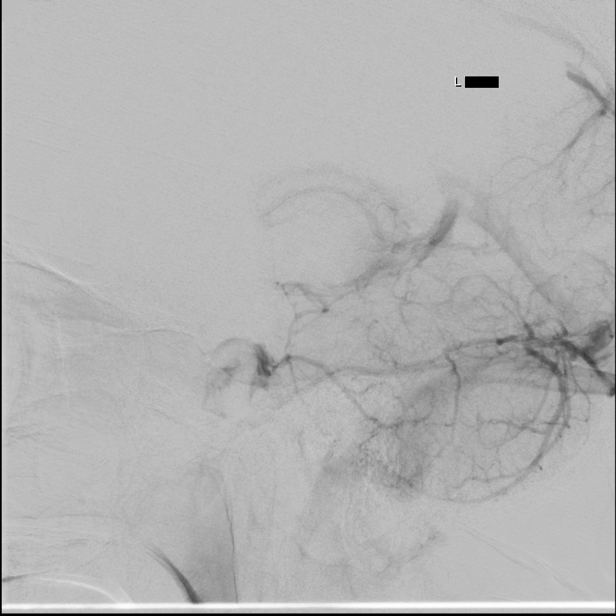
[im 263/356]
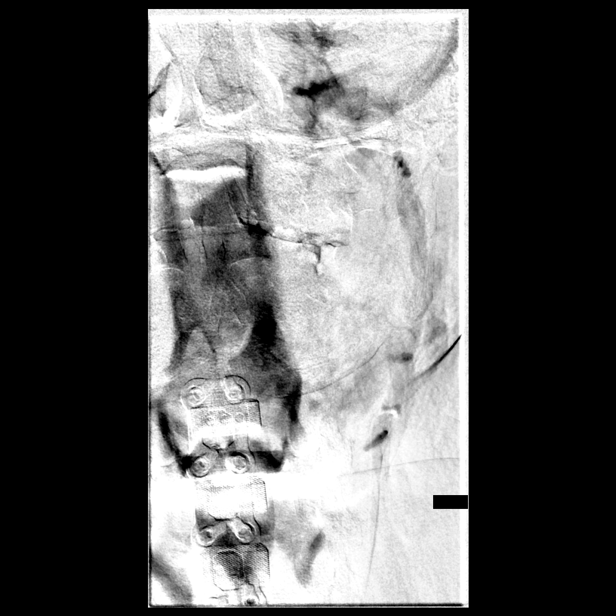
[im 294/356]
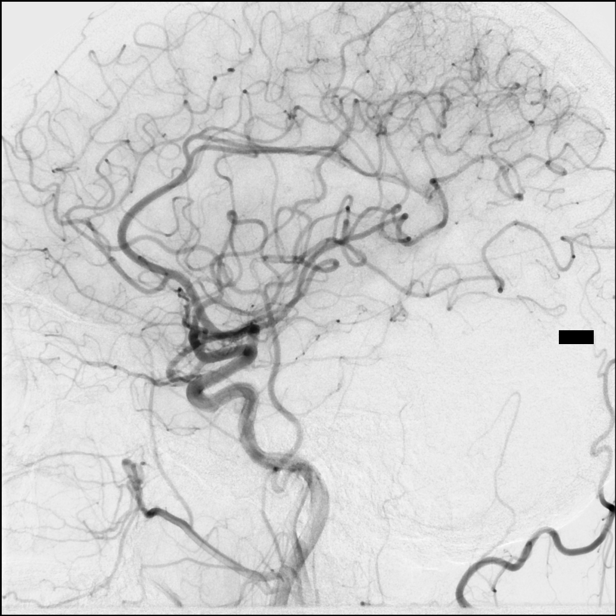
[im 325/356]
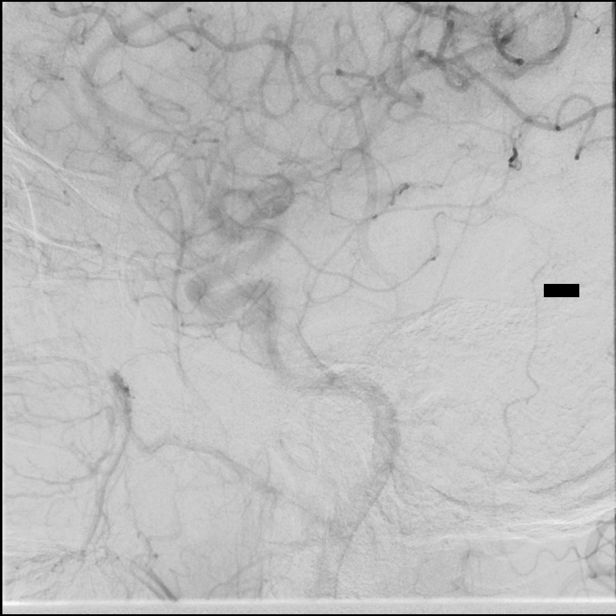
[im 356/356]
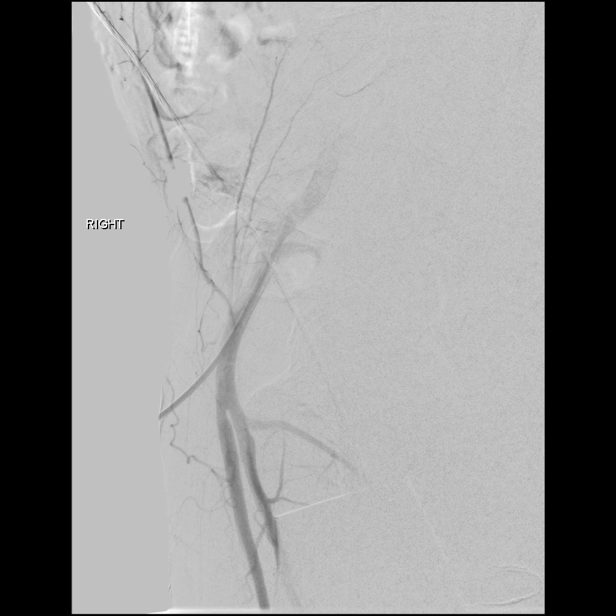

[13 of 24 positions shown; findings below may reference images not displayed]

MEDICATIONS:
Heparin [2Y] units IV. None antibiotic was administered within 1
hour of the procedure.

ANESTHESIA/SEDATION:
Versed 1 mg IV; Fentanyl 25 mcg IV

Moderate Sedation Time:  23 minutes

The patient was continuously monitored during the procedure by the
interventional radiology nurse under my direct supervision.

CONTRAST:  Isovue 300 approximately 65 mL

FLUOROSCOPY TIME:  Fluoroscopy Time: 8 minutes 24 seconds ([2Y]
mGy).

COMPLICATIONS:
None immediate.
The right groin was prepped and draped in the usual sterile fashion.
Thereafter using modified Seldinger technique, transfemoral access
into the right common femoral artery was obtained without
difficulty. Over a 0.035 inch guidewire, a 5 French Pinnacle sheath
was inserted. Through this, and also over 0.035 inch guidewire, a 5
French JB 1 catheter was advanced to the aortic arch region and
selectively positioned in the right common carotid artery,, the
right vertebral artery, the left common carotid artery and the left
vertebral artery.
FINDINGS: Right common carotid arteriogram demonstrates the right external
carotid artery and its major branches to be widely patent.

The right internal carotid artery at the bulb to the cranial skull
base is widely patent.

The petrous, the cavernous and the supraclinoid segments are widely
patent.

Arising in the right posterior communicating artery region is a
conical outpouching with a tiny vessel emanating at its apex. This
most likely represents an infundibulum.

Distal to this the right middle cerebral artery and the right
anterior cerebral artery opacify into the capillary and venous
phases.

The venous phase demonstrates a prominent high-riding jugular bulb
measuring approximately 10 mm x 13.9 mm projecting superiorly and
laterally. Also noted is stenosis of the internal jugular vein at
the skull base.

The right vertebral artery origin is widely patent.

The vessel is seen to opacify to the cranial skull base. Wide
patency is seen of the right vertebrobasilar junction and the right
posterior-inferior cerebellar artery.

The basilar artery, the posterior cerebral arteries, the superior
cerebellar arteries and the anterior-inferior cerebellar arteries
opacify into the capillary and venous phases. Again demonstrated is
the high-riding prominent right internal jugular bulb. The left
vertebral artery origin is widely patent.

The vessel opacifies to the cranial skull base. Patency is seen of
the left vertebrobasilar junction and the left posterior-inferior
cerebellar artery.

The basilar artery, the posterior cerebral arteries, the superior
cerebellar arteries and the anterior-inferior cerebellar arteries
opacify into the capillary and venous phases. The left common
carotid arteriogram demonstrates the left external carotid artery
and its major branches to be widely patent.

The left internal carotid artery at the bulb to the cranial skull
base is widely patent.

The petrous, the cavernous and the supraclinoid segments are widely
patent. Less than 1 mm infundibulum is seen at the origin of the
left posterior communicating artery.

The left middle cerebral artery and the left anterior cerebral
artery opacify into the capillary and venous phases. Again
demonstrated is the prominent high-riding right jugular bulb.
IMPRESSION: Prominent high-riding right jugular bulb projecting superiorly and
laterally measuring approximately 10 mm x 13.9 mm.

Relative narrowing of the right internal jugular vein at the skull
base.

Bilateral infundibulum in the posterior communicating artery
regions.

PLAN:
Findings reviewed with patient. Patient to return for consultation
regarding the diagnostic angiography with special attention to the
high-riding right jugular bulb.

## 2020-06-25 MED ORDER — FENTANYL CITRATE (PF) 100 MCG/2ML IJ SOLN
INTRAMUSCULAR | Status: AC | PRN
  Administered 2020-06-25: 25 ug via INTRAVENOUS

## 2020-06-25 MED ORDER — IOHEXOL 300 MG/ML  SOLN: 150.0000 mL | Freq: Once | INTRAMUSCULAR | Status: DC | PRN

## 2020-06-25 MED ORDER — HEPARIN SODIUM (PORCINE) 1000 UNIT/ML IJ SOLN
INTRAMUSCULAR | Status: AC | PRN
  Administered 2020-06-25: 1000 [IU] via INTRAVENOUS

## 2020-06-25 MED ORDER — SODIUM CHLORIDE 0.9 % IV SOLN: Freq: Once | INTRAVENOUS | Status: DC

## 2020-06-25 MED ORDER — MIDAZOLAM HCL 2 MG/2ML IJ SOLN
INTRAMUSCULAR | Status: AC
  Filled 2020-06-25: qty 4

## 2020-06-25 MED ORDER — FENTANYL CITRATE (PF) 100 MCG/2ML IJ SOLN
INTRAMUSCULAR | Status: AC
  Filled 2020-06-25: qty 2

## 2020-06-25 MED ORDER — SODIUM CHLORIDE 0.9 % IV SOLN: INTRAVENOUS | Status: AC

## 2020-06-25 MED ORDER — HEPARIN SODIUM (PORCINE) 1000 UNIT/ML IJ SOLN
INTRAMUSCULAR | Status: AC
  Filled 2020-06-25: qty 1

## 2020-06-25 MED ORDER — MIDAZOLAM HCL 2 MG/2ML IJ SOLN
INTRAMUSCULAR | Status: AC | PRN
  Administered 2020-06-25: 1 mg via INTRAVENOUS

## 2020-06-25 MED ORDER — LIDOCAINE HCL 1 % IJ SOLN
INTRAMUSCULAR | Status: AC
  Filled 2020-06-25: qty 20

## 2020-06-25 MED ORDER — LIDOCAINE HCL 1 % IJ SOLN
INTRAMUSCULAR | Status: AC | PRN
  Administered 2020-06-25: 10 mL

## 2020-06-25 NOTE — Discharge Instructions (Signed)
Moderate Conscious Sedation, Adult, Care After This sheet gives you information about how to care for yourself after your procedure. Your health care provider may also give you more specific instructions. If you have problems or questions, contact your health care provider. What can I expect after the procedure? After the procedure, it is common to have:  Sleepiness for several hours.  Impaired judgment for several hours.  Difficulty with balance.  Vomiting if you eat too soon. Follow these instructions at home: For the time period you were told by your health care provider:  Rest.  Do not participate in activities where you could fall or become injured.  Do not drive or use machinery.  Do not drink alcohol.  Do not take sleeping pills or medicines that cause drowsiness.  Do not make important decisions or sign legal documents.  Do not take care of children on your own.      Eating and drinking  Follow the diet recommended by your health care provider.  Drink enough fluid to keep your urine pale yellow.  If you vomit: ? Drink water, juice, or soup when you can drink without vomiting. ? Make sure you have little or no nausea before eating solid foods.   General instructions  Take over-the-counter and prescription medicines only as told by your health care provider.  Have a responsible adult stay with you for the time you are told. It is important to have someone help care for you until you are awake and alert.  Do not smoke.  Keep all follow-up visits as told by your health care provider. This is important. Contact a health care provider if:  You are still sleepy or having trouble with balance after 24 hours.  You feel light-headed.  You keep feeling nauseous or you keep vomiting.  You develop a rash.  You have a fever.  You have redness or swelling around the IV site. Get help right away if:  You have trouble breathing.  You have new-onset confusion at  home. Summary  After the procedure, it is common to feel sleepy, have impaired judgment, or feel nauseous if you eat too soon.  Rest after you get home. Know the things you should not do after the procedure.  Follow the diet recommended by your health care provider and drink enough fluid to keep your urine pale yellow.  Get help right away if you have trouble breathing or new-onset confusion at home. This information is not intended to replace advice given to you by your health care provider. Make sure you discuss any questions you have with your health care provider. Document Revised: 07/11/2019 Document Reviewed: 02/06/2019 Elsevier Patient Education  2021 [place] After This sheet gives you information about how to care for yourself after your procedure. Your doctor may also give you more specific instructions. If you have problems or questions, contact your doctor. What can I expect after the procedure? After the procedure, it is common to have these problems at the point where the catheter was inserted:  Bruising.  Tenderness.  A collection of blood (hematoma). This may feel like a small lump under the skin at the insertion site. Follow these instructions at home: Insertion site care  Follow instructions from your doctor about how to take care of the area where the catheter was inserted. Make sure you: ? Wash your hands with soap and water before you change your bandage (dressing). If you cannot use soap and water, use hand  sanitizer. ? Change your bandage as told by your doctor.  Do not take baths, swim, or use a hot tub until your doctor says it is okay.  You may shower 24-48 hours after the procedure, or as told by your doctor. To clean the area: ? Gently wash the area with plain soap and water. ? Pat the area dry with a clean towel. ? Do not rub the area. This may cause bleeding.  Check your insertion area every day for signs of infection. Check  for: ? Redness, swelling, or pain. ? Fluid or blood. ? Warmth. ? Pus or a bad smell.  Do not apply powder or lotion to the area. Keep the area clean and dry.   Activity  Do not drive for 24 hours if you were given a medicine to help you relax (sedative).  Rest as told by your doctor, usually for 1-2 days.  Do not lift anything that is heavier than 10 lbs. (4.5 kg) or as told by your doctor.  If your insertion site was in your leg, try to avoid stairs for a few days.  Return to your normal activities as told by your doctor. Ask your doctor what activities are safe for you. General instructions  If the insertion area starts to bleed, lie flat and put pressure on the area. If the bleeding does not stop, get help right away. This is an emergency.  Take over-the-counter and prescription medicines only as told by your doctor.  Drink enough fluid to keep your pee (urine) pale yellow.  Keep all follow-up visits as told by your doctor. This is important.   Contact a doctor if:  You have a fever.  You have chills.  You have redness, swelling, or pain around your insertion area.  You have fluid or blood coming from your insertion area.  The insertion area feels warm to the touch.  You have pus or a bad smell coming from your insertion area.  You have more bruising around the insertion area. Get help right away if:  You have a lot of pain in the insertion area.  The insertion area swells very fast.  The insertion area is bleeding, and the bleeding does not stop after you hold steady pressure on the area.  The area around the insertion area becomes pale, cool, tingly, or numb.  You have chest pain.  You have trouble breathing.  You have a rash.  You have any signs of a stroke. "BE FAST" is an easy way to remember the main warning signs: ? B - Balance. Signs are dizziness, sudden trouble walking, or loss of balance. ? E - Eyes. Signs are trouble seeing or a change in how  you see. ? F - Face. Signs are sudden weakness or loss of feeling of the face, or the face or eyelid drooping on one side. ? A - Arms. Signs are weakness or loss of feeling in an arm. This happens suddenly and usually on one side of the body. ? S - Speech. Signs are sudden trouble speaking, slurred speech, or trouble understanding what people say. ? T - Time. Time to call emergency services. Write down what time symptoms started.  You have other signs of a stroke, such as: ? A sudden, very bad headache with no known cause. ? Feeling like you may vomit (nausea). ? Vomiting. ? A seizure. These symptoms may be an emergency. Do not wait to see if the symptoms will go away. Get medical  help right away. Call your local emergency services (911 in the [place] Do not drive yourself to the hospital. Summary  After the procedure, it is common to have bruising and tenderness at the insertion area.  Do not take baths, swim, or use a hot tub until your doctor says it is okay to do so. You may shower 24-48 hours after the procedure or as told by your doctor.  It is important to rest and drink plenty of fluids.  If the insertion area starts to bleed, lie flat and put pressure on the area. If the bleeding does not stop, get help right away. This is an emergency. This information is not intended to replace advice given to you by your health care provider. Make sure you discuss any questions you have with your health care provider. Document Revised: 01/15/2019 Document Reviewed: 01/15/2019 Elsevier Patient Education  [place].

## 2020-06-25 NOTE — H&P (Signed)
Chief Complaint: Pulsatile tinnitus of the right ear - aneurysm. Request is for cerebral angiogram  Referring Physician(s): Dr. Loni Muse. Jaynee Eagles  Supervising Physician: Luanne Bras  Patient Status: [facility] - Out-pt  History of Present Illness: Daniel Burton is a 60 y.o. male History of  Chronic back pain, cervical surgery in December 2021, HLD,  chronic nasal congestion. Reporting pulsatile tinnitus of the right ear with decrease in hearing since December 2020 and accompanying headaches described as "throbbing". CT angiogram from 3.15.22 reads Suboptimal intracranial arterial contrast timing. 2. No intracranial large vessel occlusion or proximal high-grade arterial stenosis. 3. 1-2 mm aneurysm arising from the distal cavernous right ICA versus small focus of calcified plaque within the vessel wall. 4. 1-2 mm aneurysm versus infundibulum arising from the supraclinoid right ICA. 5. The right jugular bulb is prominent and slightly higher in position as compared to the left. Patient presents for cerebral angiogram.   Currently without any significant complaints. Patient alert and laying in bed, calm and comfortable watching telvision. Denies any fevers, chest pain, SOB, cough, abdominal pain, nausea, vomiting or bleeding.       Past Medical History:  Diagnosis Date  . Back pain with history of spinal surgery   . High cholesterol     Past Surgical History:  Procedure Laterality Date  . CERVICAL SPINE SURGERY  2021  . ELBOW SURGERY Right 1979  . HERNIA REPAIR      Allergies: Patient has no known allergies.  Medications: Prior to Admission medications   Medication Sig Start Date End Date Taking? Authorizing Provider  ibuprofen (ADVIL) 200 MG tablet Take 200 mg by mouth every 6 (six) hours as needed for headache or moderate pain.   Yes [provider]  acetaZOLAMIDE (DIAMOX) 125 MG tablet Take 1 tablet (125 mg total) by mouth 2 (two) times daily. Patient not taking: No  sig reported 06/08/20   Melvenia Beam, MD  fluticasone [facility]) 50 MCG/ACT nasal spray Place 1 spray into both nostrils daily for 14 days. 04/10/20 04/24/20  Emerson Monte, FNP     Family History  Problem Relation Age of Onset  . Diabetes Mother   . Throat cancer Father   . Liver cancer Sister   . Diabetes Brother   . Thyroid disease Sister   . Allergic rhinitis Neg Hx   . Angioedema Neg Hx   . Asthma Neg Hx   . Urticaria Neg Hx     Social History   Socioeconomic History  . Marital status: Married    Spouse name: Not on file  . Number of children: 2  . Years of education: 62  . Highest education level: Not on file  Occupational History  . Not on file  Tobacco Use  . Smoking status: Former Smoker    Types: Cigarettes  . Smokeless tobacco: Former Network engineer  . Vaping Use: Never used  Substance and Sexual Activity  . Alcohol use: No  . Drug use: Not Currently  . Sexual activity: Not on file  Other Topics Concern  . Not on file  Social History Narrative   Lives at home with wife   Right handed   Caffeine: tea occasionally    Social Determinants of Health   Financial Resource Strain: Not on file  Food Insecurity: Not on file  Transportation Needs: Not on file  Physical Activity: Not on file  Stress: Not on file  Social Connections: Not on file     Review  of Systems: A 12 point ROS discussed and pertinent positives are indicated in the HPI above.  All other systems are negative.  Review of Systems  Constitutional: Negative for fever.  HENT: Negative for congestion.   Respiratory: Negative for cough and shortness of breath.   Cardiovascular: Negative for chest pain.  Gastrointestinal: Negative for abdominal pain.  Neurological: Positive for numbness ( to left hand he associates with his cervical surgery in December 2021) and headaches ( Throbbing).  Psychiatric/Behavioral: Negative for behavioral problems and confusion.    Vital Signs: BP  136/69   Pulse 67   Temp 97.6 F (36.4 C) (Oral)   Resp 19   Ht 6' (1.829 m)   Wt 160 lb (72.6 kg)   SpO2 99%   BMI 21.70 kg/m   Physical Exam Vitals and nursing note reviewed.  Constitutional:      Appearance: He is well-developed.  HENT:     Head: Normocephalic.  Pulmonary:     Effort: Pulmonary effort is normal.  Musculoskeletal:        General: Normal range of motion.     Cervical back: Normal range of motion.  Skin:    General: Skin is dry.  Neurological:     Mental Status: He is alert and oriented to person, place, and time.     Comments: Alert, aware and oriented X 3 Speech and comprehension is intact.  PERRL bilaterally No facial droop noted Tongue midline Can spontaneously move all 4 extremities. Hand grip strength equal bilaterally. Speech, cognition and language  are generally intact.  Comprehension and fluency are normal.  Judgment and insight norma Fine motor and coordinationgrossly in tact Gaitnot assessed Rombergnot assessed Heel to toenot assessed Distal pulsesnot assessed      Imaging: CT ANGIO HEAD W OR WO CONTRAST  Result Date: 06/08/2020 CLINICAL DATA:  Carotid aneurysm, right. Pulsatile tinnitus of right ear. He hearing abnormally acute, right. Dissection of carotid artery. Tinnitus, pulsatile. Additional history provided by scanning technologist: Patient reports right carotid aneurysm, pulsatile tinnitus of right ear, hearing abnormality, throbbing in head, symptoms for months. EXAM: CT ANGIOGRAPHY HEAD AND NECK TECHNIQUE: Multidetector CT imaging of the head and neck was performed using the standard protocol during bolus administration of intravenous contrast. Multiplanar CT image reconstructions and MIPs were obtained to evaluate the vascular anatomy. Carotid stenosis measurements (when applicable) are obtained utilizing NASCET criteria, using the distal internal carotid diameter as the denominator. CONTRAST:  57mL ISOVUE-370 IOPAMIDOL  (ISOVUE-370) INJECTION 76% COMPARISON:  Maxillofacial CT 01/28/2020. CT of the cervical spine 02/12/2020. FINDINGS: CT HEAD FINDINGS Brain: Cerebral volume is normal. Moderate patchy and ill-defined hypoattenuation within the cerebral white matter is nonspecific, but most often secondary to chronic small vessel ischemia. There is no acute intracranial hemorrhage. No demarcated cortical infarct. No extra-axial fluid collection. No evidence of intracranial mass. No midline shift. Vascular: No hyperdense vessel.  Atherosclerotic calcifications Skull: Normal. Negative for fracture or focal lesion. Sinuses: Mild bilateral ethmoid sinus mucosal thickening. Orbits: No mass or acute finding. Review of the MIP images confirms the above findings CTA NECK FINDINGS Aortic arch: Common origin of the innominate and left common carotid arteries. Atherosclerotic plaque within the proximal major branch vessels of the neck. No hemodynamically significant innominate or proximal subclavian artery stenosis. Right carotid system: CCA and ICA patent within the neck. Soft and calcified plaque within the proximal ICA resulting in less than 50% stenosis. Left carotid system: CCA and ICA patent within the neck without stenosis. Minimal  soft and calcified plaque within the carotid bifurcation and proximal ICA. Vertebral arteries: Codominant and patent within the neck. Mild atherosclerotic narrowing at the origin of the left vertebral artery. Skeleton: Redemonstrated sequela of prior C2-C6 ACDF. No acute bony abnormality or aggressive osseous lesion. Other neck: No neck mass or cervical lymphadenopathy. Subcentimeter thyroid nodules not meeting consensus criteria for ultrasound follow-up. Upper chest: 8 mm ground-glass nodule within the posterior aspect of the right upper lobe. Review of the MIP images confirms the above findings CTA HEAD FINDINGS Anterior circulation: Suboptimal intracranial contrast timing. There is a normal course of the  intracranial internal carotid arteries. Scattered soft and calcified plaque within both vessels with no more than mild stenosis. The intracranial internal carotid arteries are patent. The M1 middle cerebral arteries are patent. No M2 proximal branch occlusion or high-grade proximal stenosis is identified. The anterior cerebral arteries are patent. No intracranial aneurysm is identified. 1-2 mm aneurysm arising from the distal cavernous right ICA versus small focus of calcified plaque (series 13, image 114) (series 15, image 279). 1-2 mm posteriorly projecting vascular protrusion arising from the supraclinoid right ICA which may reflect an aneurysm or infundibulum (series 13, image 116). Posterior circulation: The intracranial vertebral arteries are patent. The basilar artery is patent. The posterior cerebral arteries are patent. Posterior communicating arteries are hypoplastic or absent bilaterally. Venous sinuses: Within the limitations of contrast timing, no convincing thrombus. The right jugular bulb is prominent and slightly higher in position as compared to the left. Anatomic variants: As described Review of the MIP images confirms the above findings IMPRESSION: CT head: 1. No evidence of acute intracranial abnormality. 2. Moderate cerebral white matter disease, nonspecific but most often secondary to chronic small vessel ischemia. 3. Mild bilateral ethmoid sinus mucosal thickening. CTA neck: 1. The bilateral common carotid and internal carotid arteries are patent within the neck without hemodynamically significant stenosis (50% or greater). Atherosclerotic plaque within the proximal right ICA, left carotid bifurcation and proximal left ICA as described. No evidence of carotid artery dissection or aneurysm. 2. Vertebral arteries patent within the neck. Mild atherosclerotic narrowing at the origin of the left vertebral artery. 3. 8 mm ground-glass right upper lobe pulmonary nodule. Initial follow-up with CT at  6-12 months is recommended to confirm persistence. If persistent, repeat CT is recommended every 2 years until 5 years of stability has been established. This recommendation follows the consensus statement: Guidelines for Management of Incidental Pulmonary Nodules Detected on CT Images: From the Fleischner Society 2017; Radiology 2017; 284:228-243. CTA head: 1. Suboptimal intracranial arterial contrast timing. 2. No intracranial large vessel occlusion or proximal high-grade arterial stenosis. 3. 1-2 mm aneurysm arising from the distal cavernous right ICA versus small focus of calcified plaque within the vessel wall. 4. 1-2 mm aneurysm versus infundibulum arising from the supraclinoid right ICA. 5. The right jugular bulb is prominent and slightly higher in position as compared to the left. Electronically Signed   By: Kellie Simmering DO   On: 06/08/2020 10:41   CT ANGIO NECK W OR WO CONTRAST  Result Date: 06/08/2020 CLINICAL DATA:  Carotid aneurysm, right. Pulsatile tinnitus of right ear. He hearing abnormally acute, right. Dissection of carotid artery. Tinnitus, pulsatile. Additional history provided by scanning technologist: Patient reports right carotid aneurysm, pulsatile tinnitus of right ear, hearing abnormality, throbbing in head, symptoms for months. EXAM: CT ANGIOGRAPHY HEAD AND NECK TECHNIQUE: Multidetector CT imaging of the head and neck was performed using the standard protocol during bolus administration  of intravenous contrast. Multiplanar CT image reconstructions and MIPs were obtained to evaluate the vascular anatomy. Carotid stenosis measurements (when applicable) are obtained utilizing NASCET criteria, using the distal internal carotid diameter as the denominator. CONTRAST:  84mL ISOVUE-370 IOPAMIDOL (ISOVUE-370) INJECTION 76% COMPARISON:  Maxillofacial CT 01/28/2020. CT of the cervical spine 02/12/2020. FINDINGS: CT HEAD FINDINGS Brain: Cerebral volume is normal. Moderate patchy and ill-defined  hypoattenuation within the cerebral white matter is nonspecific, but most often secondary to chronic small vessel ischemia. There is no acute intracranial hemorrhage. No demarcated cortical infarct. No extra-axial fluid collection. No evidence of intracranial mass. No midline shift. Vascular: No hyperdense vessel.  Atherosclerotic calcifications Skull: Normal. Negative for fracture or focal lesion. Sinuses: Mild bilateral ethmoid sinus mucosal thickening. Orbits: No mass or acute finding. Review of the MIP images confirms the above findings CTA NECK FINDINGS Aortic arch: Common origin of the innominate and left common carotid arteries. Atherosclerotic plaque within the proximal major branch vessels of the neck. No hemodynamically significant innominate or proximal subclavian artery stenosis. Right carotid system: CCA and ICA patent within the neck. Soft and calcified plaque within the proximal ICA resulting in less than 50% stenosis. Left carotid system: CCA and ICA patent within the neck without stenosis. Minimal soft and calcified plaque within the carotid bifurcation and proximal ICA. Vertebral arteries: Codominant and patent within the neck. Mild atherosclerotic narrowing at the origin of the left vertebral artery. Skeleton: Redemonstrated sequela of prior C2-C6 ACDF. No acute bony abnormality or aggressive osseous lesion. Other neck: No neck mass or cervical lymphadenopathy. Subcentimeter thyroid nodules not meeting consensus criteria for ultrasound follow-up. Upper chest: 8 mm ground-glass nodule within the posterior aspect of the right upper lobe. Review of the MIP images confirms the above findings CTA HEAD FINDINGS Anterior circulation: Suboptimal intracranial contrast timing. There is a normal course of the intracranial internal carotid arteries. Scattered soft and calcified plaque within both vessels with no more than mild stenosis. The intracranial internal carotid arteries are patent. The M1 middle  cerebral arteries are patent. No M2 proximal branch occlusion or high-grade proximal stenosis is identified. The anterior cerebral arteries are patent. No intracranial aneurysm is identified. 1-2 mm aneurysm arising from the distal cavernous right ICA versus small focus of calcified plaque (series 13, image 114) (series 15, image 279). 1-2 mm posteriorly projecting vascular protrusion arising from the supraclinoid right ICA which may reflect an aneurysm or infundibulum (series 13, image 116). Posterior circulation: The intracranial vertebral arteries are patent. The basilar artery is patent. The posterior cerebral arteries are patent. Posterior communicating arteries are hypoplastic or absent bilaterally. Venous sinuses: Within the limitations of contrast timing, no convincing thrombus. The right jugular bulb is prominent and slightly higher in position as compared to the left. Anatomic variants: As described Review of the MIP images confirms the above findings IMPRESSION: CT head: 1. No evidence of acute intracranial abnormality. 2. Moderate cerebral white matter disease, nonspecific but most often secondary to chronic small vessel ischemia. 3. Mild bilateral ethmoid sinus mucosal thickening. CTA neck: 1. The bilateral common carotid and internal carotid arteries are patent within the neck without hemodynamically significant stenosis (50% or greater). Atherosclerotic plaque within the proximal right ICA, left carotid bifurcation and proximal left ICA as described. No evidence of carotid artery dissection or aneurysm. 2. Vertebral arteries patent within the neck. Mild atherosclerotic narrowing at the origin of the left vertebral artery. 3. 8 mm ground-glass right upper lobe pulmonary nodule. Initial follow-up with CT at  6-12 months is recommended to confirm persistence. If persistent, repeat CT is recommended every 2 years until 5 years of stability has been established. This recommendation follows the consensus  statement: Guidelines for Management of Incidental Pulmonary Nodules Detected on CT Images: From the Fleischner Society 2017; Radiology 2017; 284:228-243. CTA head: 1. Suboptimal intracranial arterial contrast timing. 2. No intracranial large vessel occlusion or proximal high-grade arterial stenosis. 3. 1-2 mm aneurysm arising from the distal cavernous right ICA versus small focus of calcified plaque within the vessel wall. 4. 1-2 mm aneurysm versus infundibulum arising from the supraclinoid right ICA. 5. The right jugular bulb is prominent and slightly higher in position as compared to the left. Electronically Signed   By: Kellie Simmering DO   On: 06/08/2020 10:41    Labs:  CBC: Recent Labs    08/04/19 0000 11/28/19 1159  WBC 6.2 5.7  HGB 13.4 13.5  HCT 41.8 41.5  PLT 266  --     COAGS: No results for input(s): INR, APTT in the last 8760 hours.  BMP: Recent Labs    08/04/19 0000 11/28/19 1159 02/11/20 1409  NA 141 146*  --   K 3.6 4.3  --   CL 103 108*  --   CO2 28 25  --   GLUCOSE 99 102*  --   BUN 17 16  --   CALCIUM 9.3 9.7  --   CREATININE 0.92 0.96 1.00  GFRNONAA >60 87  --   GFRAA >60 100  --     LIVER FUNCTION TESTS: Recent Labs    08/04/19 0000 11/28/19 1159  BILITOT 0.5 0.4  AST 29 24  ALT 26 22  ALKPHOS 93 112  PROT 7.9 7.3  ALBUMIN 4.4 4.8    Assessment and Plan:  60 y.o. male outpatient. History of  Chronic back pain s/p cervical surgery in December 2021, HLD,  chronic nasal congestion. Reporting pulsatile tinnitus of the right ear with decrease in hearing since December 2020 and accompanying headaches described as "throbbing".s. CT angiogram from 3.15.22 reads Suboptimal intracranial arterial contrast timing. 2. No intracranial large vessel occlusion or proximal high-grade. arterial stenosis. 3. 1-2 mm aneurysm arising from the distal cavernous right ICA versus small focus of calcified plaque within the vessel wall. 4. 1-2 mm aneurysm versus infundibulum  arising from the supraclinoid right ICA. 5. The right jugular bulb is prominent and slightly higher in position as compared to the left. Patient presents for cerebral angiogram.   Condition is persistent in nature. Worse in the morning upon rising, with exertion and when he lays on his right side. All labs and medications are within acceptable parameters. NKDA. Patient has been NPO since midnight.  Risks and benefits of cerebral angiogram were discussed with the patient including, but not limited to bleeding, infection, vascular injury or contrast induced renal failure.  This interventional procedure involves the use of X-rays and because of the nature of the planned procedure, it is possible that we will have prolonged use of X-ray fluoroscopy.  Potential radiation risks to you include (but are not limited to) the following: - A slightly elevated risk for cancer  several years later in life. This risk is typically less than 0.5% percent. This risk is low in comparison to the normal incidence of human cancer, which is 33% for women and 50% for men according to the [place]. - Radiation induced injury can include skin redness, resembling a rash, tissue breakdown / ulcers and hair  loss (which can be temporary or permanent).   The likelihood of either of these occurring depends on the difficulty of the procedure and whether you are sensitive to radiation due to previous procedures, disease, or genetic conditions.   IF your procedure requires a prolonged use of radiation, you will be notified and given written instructions for further action.  It is your responsibility to monitor the irradiated area for the 2 weeks following the procedure and to notify your physician if you are concerned that you have suffered a radiation induced injury.    All of the patient's questions were answered, patient is agreeable to proceed.  Consent signed and in chart.    Thank you for this interesting  consult.  I greatly enjoyed meeting Daniel Burton and look forward to participating in their care.  A copy of this report was sent to the requesting provider on this date.  Electronically Signed: Jacqualine Mau, NP 06/25/2020, 7:16 AM   I spent a total of  40 Minutes   in face to face in clinical consultation, greater than 50% of which was counseling/coordinating care for cerebral angiogram

## 2020-06-25 NOTE — Procedures (Signed)
S/P 4 vessel cerebral arteriogram  Rt CFA approach. Findings . 1.Prominenent RT Jugular bulb measuring 12.mm x 13.6 mm projecting sup and laterally./ S.Rhylan Kagel MD

## 2020-06-28 ENCOUNTER — Telehealth (HOSPITAL_COMMUNITY): Payer: Self-pay

## 2020-06-28 ENCOUNTER — Other Ambulatory Visit (HOSPITAL_COMMUNITY): Payer: Self-pay | Admitting: Interventional Radiology

## 2020-06-28 DIAGNOSIS — H93A1 Pulsatile tinnitus, right ear: Secondary | ICD-10-CM

## 2020-06-28 NOTE — Telephone Encounter (Signed)
Called to schedule consult, no answer, no vm. AW  

## 2020-07-09 ENCOUNTER — Ambulatory Visit: Payer: BC Managed Care – PPO | Admitting: Family Medicine

## 2020-07-09 ENCOUNTER — Ambulatory Visit (HOSPITAL_COMMUNITY)
Admission: RE | Admit: 2020-07-09 | Discharge: 2020-07-09 | Disposition: A | Discharge status: Home / Self Care | Payer: BC Managed Care – PPO | Source: Ambulatory Visit | Attending: Interventional Radiology | Admitting: Interventional Radiology

## 2020-07-09 ENCOUNTER — Other Ambulatory Visit: Payer: Self-pay

## 2020-07-09 DIAGNOSIS — H93A1 Pulsatile tinnitus, right ear: Secondary | ICD-10-CM | POA: Diagnosis not present

## 2020-07-09 DIAGNOSIS — G9389 Other specified disorders of brain: Secondary | ICD-10-CM | POA: Diagnosis not present

## 2020-07-13 HISTORY — PX: IR RADIOLOGIST EVAL & MGMT: IMG5224

## 2020-07-14 ENCOUNTER — Other Ambulatory Visit (HOSPITAL_COMMUNITY): Payer: BC Managed Care – PPO

## 2020-07-19 ENCOUNTER — Other Ambulatory Visit (HOSPITAL_COMMUNITY): Payer: Self-pay | Admitting: Interventional Radiology

## 2020-07-26 ENCOUNTER — Other Ambulatory Visit (HOSPITAL_COMMUNITY): Payer: Self-pay | Admitting: Interventional Radiology

## 2020-07-28 DIAGNOSIS — Z1211 Encounter for screening for malignant neoplasm of colon: Secondary | ICD-10-CM | POA: Diagnosis not present

## 2020-07-28 DIAGNOSIS — D125 Benign neoplasm of sigmoid colon: Secondary | ICD-10-CM | POA: Diagnosis not present

## 2020-07-28 DIAGNOSIS — K648 Other hemorrhoids: Secondary | ICD-10-CM | POA: Diagnosis not present

## 2020-07-28 DIAGNOSIS — K5730 Diverticulosis of large intestine without perforation or abscess without bleeding: Secondary | ICD-10-CM | POA: Diagnosis not present

## 2020-07-28 DIAGNOSIS — D124 Benign neoplasm of descending colon: Secondary | ICD-10-CM | POA: Diagnosis not present

## 2020-07-28 DIAGNOSIS — D123 Benign neoplasm of transverse colon: Secondary | ICD-10-CM | POA: Diagnosis not present

## 2020-08-02 ENCOUNTER — Other Ambulatory Visit (HOSPITAL_COMMUNITY): Payer: Self-pay | Admitting: Interventional Radiology

## 2020-08-02 ENCOUNTER — Telehealth (HOSPITAL_COMMUNITY): Payer: Self-pay

## 2020-08-02 DIAGNOSIS — H93A1 Pulsatile tinnitus, right ear: Secondary | ICD-10-CM

## 2020-08-02 NOTE — Telephone Encounter (Signed)
Called to schedule ct temporal bone, no answer, left vm. AW  

## 2020-08-04 ENCOUNTER — Telehealth (HOSPITAL_COMMUNITY): Payer: Self-pay

## 2020-08-04 NOTE — Telephone Encounter (Signed)
Pt returned call. He will call back once he looks at schedule to set up scan. AW

## 2020-08-11 ENCOUNTER — Ambulatory Visit (HOSPITAL_COMMUNITY)
Admission: RE | Admit: 2020-08-11 | Discharge: 2020-08-11 | Disposition: A | Discharge status: Home / Self Care | Payer: BC Managed Care – PPO | Source: Ambulatory Visit | Attending: Interventional Radiology | Admitting: Interventional Radiology

## 2020-08-11 ENCOUNTER — Other Ambulatory Visit: Payer: Self-pay

## 2020-08-11 DIAGNOSIS — J3489 Other specified disorders of nose and nasal sinuses: Secondary | ICD-10-CM | POA: Diagnosis not present

## 2020-08-11 DIAGNOSIS — H93A1 Pulsatile tinnitus, right ear: Secondary | ICD-10-CM | POA: Diagnosis not present

## 2020-08-11 DIAGNOSIS — H93A9 Pulsatile tinnitus, unspecified ear: Secondary | ICD-10-CM | POA: Diagnosis not present

## 2020-08-11 DIAGNOSIS — I6529 Occlusion and stenosis of unspecified carotid artery: Secondary | ICD-10-CM | POA: Diagnosis not present

## 2020-08-11 IMAGING — CT CT TEMPORAL BONES W/O CM
2 series · 14 of 40 positions shown, 17 images · non-contrast
Comparison: Angiogram [DATE].  CTA head and neck [DATE].

CLINICAL DATA: 59-year-old male with pulsatile tinnitus.
High-riding right jugular bulb on cerebral angiogram. Query bony
dehiscence.

EXAM:
CT TEMPORAL BONES WITHOUT CONTRAST
TECHNIQUE: Axial and coronal plane CT imaging of the petrous temporal bones was
performed with thin-collimation image reconstruction. No intravenous
contrast was administered. Multiplanar CT image reconstructions were
also generated.

[Series 3: ax soft · axial · 0.35mm/px · z∈[-56,+40]mm · 11 of 56 slices shown, 14 images]
[im 4/56  brain]
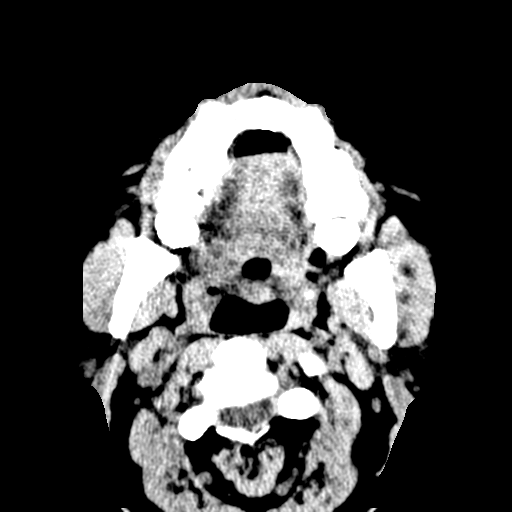
[im 4/56  bone]
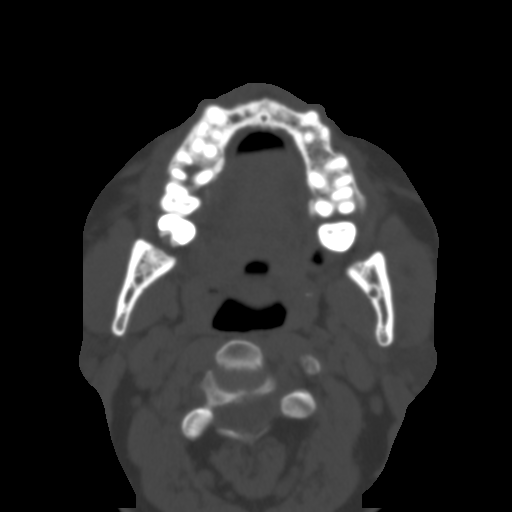
[im 8/56  bone]
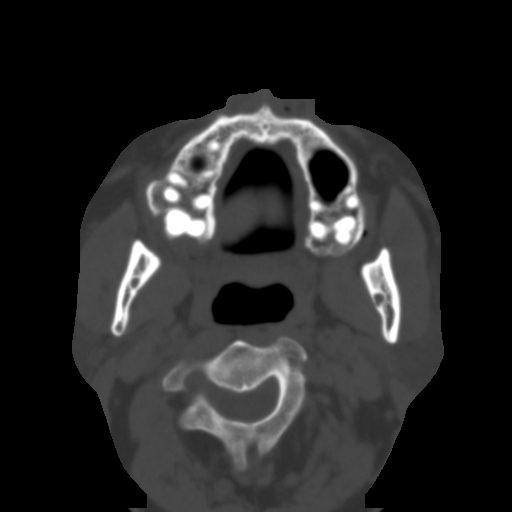
[im 14/56  bone]
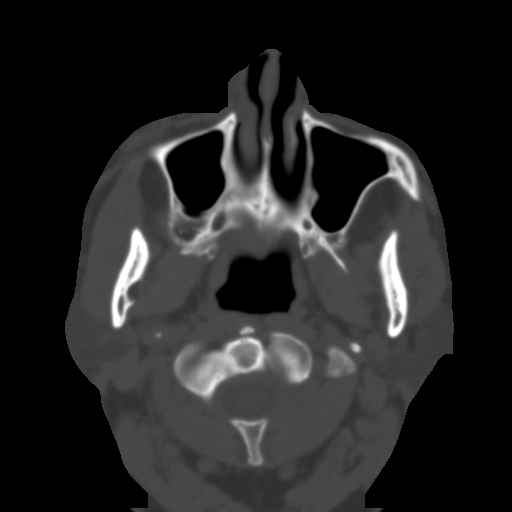
[im 18/56  bone]
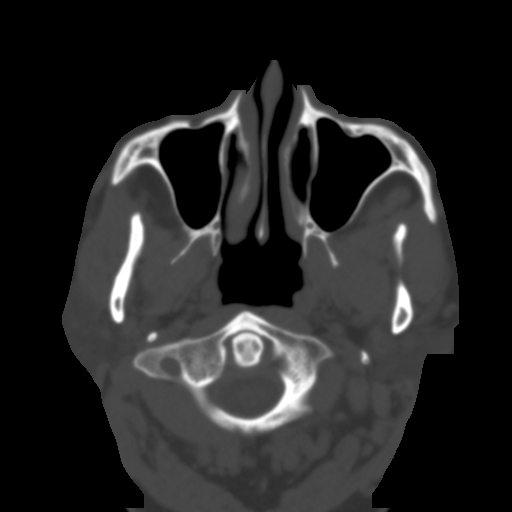
[im 23/56  brain]
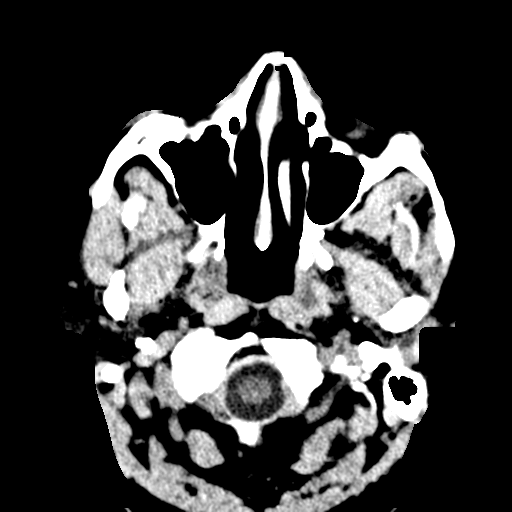
[im 23/56  bone]
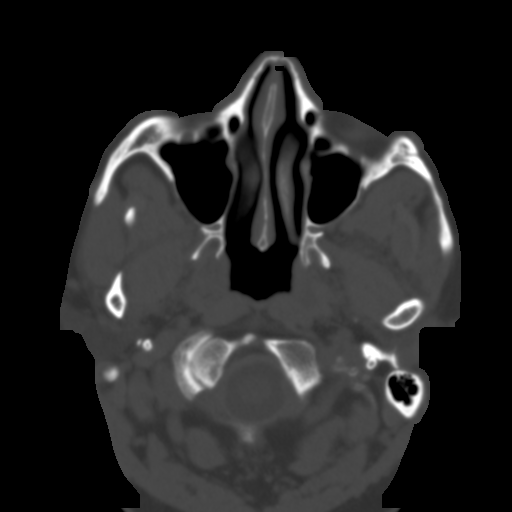
[im 29/56  bone]
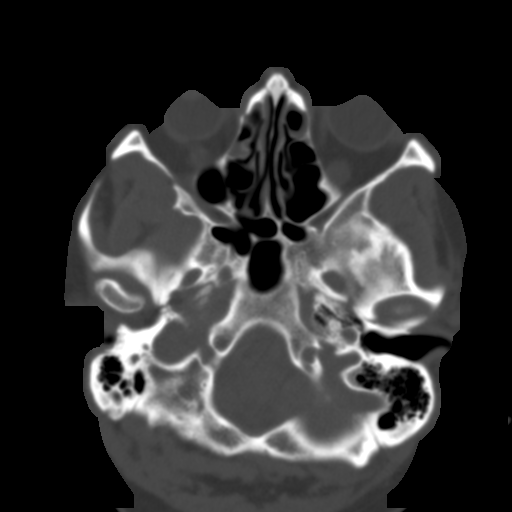
[im 33/56  bone]
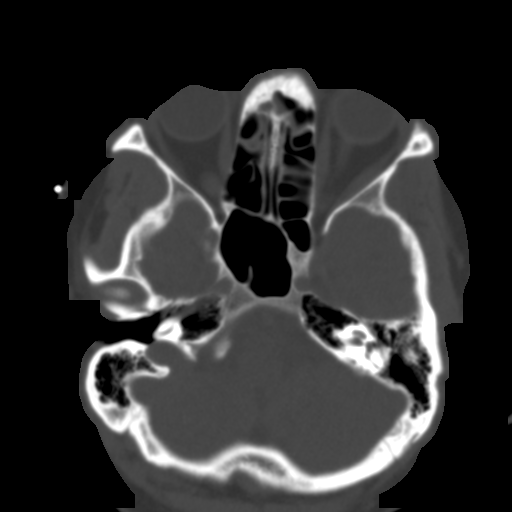
[im 38/56  bone]
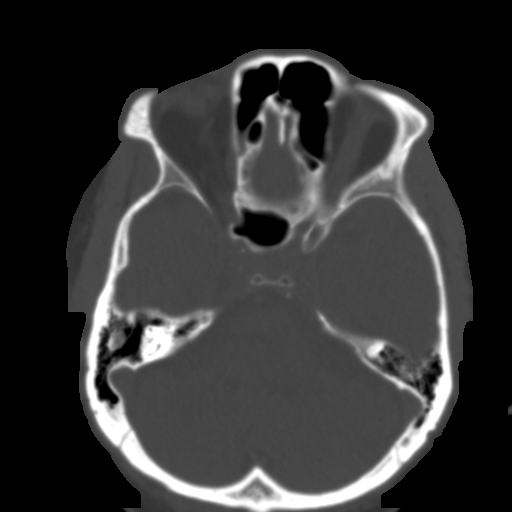
[im 42/56  brain]
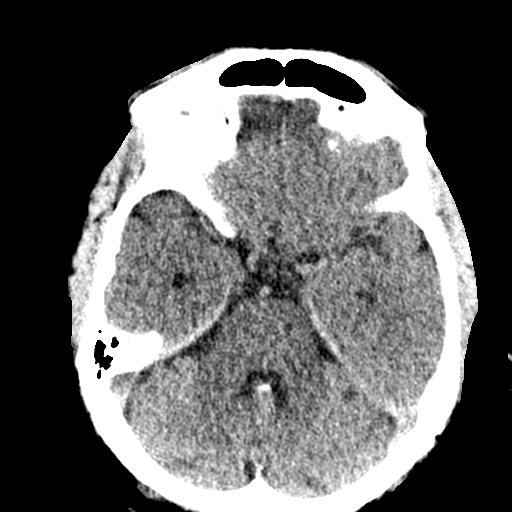
[im 42/56  bone]
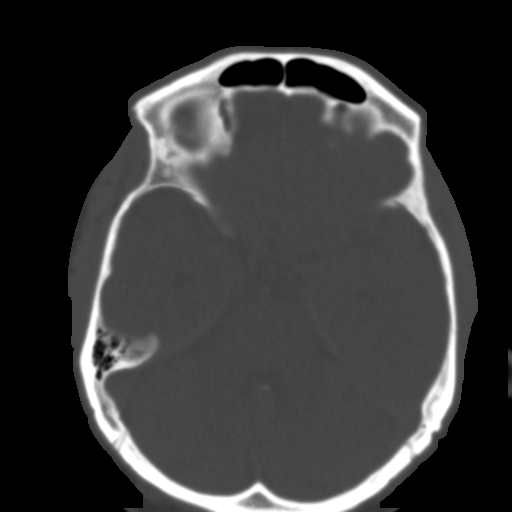
[im 48/56  bone]
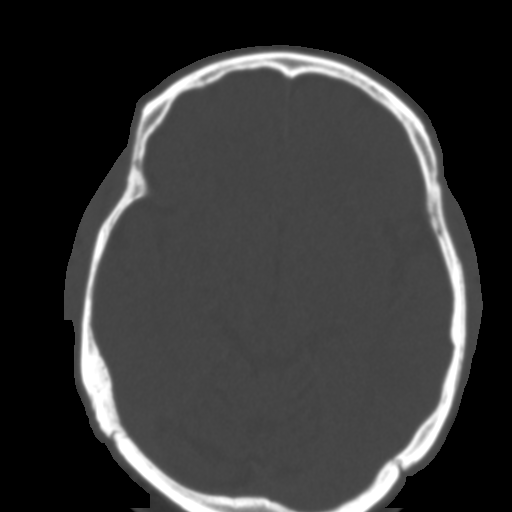
[im 52/56  bone]
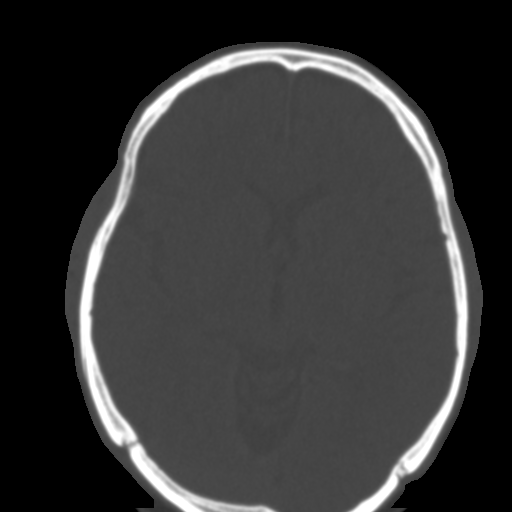

[Series 9: cor soft · coronal · 0.23mm/px · 3 of 87 slices shown]
[im 29/87  bone]
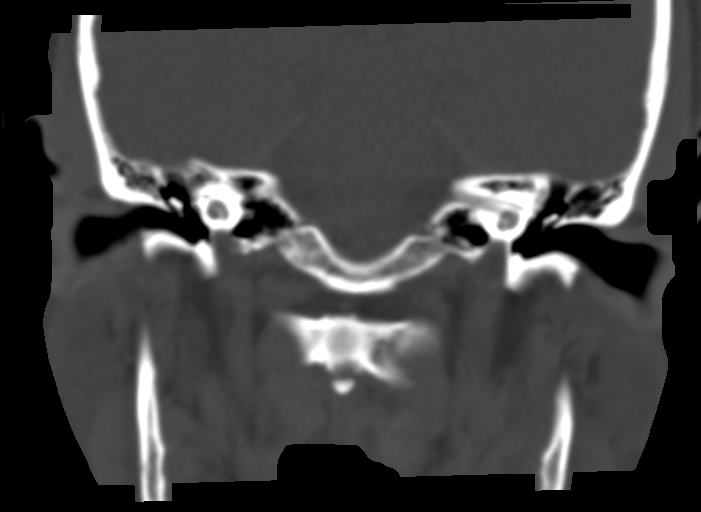
[im 39/87  bone]
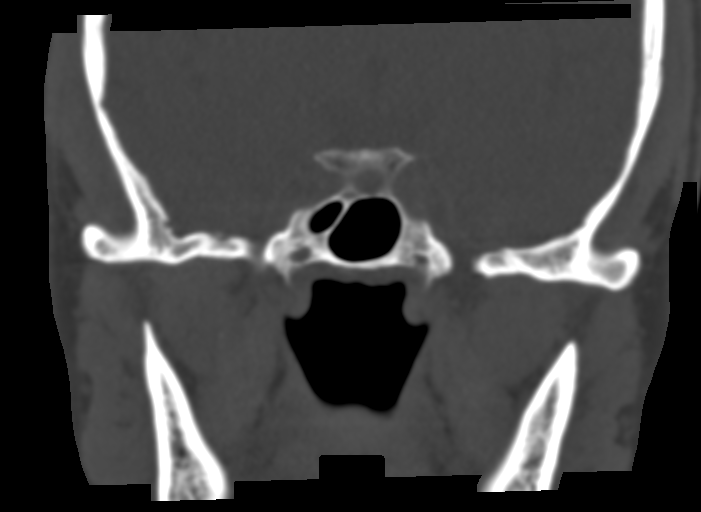
[im 48/87  bone]
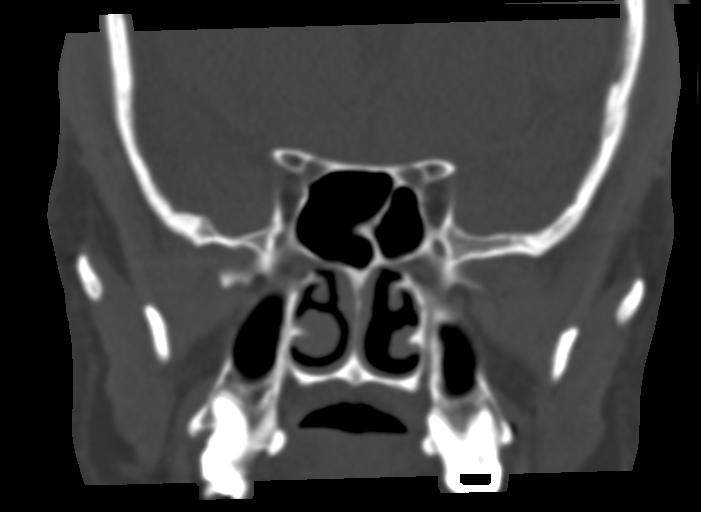

[14 of 40 positions shown; findings below may reference images not displayed]

FINDINGS: Vascular: Mild Calcified atherosclerosis at the skull base.

Limited intracranial: Stable noncontrast CT appearance of the
visible brain parenchyma.

Visible orbits/paranasal sinuses: Visualized orbits and scalp soft
tissues are within normal limits. Mild mucosal thickening at the
right frontoethmoidal recess is stable. Overall paranasal sinuses
remain well aerated. In general skull base bone mineralization is
normal.

Soft tissues: Negative visible noncontrast deep soft tissue spaces
of the face.

RIGHT TEMPORAL BONE

Minimal debris in the superficial aspect of the right external
auditory canal which otherwise appears normal. Normal right tympanic
membrane. The right tympanic cavity is clear. The right scutum and
ossicles are intact. Right mastoid antrum and air cells are clear.

Right petrous apex is pneumatized and appears unremarkable.

High riding right IJ bulb redemonstrated, with thinning of bone at
its interface with the tympanic cavity (series 7, image 105). The
right sigmoid plate is intact.

Right IAC, cochlea, vestibule and vestibular aqueduct are within
normal limits. Right semicircular canals and course of the right 7th
nerve appear normal.

LEFT TEMPORAL BONE

Normal left external auditory canal. Normal left tympanic membrane.
Left tympanic cavity is clear. Left scutum and ossicles are intact.
Left mastoid antrum and air cells are clear. Pneumatized left
petrous apex is unremarkable.

Left IAC, cochlea, vestibule and vestibular aqueduct are within
normal limits. Left semicircular canals and course of the left 7th
nerve appear normal.
IMPRESSION: 1. High riding right IJ bulb with thinning of its bony covering at
the tympanic cavity. See series 7, image 105.
2. Otherwise normal CT appearance of the bilateral temporal bones.

## 2020-08-24 ENCOUNTER — Telehealth (HOSPITAL_COMMUNITY): Payer: Self-pay

## 2020-08-24 NOTE — Telephone Encounter (Signed)
Called to schedule consult, no answer, left vm. AW  

## 2020-08-30 ENCOUNTER — Other Ambulatory Visit (HOSPITAL_COMMUNITY): Payer: Self-pay | Admitting: Interventional Radiology

## 2020-08-30 DIAGNOSIS — H93A1 Pulsatile tinnitus, right ear: Secondary | ICD-10-CM

## 2020-09-08 ENCOUNTER — Ambulatory Visit (HOSPITAL_COMMUNITY)
Admission: RE | Admit: 2020-09-08 | Discharge: 2020-09-08 | Disposition: A | Discharge status: Home / Self Care | Payer: BC Managed Care – PPO | Source: Ambulatory Visit | Attending: Interventional Radiology | Admitting: Interventional Radiology

## 2020-09-08 ENCOUNTER — Other Ambulatory Visit: Payer: Self-pay

## 2020-09-08 DIAGNOSIS — G9389 Other specified disorders of brain: Secondary | ICD-10-CM | POA: Diagnosis not present

## 2020-09-08 DIAGNOSIS — H93A1 Pulsatile tinnitus, right ear: Secondary | ICD-10-CM

## 2020-09-10 HISTORY — PX: IR RADIOLOGIST EVAL & MGMT: IMG5224

## 2020-09-29 ENCOUNTER — Telehealth (HOSPITAL_COMMUNITY): Payer: Self-pay | Admitting: Radiology

## 2020-09-29 ENCOUNTER — Other Ambulatory Visit (HOSPITAL_COMMUNITY): Payer: Self-pay | Admitting: Interventional Radiology

## 2020-09-29 DIAGNOSIS — H93A9 Pulsatile tinnitus, unspecified ear: Secondary | ICD-10-CM

## 2020-09-29 NOTE — Telephone Encounter (Signed)
Returned pt's call to schedule procedure with Deveshwar for pulsatile tinnitus. Left VM for pt to call me back. JM

## 2020-10-01 ENCOUNTER — Other Ambulatory Visit: Payer: Self-pay | Admitting: Student

## 2020-10-01 ENCOUNTER — Encounter (HOSPITAL_COMMUNITY): Payer: Self-pay

## 2020-10-01 ENCOUNTER — Other Ambulatory Visit (HOSPITAL_COMMUNITY): Payer: Self-pay | Admitting: Student

## 2020-10-01 DIAGNOSIS — H9319 Tinnitus, unspecified ear: Secondary | ICD-10-CM

## 2020-10-01 MED ORDER — ASPIRIN EC 81 MG PO TBEC: 81.0000 mg | DELAYED_RELEASE_TABLET | Freq: Every day | ORAL | 11 refills | Status: DC

## 2020-10-01 MED ORDER — CLOPIDOGREL BISULFATE 75 MG PO TABS: 75.0000 mg | ORAL_TABLET | Freq: Every day | ORAL | 1 refills | Status: DC

## 2020-10-06 DIAGNOSIS — Z0001 Encounter for general adult medical examination with abnormal findings: Secondary | ICD-10-CM | POA: Diagnosis not present

## 2020-10-06 DIAGNOSIS — K219 Gastro-esophageal reflux disease without esophagitis: Secondary | ICD-10-CM | POA: Insufficient documentation

## 2020-10-06 DIAGNOSIS — I720 Aneurysm of carotid artery: Secondary | ICD-10-CM | POA: Insufficient documentation

## 2020-10-06 DIAGNOSIS — Z860100 Personal history of colon polyps, unspecified: Secondary | ICD-10-CM | POA: Insufficient documentation

## 2020-10-06 DIAGNOSIS — R7303 Prediabetes: Secondary | ICD-10-CM | POA: Insufficient documentation

## 2020-10-06 DIAGNOSIS — J309 Allergic rhinitis, unspecified: Secondary | ICD-10-CM | POA: Insufficient documentation

## 2020-10-08 ENCOUNTER — Other Ambulatory Visit (HOSPITAL_COMMUNITY): Payer: BC Managed Care – PPO

## 2020-10-13 ENCOUNTER — Encounter (HOSPITAL_COMMUNITY): Payer: Self-pay | Admitting: Interventional Radiology

## 2020-10-14 ENCOUNTER — Other Ambulatory Visit (HOSPITAL_COMMUNITY): Payer: Self-pay

## 2020-10-14 ENCOUNTER — Other Ambulatory Visit (HOSPITAL_COMMUNITY)
Admission: RE | Admit: 2020-10-14 | Discharge: 2020-10-14 | Disposition: A | Discharge status: Home / Self Care | Payer: BC Managed Care – PPO | Source: Ambulatory Visit | Attending: Interventional Radiology | Admitting: Interventional Radiology

## 2020-10-14 ENCOUNTER — Other Ambulatory Visit: Payer: Self-pay | Admitting: Radiology

## 2020-10-14 ENCOUNTER — Other Ambulatory Visit: Payer: Self-pay

## 2020-10-14 DIAGNOSIS — H93A9 Pulsatile tinnitus, unspecified ear: Secondary | ICD-10-CM

## 2020-10-14 DIAGNOSIS — Z01812 Encounter for preprocedural laboratory examination: Secondary | ICD-10-CM | POA: Insufficient documentation

## 2020-10-14 DIAGNOSIS — Z20822 Contact with and (suspected) exposure to covid-19: Secondary | ICD-10-CM | POA: Diagnosis not present

## 2020-10-14 LAB — SARS CORONAVIRUS 2 (TAT 6-24 HRS): SARS Coronavirus 2: NEGATIVE

## 2020-10-15 ENCOUNTER — Other Ambulatory Visit: Payer: Self-pay

## 2020-10-15 ENCOUNTER — Encounter (HOSPITAL_COMMUNITY): Payer: Self-pay | Admitting: Interventional Radiology

## 2020-10-15 ENCOUNTER — Other Ambulatory Visit: Payer: Self-pay | Admitting: Physician Assistant

## 2020-10-15 LAB — PLATELET INHIBITION P2Y12: Platelet Function  P2Y12: 108 [PRU] — ABNORMAL LOW (ref 182–335)

## 2020-10-15 NOTE — Progress Notes (Signed)
PCP - Dr. Nevada Crane Cardiologist - denies EKG -  Chest x-ray -  ECHO -  Cardiac Cath -  CPAP -   Blood Thinner Instructions: per MD instructions per pt - take ASA and Plavix on DOS  Aspirin Instructions:   ERAS Protcol - no  COVID TEST- negative 7/21  Anesthesia review: n/a  -------------  SDW INSTRUCTIONS:  Your procedure is scheduled on Monday 7/25. Please report to [facility] Main Entrance "A" at 0600 A.M., and check in at the Admitting office. Call this number if you have problems the morning of surgery: [phone]   Remember: Do not eat or drink after midnight the night before your surgery   Medications to take morning of surgery with a sip of water include: pantoprazole (PROTONIX) ASA Plavix  As of today, STOP taking any Aleve, Naproxen, Ibuprofen, Motrin, Advil, Goody's, BC's, all herbal medications, fish oil, and all vitamins.    The Morning of Surgery Do not wear jewelry Do not wear lotions, powders, colognes, or deodorant Men may shave face and neck. Do not bring valuables to the hospital. [facility] is not responsible for any belongings or valuables.  If you are a smoker, DO NOT Smoke 24 hours prior to surgery  If you wear a CPAP at night please bring your mask the morning of surgery   Remember that you must have someone to transport you home after your surgery, and remain with you for 24 hours if you are discharged the same day.  Please bring cases for contacts, glasses, hearing aids, dentures or bridgework because it cannot be worn into surgery.   Patients discharged the day of surgery will not be allowed to drive home.   Please shower the NIGHT BEFORE/MORNING OF SURGERY (use antibacterial soap like DIAL soap if possible). Wear comfortable clothes the morning of surgery. Oral Hygiene is also important to reduce your risk of infection.  Remember - BRUSH YOUR TEETH THE MORNING OF SURGERY WITH YOUR REGULAR TOOTHPASTE  Patient denies shortness of breath,  fever, cough and chest pain.

## 2020-10-15 NOTE — Progress Notes (Signed)
Attempted to notify [employer] rep for pt surgery 7/25, have not received call back. Rep was paged from [phone]. Joey was not available. Will continue to call back

## 2020-10-17 NOTE — Anesthesia Preprocedure Evaluation (Addendum)
Anesthesia Evaluation  Patient identified by MRN, date of birth, ID band Patient awake    Reviewed: Allergy & Precautions, NPO status , Patient's Chart, lab work & pertinent test results  Airway Mallampati: II  TM Distance: >3 FB Neck ROM: Full    Dental no notable dental hx.    Pulmonary former smoker,    Pulmonary exam normal breath sounds clear to auscultation       Cardiovascular negative cardio ROS Normal cardiovascular exam Rhythm:Regular Rate:Normal     Neuro/Psych negative psych ROS   GI/Hepatic negative GI ROS, Neg liver ROS,   Endo/Other  negative endocrine ROS  Renal/GU negative Renal ROS     Musculoskeletal negative musculoskeletal ROS (+)   Abdominal   Peds  Hematology HLD   Anesthesia Other Findings PULSATILE TINNITUS  Reproductive/Obstetrics                            Anesthesia Physical Anesthesia Plan  ASA: 2  Anesthesia Plan: MAC   Post-op Pain Management:    Induction: Intravenous  PONV Risk Score and Plan: 1 and Ondansetron, Dexamethasone, Propofol infusion, Treatment may vary due to age or medical condition and Midazolam  Airway Management Planned: Simple Face Mask  Additional Equipment:   Intra-op Plan:   Post-operative Plan:   Informed Consent: I have reviewed the patients History and Physical, chart, labs and discussed the procedure including the risks, benefits and alternatives for the proposed anesthesia with the patient or authorized representative who has indicated his/her understanding and acceptance.     Dental advisory given  Plan Discussed with: CRNA  Anesthesia Plan Comments:         Anesthesia Quick Evaluation

## 2020-10-18 ENCOUNTER — Ambulatory Visit (HOSPITAL_COMMUNITY): Payer: BC Managed Care – PPO | Admitting: Anesthesiology

## 2020-10-18 ENCOUNTER — Ambulatory Visit (HOSPITAL_COMMUNITY)
Admission: RE | Admit: 2020-10-18 | Discharge: 2020-10-18 | Disposition: A | Discharge status: Home / Self Care | Payer: BC Managed Care – PPO | Source: Ambulatory Visit | Attending: Interventional Radiology | Admitting: Interventional Radiology

## 2020-10-18 ENCOUNTER — Encounter (HOSPITAL_COMMUNITY)
Admission: AD | Disposition: A | Discharge status: Home / Self Care | Payer: Self-pay | Source: Ambulatory Visit | Attending: Interventional Radiology

## 2020-10-18 ENCOUNTER — Other Ambulatory Visit: Payer: Self-pay

## 2020-10-18 ENCOUNTER — Encounter: Payer: Self-pay | Admitting: Radiology

## 2020-10-18 ENCOUNTER — Observation Stay (HOSPITAL_COMMUNITY)
Admission: AD | Admit: 2020-10-18 | Discharge: 2020-10-19 | Disposition: A | Discharge status: Home / Self Care | Payer: BC Managed Care – PPO | Source: Ambulatory Visit | Attending: Interventional Radiology | Admitting: Interventional Radiology

## 2020-10-18 ENCOUNTER — Encounter (HOSPITAL_COMMUNITY): Payer: Self-pay | Admitting: Interventional Radiology

## 2020-10-18 DIAGNOSIS — E7800 Pure hypercholesterolemia, unspecified: Secondary | ICD-10-CM | POA: Diagnosis not present

## 2020-10-18 DIAGNOSIS — Z79899 Other long term (current) drug therapy: Secondary | ICD-10-CM | POA: Insufficient documentation

## 2020-10-18 DIAGNOSIS — Z7902 Long term (current) use of antithrombotics/antiplatelets: Secondary | ICD-10-CM | POA: Insufficient documentation

## 2020-10-18 DIAGNOSIS — H9311 Tinnitus, right ear: Principal | ICD-10-CM

## 2020-10-18 DIAGNOSIS — H93A9 Pulsatile tinnitus, unspecified ear: Secondary | ICD-10-CM

## 2020-10-18 DIAGNOSIS — H93A1 Pulsatile tinnitus, right ear: Secondary | ICD-10-CM | POA: Diagnosis present

## 2020-10-18 DIAGNOSIS — G9389 Other specified disorders of brain: Secondary | ICD-10-CM | POA: Diagnosis not present

## 2020-10-18 DIAGNOSIS — Z7982 Long term (current) use of aspirin: Secondary | ICD-10-CM | POA: Diagnosis not present

## 2020-10-18 DIAGNOSIS — Z87891 Personal history of nicotine dependence: Secondary | ICD-10-CM | POA: Insufficient documentation

## 2020-10-18 HISTORY — PX: IR CT HEAD LTD: IMG2386

## 2020-10-18 HISTORY — PX: IR US GUIDE VASC ACCESS RIGHT: IMG2390

## 2020-10-18 HISTORY — PX: IR ANGIO INTRA EXTRACRAN SEL COM CAROTID INNOMINATE UNI R MOD SED: IMG5359

## 2020-10-18 HISTORY — PX: RADIOLOGY WITH ANESTHESIA: SHX6223

## 2020-10-18 HISTORY — PX: IR TRANSCATH PLC STENT  INITIAL VEIN  INC ANGIOPLASTY: IMG5445

## 2020-10-18 HISTORY — PX: IR VENO/JUGULAR RIGHT: IMG2275

## 2020-10-18 HISTORY — DX: Tinnitus, right ear: H93.11

## 2020-10-18 LAB — CBC
HCT: 40.5 % (ref 39.0–52.0)
Hemoglobin: 12.9 g/dL — ABNORMAL LOW (ref 13.0–17.0)
MCH: 27 pg (ref 26.0–34.0)
MCHC: 31.9 g/dL (ref 30.0–36.0)
MCV: 84.9 fL (ref 80.0–100.0)
Platelets: 218 10*3/uL (ref 150–400)
RBC: 4.77 MIL/uL (ref 4.22–5.81)
RDW: 14.8 % (ref 11.5–15.5)
WBC: 5.2 10*3/uL (ref 4.0–10.5)
nRBC: 0 % (ref 0.0–0.2)

## 2020-10-18 LAB — BASIC METABOLIC PANEL
Anion gap: 7 (ref 5–15)
BUN: 13 mg/dL (ref 6–20)
CO2: 26 mmol/L (ref 22–32)
Calcium: 8.8 mg/dL — ABNORMAL LOW (ref 8.9–10.3)
Chloride: 106 mmol/L (ref 98–111)
Creatinine, Ser: 1.01 mg/dL (ref 0.61–1.24)
GFR, Estimated: >60 mL/min (ref >60)
Glucose, Bld: 136 mg/dL — ABNORMAL HIGH (ref 70–99)
Potassium: 4 mmol/L (ref 3.5–5.1)
Sodium: 139 mmol/L (ref 135–145)

## 2020-10-18 LAB — URINALYSIS, COMPLETE (UACMP) WITH MICROSCOPIC
Bacteria, UA: NONE SEEN
Bilirubin Urine: NEGATIVE
Glucose, UA: NEGATIVE mg/dL
Hgb urine dipstick: NEGATIVE
Ketones, ur: NEGATIVE mg/dL
Leukocytes,Ua: NEGATIVE
Nitrite: NEGATIVE
Protein, ur: NEGATIVE mg/dL
Specific Gravity, Urine: 1.024 (ref 1.005–1.030)
pH: 5 (ref 5.0–8.0)

## 2020-10-18 LAB — APTT: aPTT: 25 seconds (ref 24–36)

## 2020-10-18 LAB — POCT ACTIVATED CLOTTING TIME: Activated Clotting Time: 242 seconds

## 2020-10-18 LAB — HEPARIN LEVEL (UNFRACTIONATED): Heparin Unfractionated: 0.58 IU/mL (ref 0.30–0.70)

## 2020-10-18 LAB — PROTIME-INR
INR: 0.9 (ref 0.8–1.2)
Prothrombin Time: 12.3 seconds (ref 11.4–15.2)

## 2020-10-18 LAB — MRSA NEXT GEN BY PCR, NASAL: MRSA by PCR Next Gen: NOT DETECTED

## 2020-10-18 IMAGING — XA IR TRANSCATH PLC STENT  INITIAL VEIN  INC ANGIOPLASTY
9 of 23 series · 9 of 24 positions shown · IV contrast (IODINE)
Comparison: Previous arteriogram.

CLINICAL DATA: Symptomatic right-sided pulsatile tinnitus secondary
to a large right internal jugular bulb associated with partial
dehiscence of the right jugular canal at the level of the temporal
bones.

EXAM:
IR TRANSCATH IONUT STENT INITIAL VEIN  INC ANGIOPLASTY
TECHNIQUE: Informed written consent was obtained from the patient after a
thorough discussion of the procedural risks, benefits and
alternatives. All questions were addressed. Maximal Sterile Barrier
Technique was utilized including caps, mask, sterile gowns, sterile
gloves, sterile drape, hand hygiene and skin antiseptic. A timeout
was performed prior to the initiation of the procedure.

[Series 1: ir (id) (id)/(id) · 1 of 2 slices shown]
[im 1/2]
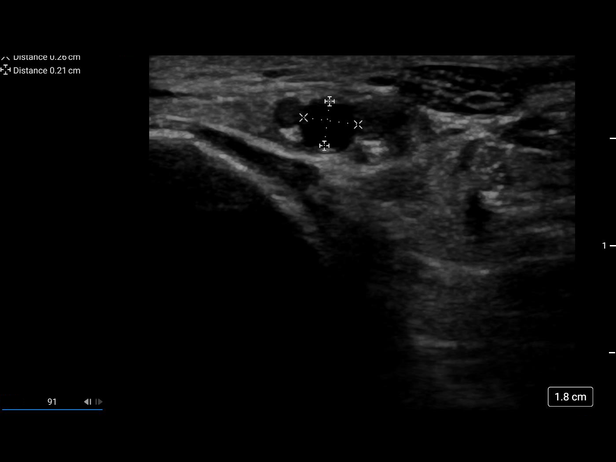

[Series 4: cerebral · 2 acquisitions, 1 frame shown (1 of 8)]
[im 1/2]
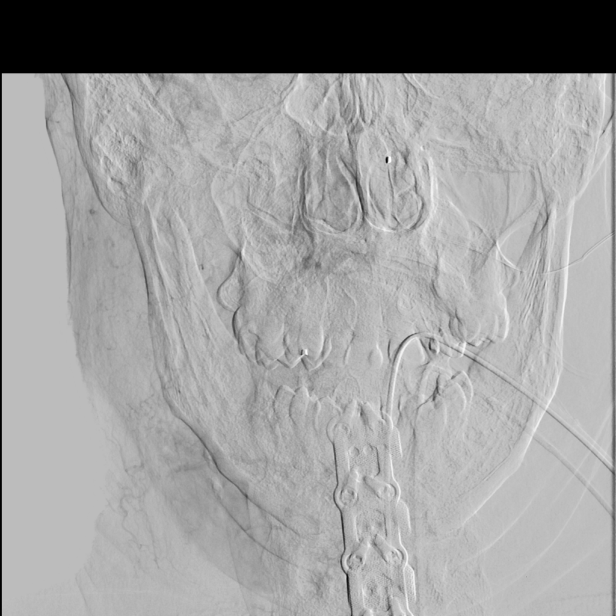

[Series 7: cerebral · 2 acquisitions, 1 frame shown (2 of 8)]
[im 1/2]
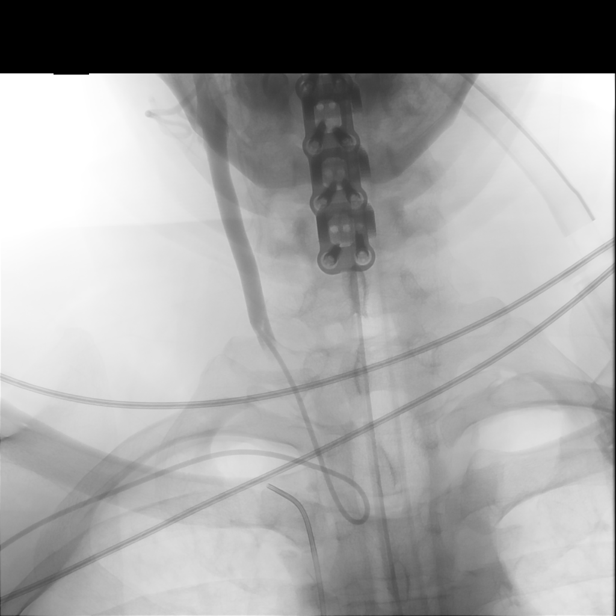

[Series 10: cerebral · 2 acquisitions, 1 frame shown (3 of 8)]
[im 1/2]
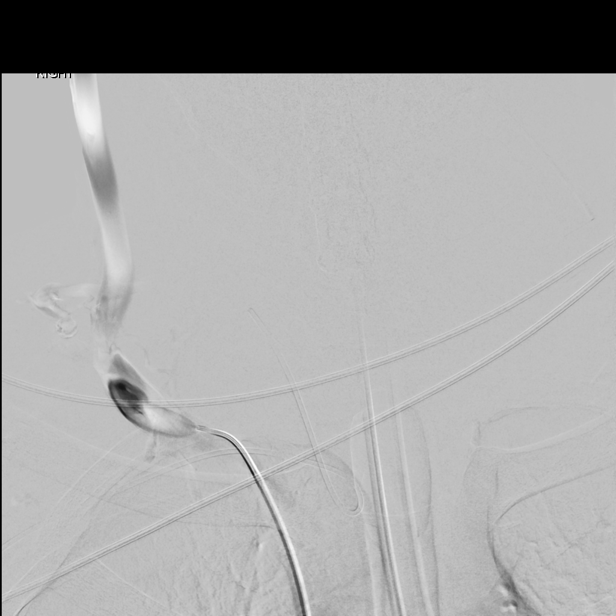

[Series 12: cerebral · 2 acquisitions, 1 frame shown (4 of 8)]
[im 1/2]
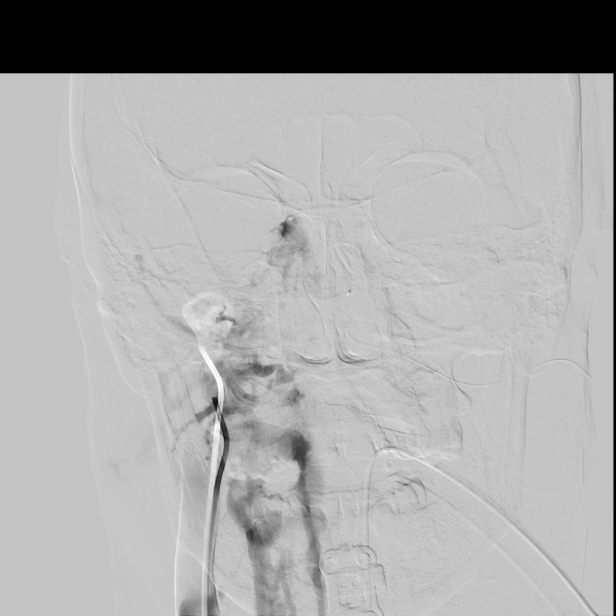

[Series 14: cerebral · 2 acquisitions, 1 frame shown (5 of 8)]
[im 1/2]
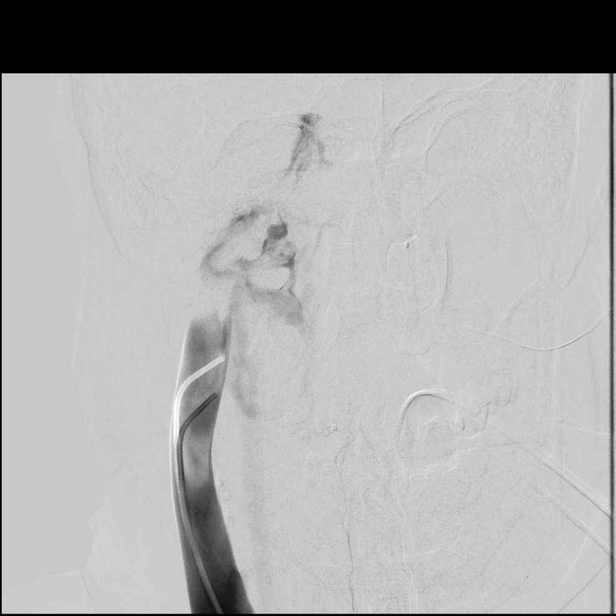

[Series 17: cerebral · 2 acquisitions, 1 frame shown (6 of 8)]
[im 1/2]
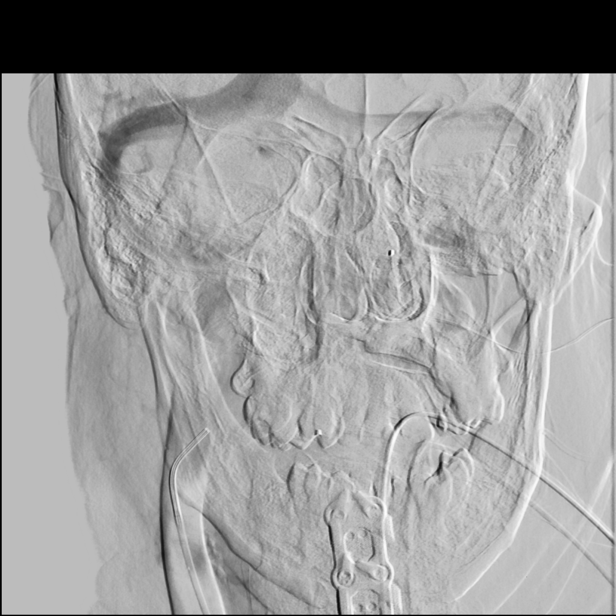

[Series 21: cerebral · 2 acquisitions, 1 frame shown (7 of 8)]
[im 1/2]
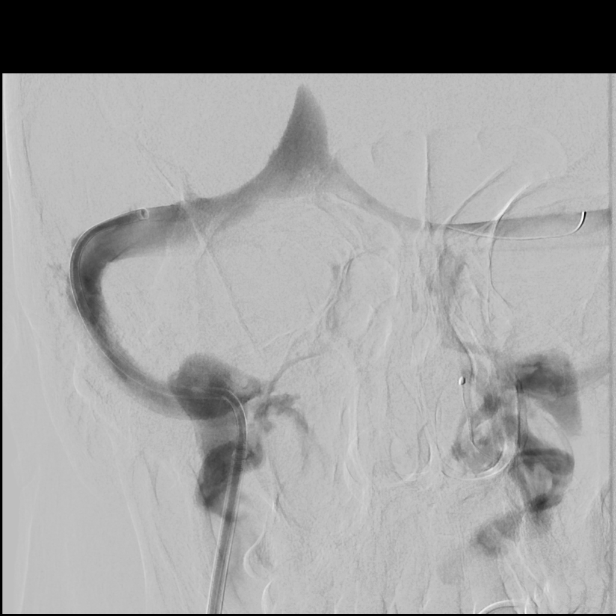

[Series 23: cerebral · 2 acquisitions, 1 frame shown (8 of 8)]
[im 1/2]
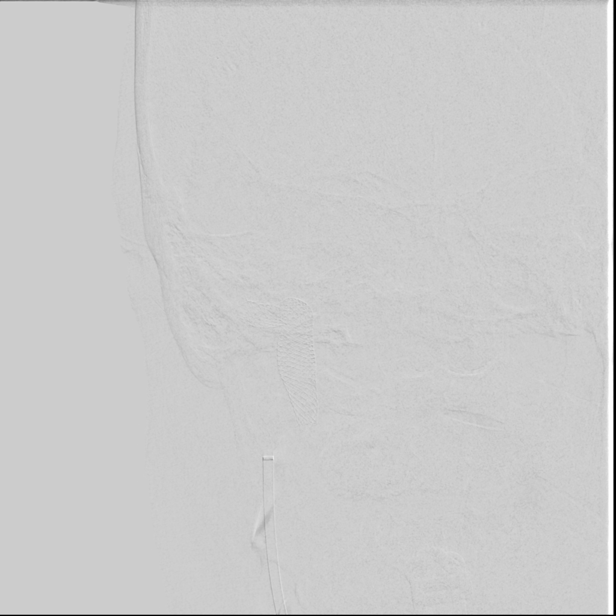

[9 of 24 positions shown; findings below may reference images not displayed]

MEDICATIONS:
Heparin 4,000 units IV. Ancef 2 g IV antibiotic was administered
within 1 hour of the procedure.

ANESTHESIA/SEDATION:
General anesthesia.

CONTRAST:  Omnipaque 300 approximately 200 mL.

FLUOROSCOPY TIME:  Fluoroscopy Time: 95 minutes 4 seconds ([YD]
mGy).

COMPLICATIONS:
None immediate.
The right forearm was prepped and draped in the usual sterile
manner. The right radial artery was then identified with ultrasound,
and its morphology documented. A dorsal palmar anastomosis was
verified to be present. Using ultrasound guidance, transradial
access was obtained with a micropuncture set over a 0.018 inch micro
guidewire. A [DATE] French radial sheath was then inserted without
event. The obturator, and the micro guidewire were removed. Good
aspiration was obtained from the side port of the radial sheath. A
cocktail of [YD] units of heparin, 2.5 mg of verapamil, and 200 mcg
of nitroglycerin was then infused in diluted form through the radial
sheath without event.

IONUT 2 5 French diagnostic catheter was then advanced to the
aortic arch region, and selectively advanced to the right common
carotid artery. The guidewire was removed. Good aspiration obtained
from the hub of the 5 French diagnostic IONUT 2 catheter. A
control arteriogram was then perfor[REDACTED]ed over the right
common carotid bifurcation and intracranially. This was then
connected to continuous heparinized saline infusion.

The right groin was prepped and draped in the usual sterile fashion.
Thereafter using modified Seldinger technique, transfemoral venous
access into the right common femoral vein was obtained without
difficulty. Over a 0.035 inch guidewire, a 5 French Pinnacle sheath
was inserted. Through this, and also over 0.035 inch guidewire, a 5
French JB 1 catheter was advanced under constant fluoroscopic
guidance into the inferior vena cava, through the right atrium and
into the superior vena cava. Eventually access was obtained into the
right internal jugular vein just below the cranial skull base. The
guidewire was removed. Good aspiration obtained from the hub of the
5 French JB 1 catheter. A gentle control arteriogram was performed
through the diagnostic catheter.
FINDINGS: The right common carotid arteriogram demonstrates the right external
carotid artery and its major branches to be widely patent.

Right internal carotid artery at the bulb to the cranial skull base
is widely patent.

The petrous, cavernous and the supraclinoid segments demonstrate
wide patency.

The right middle cerebral artery and the right anterior cerebral
artery opacify into the capillary and venous phases.

The delayed venous phase demonstrates multiple venous channels in
the right suboccipital region with delayed opacification of the
right internal jugular vein.

A diagnostic arteriogram obtained through the diagnostic catheter in
the right internal jugular vein demonstrates again multiple sub
occipital venous channels, associated with a severe stenosis of the
right internal jugular vein just proximal to the numerous venous
channels.

ENDOVASCULAR EMBOLIZATION OF THE LARGE HIGH-RIDING RIGHT JUGULAR
BULB, AND THE SEVERE STENOSIS OF THE INTERNAL JUGULAR VEIN AT THE
SKULL BASE.

Over a 0.035 inch 300 cm Rosen exchange guidewire, an 8 French 95 cm
Neuron Max sheath was advanced to the right internal jugular vein at
the skull base. Good aspiration obtained from the hub of the 8
French Neuron Max sheath. A gentle control arteriogram demonstrated
the numerous venous channels, associated with the high grade
stenosis of the right internal jugular vein as described above.

Over a 0.014 inch standard glide catheter, a 105 cm 6 French
Benchmark catheter was advanced distal to the stenosis and
subsequently advanced into the right sigmoid sinus transverse sinus
junction. The guidewire was removed. Good aspiration obtained from
the hub of the Benchmark guide catheter. A venogram was then
performed through the Benchmark guide sheath with opacification of
the right transverse sinus partially the torcula, superior sagittal
sinus and the left transverse sinus, left sigmoid sinus and into the
left internal jugular vein.

An 021 microcatheter was then advanced over a 0.014 inch 300 cm BMW
micro guidewire with a J-tip configuration to the distal end of the
Benchmark guide catheter in the right transverse sinus. The
microcatheter and the exchange guidewire were then advanced without
difficulty to the left sigmoid sinus.

The microcatheter was removed with the exchange wire being
maintained in the left sigmoid sinus.

Measurements were then performed of the right sigmoid sinus just
proximal to the right sigmoid sinus transverse sinus junction, and
the right internal jugular vein at the skull base. A 10 mm x 37 mm
Wallstent was chosen to embolize the large internal jugular bulb,
and to extend into the severely stenotic right internal jugular
vein.

The stent delivery system could not be advanced through the 078
Benchmark catheter. The 8 French Neuron Max sheath was then advanced
without difficulty to the right transverse sinus sigmoid sinus
junction. The Benchmark catheter was removed. Over the 014 inch BMW
exchange micro guidewire, the stent delivery apparatus was then
advanced without difficulty. The stent markers were then positioned
distally and proximally at the chosen landing zones.

Thereafter with slight forward gentle traction with the right hand
on the pusher of the Wallstent, the delivery catheter was then
gently retrieved unsheathing first the distal and then the proximal
portion of the stent. A control arteriogram performed through the 8
French Neuron Max sheath just proximal to the proximal portion of
stent demonstrated excellent opacification of the now widely patent
right internal jugular vein, and stasis in the large jugular bulb
with the distal portion of the stent opposing to the sigmoid sinus
distal to the jugular bulb.

However, significant stenosis was noted at the right internal
jugular vein and jugular bulb complex. This necessitated the
advancement of an 8 mm x 60 mm IONUT balloon angioplasty catheter
which had been prepped with 50% contrast and 50% heparinized saline
infusion through the reintroduced Benchmark guide catheter which was
advanced to just inside the stent.

Control inflation was then performed using micro inflation syringe
device via micro tubing with the balloon inflated to approximately
7.88 atmospheres where it was maintained for approximately 20
seconds. The balloon was then deflated and retrieved and removed
whilst distal wire access was obtained in the left transverse sinus.

A control venogram performed through the Benchmark guide catheter
demonstrated excellent apposition and expansion of the entire stent
at the jugular bulb internal jugular vein junction. Also noted was
wide patency of the previously severely narrowed right internal
jugular vein.

Control arteriograms through the right common carotid artery into
the delayed venous phase now demonstrated brisk access of the dural
venous sinuses bilaterally and into the right internal jugular vein.

A final control arteriogram performed through the right common
carotid artery extra cranially and intracranially demonstrated no
evidence of intraluminal filling defects or of occlusions
intracranially. The diagnostic 5 French catheter was removed. A
wrist band was applied for hemostasis at the right radial artery
puncture site. Distal radial pulse was verified to be present.

The 8 French Neuron Max sheath was then removed with hemostasis
achieved right common femoral venous puncture site with manual
compression.

Distal pulses in both feet remained intact. Postprocedure CT of the
brain demonstrated no evidence of intracranial hemorrhages or mass
effect.

Patient was then extubated without difficulty. Upon recovery, the
patient complained of mild to moderate right mastoid region pain,
but without any headaches, nausea, vomiting, or visual or complaints
of neck stiffness.

He was then transferred to the PACU and then neuro ICU for post
treatment neurologic observations, control of his blood pressure,
and with low-dose IV heparin, overnight the patient remained stable
neurologically and was able to handle liquids and solids without
difficulty.

The following morning, the patient remained neurologically intact
with minimal pain in the right mastoid region.

He had no significant neurological complaints.

He was then ambulated with assistance and subsequently discharged
home under the care of his wife.

Patient was instructed to continue with aspirin 81 mg a day, and
Plavix 75 mg a day. Patient was also advised to maintain adequate
hydration by drinking water. He was advised to refrain from driving
for 2 weeks. Also patient was advised to refrain from stooping,
bending or lifting weights above 10 pounds for 2 weeks.
IMPRESSION: Status post endovascular embolization of large symptomatic right
internal jugular bulb with placement of a Wallstent extending from
the right mid sigmoid sinus and into the right internal jugular vein
at the skull base.

Status post angioplasty of severely stenotic right internal jugular
vein at the skull base as described above.

PLAN:
Follow-up in the clinic 2 weeks post discharge.

## 2020-10-18 SURGERY — IR WITH ANESTHESIA
Anesthesia: Monitor Anesthesia Care

## 2020-10-18 MED ORDER — VERAPAMIL HCL 2.5 MG/ML IV SOLN: INTRA_ARTERIAL | Status: AC | PRN

## 2020-10-18 MED ORDER — OXYCODONE HCL 5 MG/5ML PO SOLN
5.0000 mg | Freq: Once | ORAL | Status: DC | PRN
Start: 2020-10-18 — End: 2020-10-18

## 2020-10-18 MED ORDER — LIDOCAINE 2% (20 MG/ML) 5 ML SYRINGE
INTRAMUSCULAR | Status: DC | PRN
  Administered 2020-10-18: 60 mg via INTRAVENOUS

## 2020-10-18 MED ORDER — HEPARIN SODIUM (PORCINE) 1000 UNIT/ML IJ SOLN
INTRAMUSCULAR | Status: AC
  Filled 2020-10-18: qty 1

## 2020-10-18 MED ORDER — CLEVIDIPINE BUTYRATE 0.5 MG/ML IV EMUL
0.0000 mg/h | INTRAVENOUS | Status: DC
  Administered 2020-10-18: 6 mg/h via INTRAVENOUS

## 2020-10-18 MED ORDER — CLOPIDOGREL BISULFATE 75 MG PO TABS: 75.0000 mg | ORAL_TABLET | Freq: Every day | ORAL | Status: DC

## 2020-10-18 MED ORDER — CLOPIDOGREL BISULFATE 75 MG PO TABS
75.0000 mg | ORAL_TABLET | Freq: Every day | ORAL | Status: DC
  Administered 2020-10-19: 75 mg via ORAL
  Filled 2020-10-18: qty 1

## 2020-10-18 MED ORDER — VERAPAMIL HCL 2.5 MG/ML IV SOLN
INTRAVENOUS | Status: AC
  Filled 2020-10-18: qty 2

## 2020-10-18 MED ORDER — ACETAMINOPHEN 325 MG PO TABS
650.0000 mg | ORAL_TABLET | ORAL | Status: DC | PRN
  Administered 2020-10-18: 650 mg via ORAL
  Filled 2020-10-18: qty 2

## 2020-10-18 MED ORDER — ACETAMINOPHEN 160 MG/5ML PO SOLN: 650.0000 mg | ORAL | Status: DC | PRN

## 2020-10-18 MED ORDER — PHENYLEPHRINE HCL-NACL 10-0.9 MG/250ML-% IV SOLN
INTRAVENOUS | Status: DC | PRN
  Administered 2020-10-18: 50 ug/min via INTRAVENOUS

## 2020-10-18 MED ORDER — AMISULPRIDE (ANTIEMETIC) 5 MG/2ML IV SOLN: 10.0000 mg | Freq: Once | INTRAVENOUS | Status: DC | PRN

## 2020-10-18 MED ORDER — HEPARIN (PORCINE) 25000 UT/250ML-% IV SOLN
INTRAVENOUS | Status: AC
  Filled 2020-10-18: qty 250

## 2020-10-18 MED ORDER — EPHEDRINE SULFATE 50 MG/ML IJ SOLN
INTRAMUSCULAR | Status: DC | PRN
  Administered 2020-10-18: 5 mg via INTRAVENOUS

## 2020-10-18 MED ORDER — LIDOCAINE HCL 1 % IJ SOLN
INTRAMUSCULAR | Status: AC
  Filled 2020-10-18: qty 20

## 2020-10-18 MED ORDER — ACETAMINOPHEN 10 MG/ML IV SOLN
1000.0000 mg | Freq: Once | INTRAVENOUS | Status: DC | PRN
  Administered 2020-10-18: 1000 mg via INTRAVENOUS

## 2020-10-18 MED ORDER — CEFAZOLIN SODIUM-DEXTROSE 2-4 GM/100ML-% IV SOLN
INTRAVENOUS | Status: AC
  Filled 2020-10-18: qty 100

## 2020-10-18 MED ORDER — ORAL CARE MOUTH RINSE: 15.0000 mL | Freq: Once | OROMUCOSAL | Status: AC

## 2020-10-18 MED ORDER — CLEVIDIPINE BUTYRATE 0.5 MG/ML IV EMUL
INTRAVENOUS | Status: AC
  Filled 2020-10-18: qty 50

## 2020-10-18 MED ORDER — CEFAZOLIN SODIUM-DEXTROSE 2-4 GM/100ML-% IV SOLN
2.0000 g | INTRAVENOUS | Status: AC
  Administered 2020-10-18: 2 g via INTRAVENOUS

## 2020-10-18 MED ORDER — ASPIRIN 81 MG PO CHEW
CHEWABLE_TABLET | ORAL | Status: AC
  Filled 2020-10-18: qty 3

## 2020-10-18 MED ORDER — NIMODIPINE 30 MG PO CAPS
0.0000 mg | ORAL_CAPSULE | ORAL | Status: DC
  Filled 2020-10-18: qty 2

## 2020-10-18 MED ORDER — SODIUM CHLORIDE 0.9 % IV SOLN: INTRAVENOUS | Status: DC

## 2020-10-18 MED ORDER — FENTANYL CITRATE (PF) 100 MCG/2ML IJ SOLN
INTRAMUSCULAR | Status: DC | PRN
  Administered 2020-10-18 (×2): 50 ug via INTRAVENOUS

## 2020-10-18 MED ORDER — HEPARIN (PORCINE) 25000 UT/250ML-% IV SOLN
300.0000 [IU]/h | INTRAVENOUS | Status: AC
  Filled 2020-10-18: qty 250

## 2020-10-18 MED ORDER — KETOROLAC TROMETHAMINE 30 MG/ML IJ SOLN
30.0000 mg | Freq: Three times a day (TID) | INTRAMUSCULAR | Status: DC | PRN
  Administered 2020-10-18: 30 mg via INTRAVENOUS

## 2020-10-18 MED ORDER — ROCURONIUM BROMIDE 100 MG/10ML IV SOLN
INTRAVENOUS | Status: DC | PRN
  Administered 2020-10-18: 30 mg via INTRAVENOUS
  Administered 2020-10-18: 20 mg via INTRAVENOUS
  Administered 2020-10-18: 70 mg via INTRAVENOUS

## 2020-10-18 MED ORDER — LACTATED RINGERS IV SOLN: INTRAVENOUS | Status: DC

## 2020-10-18 MED ORDER — ASPIRIN EC 325 MG PO TBEC: 325.0000 mg | DELAYED_RELEASE_TABLET | ORAL | Status: DC

## 2020-10-18 MED ORDER — OXYCODONE HCL 5 MG PO TABS: 5.0000 mg | ORAL_TABLET | Freq: Once | ORAL | Status: DC | PRN

## 2020-10-18 MED ORDER — SODIUM CHLORIDE 0.9 % IV SOLN: INTRAVENOUS | Status: DC | PRN

## 2020-10-18 MED ORDER — NITROGLYCERIN 1 MG/10 ML FOR IR/CATH LAB: INTRA_ARTERIAL | Status: AC | PRN

## 2020-10-18 MED ORDER — IOHEXOL 300 MG/ML  SOLN
100.0000 mL | Freq: Once | INTRAMUSCULAR | Status: AC | PRN
  Administered 2020-10-18: 100 mL

## 2020-10-18 MED ORDER — KETOROLAC TROMETHAMINE 30 MG/ML IJ SOLN
INTRAMUSCULAR | Status: AC
  Filled 2020-10-18: qty 1

## 2020-10-18 MED ORDER — DEXAMETHASONE SODIUM PHOSPHATE 10 MG/ML IJ SOLN
INTRAMUSCULAR | Status: DC | PRN
  Administered 2020-10-18: 10 mg via INTRAVENOUS

## 2020-10-18 MED ORDER — IOHEXOL 300 MG/ML  SOLN
100.0000 mL | Freq: Once | INTRAMUSCULAR | Status: AC | PRN
  Administered 2020-10-18: 100 mL via INTRA_ARTERIAL

## 2020-10-18 MED ORDER — CHLORHEXIDINE GLUCONATE 0.12 % MT SOLN
15.0000 mL | Freq: Once | OROMUCOSAL | Status: AC
  Administered 2020-10-18: 15 mL via OROMUCOSAL
  Filled 2020-10-18: qty 15

## 2020-10-18 MED ORDER — PHENYLEPHRINE HCL (PRESSORS) 10 MG/ML IV SOLN
INTRAVENOUS | Status: DC | PRN
  Administered 2020-10-18 (×3): 80 ug via INTRAVENOUS
  Administered 2020-10-18 (×2): 40 ug via INTRAVENOUS

## 2020-10-18 MED ORDER — HEPARIN SODIUM (PORCINE) 1000 UNIT/ML IJ SOLN
INTRAMUSCULAR | Status: AC | PRN
Start: 2020-10-18 — End: 2020-10-18
  Administered 2020-10-18 (×2): 2000 [IU] via INTRAVENOUS

## 2020-10-18 MED ORDER — ASPIRIN 81 MG PO CHEW: 81.0000 mg | CHEWABLE_TABLET | Freq: Every day | ORAL | Status: DC

## 2020-10-18 MED ORDER — FENTANYL CITRATE (PF) 100 MCG/2ML IJ SOLN: 25.0000 ug | INTRAMUSCULAR | Status: DC | PRN

## 2020-10-18 MED ORDER — CLEVIDIPINE BUTYRATE 0.5 MG/ML IV EMUL
0.0000 mg/h | INTRAVENOUS | Status: DC
  Administered 2020-10-18: 1 mg/h via INTRAVENOUS

## 2020-10-18 MED ORDER — HEPARIN (PORCINE) 25000 UT/250ML-% IV SOLN
500.0000 [IU]/h | INTRAVENOUS | Status: DC
  Administered 2020-10-18 (×2): 500 [IU]/h via INTRAVENOUS

## 2020-10-18 MED ORDER — PROMETHAZINE HCL 25 MG/ML IJ SOLN: 6.2500 mg | INTRAMUSCULAR | Status: DC | PRN

## 2020-10-18 MED ORDER — ONDANSETRON HCL 4 MG/2ML IJ SOLN
INTRAMUSCULAR | Status: DC | PRN
  Administered 2020-10-18: 4 mg via INTRAVENOUS

## 2020-10-18 MED ORDER — SUGAMMADEX SODIUM 200 MG/2ML IV SOLN
INTRAVENOUS | Status: DC | PRN
  Administered 2020-10-18: 200 mg via INTRAVENOUS

## 2020-10-18 MED ORDER — IOHEXOL 300 MG/ML  SOLN
100.0000 mL | Freq: Once | INTRAMUSCULAR | Status: AC | PRN
  Administered 2020-10-18: 45 mL via INTRA_ARTERIAL

## 2020-10-18 MED ORDER — NITROGLYCERIN 1 MG/10 ML FOR IR/CATH LAB
INTRA_ARTERIAL | Status: AC
  Filled 2020-10-18: qty 20

## 2020-10-18 MED ORDER — PROPOFOL 10 MG/ML IV BOLUS
INTRAVENOUS | Status: DC | PRN
  Administered 2020-10-18: 20 mg via INTRAVENOUS
  Administered 2020-10-18: 100 mg via INTRAVENOUS
  Administered 2020-10-18: 50 mg via INTRAVENOUS
  Administered 2020-10-18: 30 mg via INTRAVENOUS

## 2020-10-18 MED ORDER — MIDAZOLAM HCL 2 MG/2ML IJ SOLN
INTRAMUSCULAR | Status: DC | PRN
  Administered 2020-10-18: 2 mg via INTRAVENOUS

## 2020-10-18 MED ORDER — ASPIRIN 81 MG PO CHEW
81.0000 mg | CHEWABLE_TABLET | Freq: Every day | ORAL | Status: DC
  Administered 2020-10-19: 81 mg via ORAL
  Filled 2020-10-18: qty 1

## 2020-10-18 MED ORDER — ACETAMINOPHEN 10 MG/ML IV SOLN
INTRAVENOUS | Status: AC
  Filled 2020-10-18: qty 100

## 2020-10-18 MED ORDER — ACETAMINOPHEN 650 MG RE SUPP: 650.0000 mg | RECTAL | Status: DC | PRN

## 2020-10-18 MED ORDER — CLOPIDOGREL BISULFATE 75 MG PO TABS: 75.0000 mg | ORAL_TABLET | ORAL | Status: DC

## 2020-10-18 NOTE — Progress Notes (Signed)
Informed Dr. Estanislado Pandy that patient took Aspirin '81mg'$  and '75mg'$  of Plavix this morning. Per Dr. Estanislado Pandy , do not give patient anymore Aspirin or Plavix and do not give Nimotop.

## 2020-10-18 NOTE — Anesthesia Procedure Notes (Signed)
Procedure Name: Intubation Date/Time: 10/18/2020 9:17 AM Performed by: Erick Colace, RN Pre-anesthesia Checklist: Patient identified, Emergency Drugs available, Suction available and Patient being monitored Patient Re-evaluated:Patient Re-evaluated prior to induction Oxygen Delivery Method: Circle system utilized Preoxygenation: Pre-oxygenation with 100% oxygen Induction Type: IV induction Ventilation: Mask ventilation without difficulty Laryngoscope Size: Glidescope and 4 Grade View: Grade I Tube type: Oral Tube size: 7.5 mm Number of attempts: 1 Airway Equipment and Method: Stylet, Oral airway and Video-laryngoscopy Placement Confirmation: ETT inserted through vocal cords under direct vision, positive ETCO2 and breath sounds checked- equal and bilateral Secured at: 23 cm Tube secured with: Tape Dental Injury: Teeth and Oropharynx as per pre-operative assessment

## 2020-10-18 NOTE — Sedation Documentation (Addendum)
ACT 242 notified Doctor

## 2020-10-18 NOTE — Transfer of Care (Signed)
Immediate Anesthesia Transfer of Care Note  Patient: Daniel Burton  Procedure(s) Performed: EMOBLIZATION  Patient Location: PACU  Anesthesia Type:General  Level of Consciousness: awake and alert   Airway & Oxygen Therapy: Patient Spontanous Breathing and Patient connected to face mask oxygen  Post-op Assessment: Report given to RN and Post -op Vital signs reviewed and stable  Post vital signs: Reviewed and stable  Last Vitals:  Vitals Value Taken Time  BP 163/81 10/18/20 1301  Temp    Pulse 68 10/18/20 1304  Resp 13 10/18/20 1304  SpO2 100 % 10/18/20 1304  Vitals shown include unvalidated device data.  Last Pain:  Vitals:   10/18/20 0718  TempSrc:   PainSc: 0-No pain         Complications: No notable events documented.

## 2020-10-18 NOTE — Progress Notes (Signed)
Interventional Radiology Brief Note:  PA/Dr. Estanislado Pandy paged to bedside for groin bleeding in PACU.  Patient s/p embolization of large R jugular bulb with stent placement via R CFV approach.  Groin assessed.  Small amount of oozing from venous puncture site, likely from tract.  RN holding pressure.  Gauze saturated.  Dr. Estanislado Pandy held additional pressure.  No evidence of hematoma.  Quikclot dressing applied, pressure dressing applied.   Patient alert, oriented.  No focal deficits.  Complains of headache.  Given tylenol prior to assessment.  Dr. Estanislado Pandy approves toradol 30 mg q 8 hrs PRN.   Patient without foley in place.  Will need assistance with urination in order to keep leg straight.  Discussed with RN.  Patient instructed to keep R leg completely straight and still. He verbalizes understanding.   Heparin to be held x1 hr.   PA to reassess tomorrow AM.   Brynda Greathouse, MS RD PA-C

## 2020-10-18 NOTE — H&P (Signed)
Chief Complaint: Patient was seen in consultation today for cerebral arteriogram with right high riding jugular bulb embolization at the request of A Ahern  Referring Physician(s): Dr Merlyn Albert  Supervising Physician: Luanne Bras  Patient Status: [facility] - Out-pt  History of Present Illness: Daniel Burton is a 60 y.o. male   Right side tinnitus for weeks Disruption of sleep and function Denies dizziness; vision or speech changes Denies N/V Was referred to Dr Jaynee Eagles April 2022 Arteriogram done then:   IMPRESSION: Prominent high-riding right jugular bulb projecting superiorly and laterally measuring approximately 10 mm x 13.9 mm. Relative narrowing of the right internal jugular vein at the skull base. Bilateral infundibulum in the posterior communicating artery regions.  Was seen in consultation with Dr Estanislado Pandy 07/09/20 and 09/08/20  CT 08/11/20:  IMPRESSION: 1. High riding right IJ bulb with thinning of its bony covering at the tympanic cavity.  Scheduled now for high riding jugular bulb embolization in IR with Dr Estanislado Pandy  Past Medical History:  Diagnosis Date   Back pain with history of spinal surgery    High cholesterol    Tinnitus of right ear 10/18/2020   jugular bulb    Past Surgical History:  Procedure Laterality Date   CERVICAL SPINE SURGERY  2021   ELBOW SURGERY Right 1979   HERNIA REPAIR     IR ANGIO INTRA EXTRACRAN SEL COM CAROTID INNOMINATE BILAT MOD SED  06/25/2020   IR ANGIO VERTEBRAL SEL SUBCLAVIAN INNOMINATE BILAT MOD SED  06/25/2020   IR RADIOLOGIST EVAL & MGMT  07/13/2020   IR RADIOLOGIST EVAL & MGMT  09/10/2020    Allergies: Patient has no known allergies.  Medications: Prior to Admission medications   Medication Sig Start Date End Date Taking? Authorizing Provider  aspirin EC 81 MG tablet Take 1 tablet (81 mg total) by mouth daily. Swallow whole. 10/01/20  Yes Docia Barrier, PA  clopidogrel (PLAVIX) 75 MG tablet Take 1  tablet (75 mg total) by mouth daily. 10/01/20  Yes Docia Barrier, PA  acetaZOLAMIDE (DIAMOX) 125 MG tablet Take 1 tablet (125 mg total) by mouth 2 (two) times daily. 06/08/20   Melvenia Beam, MD  fluticasone (FLONASE) 50 MCG/ACT nasal spray Place 1 spray into both nostrils daily for 14 days. Patient not taking: Reported on 10/12/2020 04/10/20 04/24/20  Emerson Monte, FNP  ibuprofen (ADVIL) 200 MG tablet Take 200 mg by mouth every 6 (six) hours as needed for headache or moderate pain.    [provider]  levocetirizine (XYZAL) 5 MG tablet Take 5 mg by mouth at bedtime. 10/06/20   [provider]  pantoprazole (PROTONIX) 40 MG tablet Take 40 mg by mouth daily. 10/06/20   [provider]     Family History  Problem Relation Age of Onset   Diabetes Mother    Throat cancer Father    Liver cancer Sister    Diabetes Brother    Thyroid disease Sister    Allergic rhinitis Neg Hx    Angioedema Neg Hx    Asthma Neg Hx    Urticaria Neg Hx     Social History   Socioeconomic History   Marital status: Married    Spouse name: Not on file   Number of children: 2   Years of education: 13   Highest education level: Not on file  Occupational History   Not on file  Tobacco Use   Smoking status: Former    Types: Cigarettes  Smokeless tobacco: Former  Scientific laboratory technician Use: Never used  Substance and Sexual Activity   Alcohol use: No   Drug use: Not Currently   Sexual activity: Not on file  Other Topics Concern   Not on file  Social History Narrative   Lives at home with wife   Right handed   Caffeine: tea occasionally    Social Determinants of Health   Financial Resource Strain: Not on file  Food Insecurity: Not on file  Transportation Needs: Not on file  Physical Activity: Not on file  Stress: Not on file  Social Connections: Not on file    Review of Systems: A 12 point ROS discussed and pertinent positives are indicated in the HPI above.   All other systems are negative.  Review of Systems  Constitutional:  Negative for activity change, fatigue and fever.  HENT:  Positive for tinnitus. Negative for ear discharge, facial swelling, hearing loss, sore throat and voice change.   Eyes:  Negative for visual disturbance.  Respiratory:  Negative for cough and shortness of breath.   Cardiovascular:  Negative for chest pain.  Gastrointestinal:  Negative for abdominal pain, diarrhea, nausea and vomiting.  Musculoskeletal:  Negative for back pain.  Neurological:  Negative for dizziness, tremors, seizures, syncope, facial asymmetry, speech difficulty, weakness, light-headedness, numbness and headaches.  Psychiatric/Behavioral:  Negative for behavioral problems and confusion.    Vital Signs: BP 140/78   Pulse (!) 58   Temp 98.1 F (36.7 C) (Oral)   Resp 18   Ht 6' (1.829 m)   Wt 165 lb (74.8 kg)   SpO2 99%   BMI 22.38 kg/m   Physical Exam Vitals reviewed.  Constitutional:      Appearance: Normal appearance.  HENT:     Mouth/Throat:     Mouth: Mucous membranes are moist.  Eyes:     Extraocular Movements: Extraocular movements intact.  Cardiovascular:     Rate and Rhythm: Normal rate and regular rhythm.     Heart sounds: Normal heart sounds.  Pulmonary:     Breath sounds: Normal breath sounds.  Abdominal:     Palpations: Abdomen is soft.     Tenderness: There is no abdominal tenderness.  Musculoskeletal:        General: Normal range of motion.     Cervical back: Normal range of motion.     Right lower leg: No edema.     Left lower leg: No edema.  Skin:    General: Skin is warm.  Neurological:     Mental Status: He is alert and oriented to person, place, and time.  Psychiatric:        Behavior: Behavior normal.    Imaging: No results found.  Labs:  CBC: Recent Labs    11/28/19 1159 06/25/20 0645 10/18/20 0636  WBC 5.7 5.3 5.2  HGB 13.5 13.8 12.9*  HCT 41.5 42.4 40.5  PLT  --  246 218     COAGS: Recent Labs    06/25/20 0645 10/18/20 0636  INR 0.9 0.9  APTT  --  25    BMP: Recent Labs    11/28/19 1159 02/11/20 1409 06/25/20 0645 10/18/20 0636  NA 146*  --  140 139  K 4.3  --  3.8 4.0  CL 108*  --  107 106  CO2 25  --  25 26  GLUCOSE 102*  --  126* 136*  BUN 16  --  15 13  CALCIUM 9.7  --  9.2 8.8*  CREATININE 0.96 1.00 0.91 1.01  GFRNONAA 87  --  >60 >60  GFRAA 100  --   --   --     LIVER FUNCTION TESTS: Recent Labs    11/28/19 1159  BILITOT 0.4  AST 24  ALT 22  ALKPHOS 112  PROT 7.3  ALBUMIN 4.8    TUMOR MARKERS: No results for input(s): AFPTM, CEA, CA199, CHROMGRNA in the last 8760 hours.  Assessment and Plan:  Rt sided tinnitus Rt high riding jugular bulb Prominent 13.9 mm x 10 mm superolaterally projecting high-riding jugular bulb. Associated with relative narrowing of the right internal jugular vein at the skull base. Scheduled for cerebral arteriogram with Rt jugular bulb embolization Risks and benefits of cerebral angiogram with intervention were discussed with the patient including, but not limited to bleeding, infection, vascular injury, contrast induced renal failure, stroke or even death.  This interventional procedure involves the use of X-rays and because of the nature of the planned procedure, it is possible that we will have prolonged use of X-ray fluoroscopy.  Potential radiation risks to you include (but are not limited to) the following: - A slightly elevated risk for cancer  several years later in life. This risk is typically less than 0.5% percent. This risk is low in comparison to the normal incidence of human cancer, which is 33% for women and 50% for men according to the [place]. - Radiation induced injury can include skin redness, resembling a rash, tissue breakdown / ulcers and hair loss (which can be temporary or permanent).   The likelihood of either of these occurring depends on the  difficulty of the procedure and whether you are sensitive to radiation due to previous procedures, disease, or genetic conditions.   IF your procedure requires a prolonged use of radiation, you will be notified and given written instructions for further action.  It is your responsibility to monitor the irradiated area for the 2 weeks following the procedure and to notify your physician if you are concerned that you have suffered a radiation induced injury.    All of the patient's questions were answered, patient is agreeable to proceed. Consent signed and in chart.  Pt is aware of procedure in IR--- aware he will be admitted to Neuro ICU after procedure for overnight observation.  Agreeable to proceed  Thank you for this interesting consult.  I greatly enjoyed meeting Daniel Burton and look forward to participating in their care.  A copy of this report was sent to the requesting provider on this date.  Electronically Signed: Lavonia Drafts, PA-C 10/18/2020, 8:11 AM   I spent a total of    25 Minutes in face to face in clinical consultation, greater than 50% of which was counseling/coordinating care for Rt high riding jugular bulb embolization

## 2020-10-18 NOTE — Anesthesia Procedure Notes (Signed)
Arterial Line Insertion Start/End7/25/2022 9:25 AM, 10/18/2020 9:31 AM Performed by: Freddrick March, MD, anesthesiologist  Patient location: Pre-op. Preanesthetic checklist: patient identified, IV checked, site marked, risks and benefits discussed, surgical consent, monitors and equipment checked, pre-op evaluation, timeout performed and anesthesia consent Lidocaine 1% used for infiltration radial was placed Catheter size: 20 Fr Hand hygiene performed  and maximum sterile barriers used   Attempts: 2 (first unsuccessful attempt by [facility]) Procedure performed without using ultrasound guided technique. Following insertion, dressing applied. Post procedure assessment: normal and unchanged

## 2020-10-18 NOTE — Procedures (Signed)
S/P RT common carotid arteriogram  RT rad approach. Bilateral TS/SS and INJ venograms. RT CFV approach. S/P embolization of large RT Jugular bulb with a 10 mm x 37 mm Wallstent. Extubated. Denies any N/V or visual abnormalities. RT temp H/A 5/10. Pupils 3 mm Rt = Lt  No facial asymmetry. Tongue midline. Moves all 4s equally. Rt groin soft. Distal pulses intact. CTY Brain neg for ICH. S.Ciena Sampley MD

## 2020-10-18 NOTE — Progress Notes (Signed)
ANTICOAGULATION CONSULT NOTE - Initial Consult  Pharmacy Consult for heparin Indication:  post-interventional neuroradiology procedure  No Known Allergies  Patient Measurements: Height: 6' (182.9 cm) Weight: 74.8 kg (165 lb) IBW/kg (Calculated) : 77.6 Heparin Dosing Weight: 74.8 kg   Vital Signs: Temp: 98.2 F (36.8 C) (07/25 1600) Temp Source: Axillary (07/25 1600) BP: 123/75 (07/25 1530) Pulse Rate: 68 (07/25 1900)  Labs: Recent Labs    10/18/20 0636 10/18/20 1900  HGB 12.9*  --   HCT 40.5  --   PLT 218  --   APTT 25  --   LABPROT 12.3  --   INR 0.9  --   HEPARINUNFRC  --  0.58  CREATININE 1.01  --     Estimated Creatinine Clearance: 83.3 mL/min (by C-G formula based on SCr of 1.01 mg/dL).   Medical History: Past Medical History:  Diagnosis Date   Back pain with history of spinal surgery    High cholesterol    Tinnitus of right ear 10/18/2020   jugular bulb    Medications:  Scheduled:   aspirin       [START ON 10/19/2020] aspirin  81 mg Oral Daily   Or   [START ON 10/19/2020] aspirin  81 mg Per Tube Daily   [START ON 10/19/2020] clopidogrel  75 mg Oral Daily   Or   [START ON 10/19/2020] clopidogrel  75 mg Per Tube Daily   ketorolac        Assessment: 61 yom who underwent embolization of large R jugular bulb with stent placement - no AC PTA.  Had groin bleeding in PACU - heparin was held for 1 hour. Heparin level upon restart came back at 0.58, on 500 units/hr (drawn early, ~4 hr after restart). No infusion issues - drawn correctly (on artery line, heparin infusing in opposite arm IV). Groin bleeding stabilized - no other s/sx of bleeding.   Goal of Therapy:  Heparin level 0.1-0.25 units/ml Monitor platelets by anticoagulation protocol: Yes   Plan:  Hold heparin for 1 hour Reduce heparin infusion to 300 units/hr  Order heparin level in 6 hours after restart >> heparin to stop on 7/26'@0800'$   Monitor for s/sx of bleeding   Antonietta Jewel, PharmD,  [place] Pharmacist  Phone: [phone] 10/18/2020 7:39 PM  Please check AMION for all [place] phone numbers After 10:00 PM, call [place] [phone]

## 2020-10-19 ENCOUNTER — Encounter (HOSPITAL_COMMUNITY): Payer: Self-pay | Admitting: Interventional Radiology

## 2020-10-19 DIAGNOSIS — Z7902 Long term (current) use of antithrombotics/antiplatelets: Secondary | ICD-10-CM | POA: Diagnosis not present

## 2020-10-19 DIAGNOSIS — Z7982 Long term (current) use of aspirin: Secondary | ICD-10-CM | POA: Diagnosis not present

## 2020-10-19 DIAGNOSIS — Z87891 Personal history of nicotine dependence: Secondary | ICD-10-CM | POA: Diagnosis not present

## 2020-10-19 DIAGNOSIS — Z9889 Other specified postprocedural states: Secondary | ICD-10-CM | POA: Diagnosis not present

## 2020-10-19 DIAGNOSIS — Z79899 Other long term (current) drug therapy: Secondary | ICD-10-CM | POA: Diagnosis not present

## 2020-10-19 DIAGNOSIS — H93A1 Pulsatile tinnitus, right ear: Secondary | ICD-10-CM | POA: Diagnosis not present

## 2020-10-19 DIAGNOSIS — H9311 Tinnitus, right ear: Secondary | ICD-10-CM | POA: Diagnosis not present

## 2020-10-19 LAB — BASIC METABOLIC PANEL
Anion gap: 8 (ref 5–15)
BUN: 11 mg/dL (ref 6–20)
CO2: 22 mmol/L (ref 22–32)
Calcium: 7.9 mg/dL — ABNORMAL LOW (ref 8.9–10.3)
Chloride: 110 mmol/L (ref 98–111)
Creatinine, Ser: 0.86 mg/dL (ref 0.61–1.24)
GFR, Estimated: >60 mL/min (ref >60)
Glucose, Bld: 130 mg/dL — ABNORMAL HIGH (ref 70–99)
Potassium: 3.6 mmol/L (ref 3.5–5.1)
Sodium: 140 mmol/L (ref 135–145)

## 2020-10-19 LAB — CBC WITH DIFFERENTIAL/PLATELET
Abs Immature Granulocytes: 0.03 10*3/uL (ref 0.00–0.07)
Basophils Absolute: 0 10*3/uL (ref 0.0–0.1)
Basophils Relative: 0 %
Eosinophils Absolute: 0 10*3/uL (ref 0.0–0.5)
Eosinophils Relative: 0 %
HCT: 36.6 % — ABNORMAL LOW (ref 39.0–52.0)
Hemoglobin: 12.2 g/dL — ABNORMAL LOW (ref 13.0–17.0)
Immature Granulocytes: 0 %
Lymphocytes Relative: 10 %
Lymphs Abs: 1.1 10*3/uL (ref 0.7–4.0)
MCH: 27.5 pg (ref 26.0–34.0)
MCHC: 33.3 g/dL (ref 30.0–36.0)
MCV: 82.6 fL (ref 80.0–100.0)
Monocytes Absolute: 0.4 10*3/uL (ref 0.1–1.0)
Monocytes Relative: 4 %
Neutro Abs: 9.7 10*3/uL — ABNORMAL HIGH (ref 1.7–7.7)
Neutrophils Relative %: 86 %
Platelets: 217 10*3/uL (ref 150–400)
RBC: 4.43 MIL/uL (ref 4.22–5.81)
RDW: 14.9 % (ref 11.5–15.5)
WBC: 11.3 10*3/uL — ABNORMAL HIGH (ref 4.0–10.5)
nRBC: 0 % (ref 0.0–0.2)

## 2020-10-19 LAB — HEPARIN LEVEL (UNFRACTIONATED): Heparin Unfractionated: <0.1 IU/mL — ABNORMAL LOW (ref 0.30–0.70)

## 2020-10-19 NOTE — Progress Notes (Signed)
Pt discharged home with wife. Discharge instructions reviewed with pt and pts wife.

## 2020-10-19 NOTE — Anesthesia Postprocedure Evaluation (Signed)
Anesthesia Post Note  Patient: Daniel Burton  Procedure(s) Performed: EMOBLIZATION     Patient location during evaluation: PACU Anesthesia Type: General Level of consciousness: awake Pain management: pain level controlled Vital Signs Assessment: post-procedure vital signs reviewed and stable Respiratory status: spontaneous breathing, nonlabored ventilation, respiratory function stable and patient connected to nasal cannula oxygen Cardiovascular status: blood pressure returned to baseline and stable Postop Assessment: no apparent nausea or vomiting Anesthetic complications: no   No notable events documented.  Last Vitals:  Vitals:   10/19/20 0500 10/19/20 0600  BP: 116/72 123/75  Pulse: 70 74  Resp: 15 16  Temp:    SpO2: 97% 98%    Last Pain:  Vitals:   10/19/20 0400  TempSrc: Oral  PainSc: Asleep                 [name] P Merdith Boyd

## 2020-10-19 NOTE — Discharge Summary (Signed)
Patient ID: ZYQUEZ SCARFONE MRN: [id number] DOB/AGE: 1960/05/27 60 y.o.  Admit date: 10/18/2020 Discharge date: 10/19/2020  Supervising Physician: Luanne Bras  Patient Status: [facility]  Admission Diagnoses: Pulsatile tinnitus right ear  Discharge Diagnoses: Pulsatile tinnitus right ear,S/P RT common carotid arteriogram RT rad approach. Bilateral TS/SS and INJ venograms. RT CFV approach. S/P embolization of large RT Jugular bulb with a 10 mm x 37 mm Wallstent 10/18/20 Active Problems:   Pulsatile tinnitus of right ear  Past Medical History:  Diagnosis Date   Back pain with history of spinal surgery    High cholesterol    Tinnitus of right ear 10/18/2020   jugular bulb   Past Surgical History:  Procedure Laterality Date   CERVICAL SPINE SURGERY  2021   ELBOW SURGERY Right 1979   HERNIA REPAIR     IR ANGIO INTRA EXTRACRAN SEL COM CAROTID INNOMINATE BILAT MOD SED  06/25/2020   IR ANGIO VERTEBRAL SEL SUBCLAVIAN INNOMINATE BILAT MOD SED  06/25/2020   IR RADIOLOGIST EVAL & MGMT  07/13/2020   IR RADIOLOGIST EVAL & MGMT  09/10/2020    Discharged Condition: good  Hospital Course: Mr. Limbrick is a 60 year old male with history of right pulsatile tinnitus for weeks which has disrupted his sleep and physical activities.  He was seen in consultation by Dr. Estanislado Pandy on 07/09/2020 and 09/08/2020.  Previous imaging had revealed a prominent high riding right jugular bulb projecting superiorly and laterally measuring approximately 10 mm x 13.9 mm with relative narrowing of the right internal jugular vein at the skull base.  There was bilateral infundibulum in the posterior communicating artery regions.  Following discussions with Dr. Estanislado Pandy the patient underwent RT common carotid arteriogram via RT rad approach.Bilateral TS/SS and INJ venograms via RT CFV approach.S/P embolization of large RT Jugular bulb with a 10 mm x 37 mm Wallstent on 10/18/20.  The procedure was performed with  general anesthesia and without immediate complications.  Post procedure the patient was extubated and admitted to the neurointensive care unit.  IV heparin was initiated.  On the morning of discharge the patient was stable.  He no longer complained of pulsatile tinnitus.  He did report 1 episode of hot flashes and mild dizziness overnight ,however on day of discharge this subsided.  He denied fever, worsening headache, chest pain, dyspnea, cough, abdominal/back pain, nausea, vomiting or bleeding.  Neuro exam was normal.  Access sites right radial artery and right common femoral vein clean, dry, no significant hematoma.  Patient was able to tolerate his diet and ambulate without significant difficulty.  IV heparin was discontinued.  Follow-up labs revealed WBC 11.3, hemoglobin 12.2, platelets normal, creatinine normal.  Patient was seen/evaluated by Dr. Estanislado Pandy and deemed appropriate for discharge home at this time. He will continue his current home medications including Plavix and aspirin.  He will be scheduled for follow-up in the [place] office in 2 weeks.  Patient will follow-up with primary care physician for blood pressure check.  He was told to contact our service with any additional questions or concerns.  Consults: anesthesia  Significant Diagnostic Studies:  Recent Results (from the past 2160 hour(s))  Platelet inhibition p2y12     Status: Abnormal   Collection Time: 10/14/20 11:56 AM  Result Value Ref Range   Platelet Function  P2Y12 108 (L) 182 - 335 PRU    Comment: PERFORMED AT [facility]. (NOTE) The literature has shown a direct correlation of PRU values over 230 with higher risks  of thrombotic events. Lower PRU values are associated with platelet inhibition. Performed at [facility], [place] [address], [place], [place] [address]   SARS CORONAVIRUS 2 (TAT 6-24 HRS) Nasopharyngeal Nasopharyngeal Swab     Status: None   Collection Time: 10/14/20  3:18 PM   Specimen:  Nasopharyngeal Swab  Result Value Ref Range   SARS Coronavirus 2 NEGATIVE NEGATIVE    Comment: (NOTE) SARS-CoV-2 target nucleic acids are NOT DETECTED.  The SARS-CoV-2 RNA is generally detectable in upper and lower respiratory specimens during the acute phase of infection. Negative results do not preclude SARS-CoV-2 infection, do not rule out co-infections with other pathogens, and should not be used as the sole basis for treatment or other patient management decisions. Negative results must be combined with clinical observations, patient history, and epidemiological information. The expected result is Negative.  Fact Sheet for Patients: SugarRoll.be  Fact Sheet for Healthcare Providers: https://www.woods-mathews.com/  This test is not yet approved or cleared by the [place] FDA and  has been authorized for detection and/or diagnosis of SARS-CoV-2 by FDA under an Emergency Use Authorization (EUA). This EUA will remain  in effect (meaning this test can be used) for the duration of the COVID-19 declaration under Se ction 564(b)(1) of the Act, 21 U.S.C. section 360bbb-3(b)(1), unless the authorization is terminated or revoked sooner.  Performed at [facility], [place] [address] [place], [place] [address]   APTT     Status: None   Collection Time: 10/18/20  6:36 AM  Result Value Ref Range   aPTT 25 24 - 36 seconds    Comment: Performed at [facility] [address] [place], [place] [id number]  Basic metabolic panel     Status: Abnormal   Collection Time: 10/18/20  6:36 AM  Result Value Ref Range   Sodium 139 135 - 145 mmol/L   Potassium 4.0 3.5 - 5.1 mmol/L   Chloride 106 98 - 111 mmol/L   CO2 26 22 - 32 mmol/L   Glucose, Bld 136 (H) 70 - 99 mg/dL    Comment: Glucose reference range applies only to samples taken after fasting for at least 8 hours.   BUN 13 6 - 20 mg/dL   Creatinine, Ser 1.01 0.61 - 1.24 mg/dL    Calcium 8.8 (L) 8.9 - 10.3 mg/dL   GFR, Estimated >60 >60 mL/min    Comment: (NOTE) Calculated using the CKD-EPI Creatinine Equation (2021)    Anion gap 7 5 - 15    Comment: Performed at [facility] [address] [place], [place] [address]  Protime-INR     Status: None   Collection Time: 10/18/20  6:36 AM  Result Value Ref Range   Prothrombin Time 12.3 11.4 - 15.2 seconds   INR 0.9 0.8 - 1.2    Comment: (NOTE) INR goal varies based on device and disease states. Performed at [facility], [place] [address], [place], [place] [address]   CBC     Status: Abnormal   Collection Time: 10/18/20  6:36 AM  Result Value Ref Range   WBC 5.2 4.0 - 10.5 K/uL   RBC 4.77 4.22 - 5.81 MIL/uL   Hemoglobin 12.9 (L) 13.0 - 17.0 g/dL   HCT 40.5 39.0 - 52.0 %   MCV 84.9 80.0 - 100.0 fL   MCH 27.0 26.0 - 34.0 pg   MCHC 31.9 30.0 - 36.0 g/dL   RDW 14.8 11.5 - 15.5 %  Platelets 218 150 - 400 K/uL   nRBC 0.0 0.0 - 0.2 %    Comment: Performed at [facility], [place] [address] [place], [place] [address]  Urinalysis, Complete w Microscopic Urine, Clean Catch     Status: None   Collection Time: 10/18/20  7:43 AM  Result Value Ref Range   Color, Urine YELLOW YELLOW   APPearance CLEAR CLEAR   Specific Gravity, Urine 1.024 1.005 - 1.030   pH 5.0 5.0 - 8.0   Glucose, UA NEGATIVE NEGATIVE mg/dL   Hgb urine dipstick NEGATIVE NEGATIVE   Bilirubin Urine NEGATIVE NEGATIVE   Ketones, ur NEGATIVE NEGATIVE mg/dL   Protein, ur NEGATIVE NEGATIVE mg/dL   Nitrite NEGATIVE NEGATIVE   Leukocytes,Ua NEGATIVE NEGATIVE   RBC / HPF 0-5 0 - 5 RBC/hpf   WBC, UA 0-5 0 - 5 WBC/hpf   Bacteria, UA NONE SEEN NONE SEEN   Squamous Epithelial / LPF 0-5 0 - 5   Mucus PRESENT     Comment: Performed at [facility], [place] [address], [place], [place] [address]  POCT Activated clotting time     Status: None   Collection Time: 10/18/20 11:57 AM  Result Value Ref Range   Activated Clotting Time 242 seconds   MRSA Next Gen by PCR, Nasal     Status: None   Collection Time: 10/18/20  2:44 PM   Specimen: Nasal Mucosa; Nasal Swab  Result Value Ref Range   MRSA by PCR Next Gen NOT DETECTED NOT DETECTED    Comment: (NOTE) The GeneXpert MRSA Assay (FDA approved for NASAL specimens only), is one component of a comprehensive MRSA colonization surveillance program. It is not intended to diagnose MRSA infection nor to guide or monitor treatment for MRSA infections. Test performance is not FDA approved in patients less than 38 years old. Performed at [facility], [place] [address] [place], [place] [address]   Heparin level (unfractionated)     Status: None   Collection Time: 10/18/20  7:00 PM  Result Value Ref Range   Heparin Unfractionated 0.58 0.30 - 0.70 IU/mL    Comment: (NOTE) The clinical reportable range upper limit is being lowered to >1.10 to align with the FDA approved guidance for the current laboratory assay.  If heparin results are below expected values, and patient dosage has  been confirmed, suggest follow up testing of antithrombin III levels. Performed at [facility], [place] [address] [place], [place] [address]   CBC with Differential/Platelet     Status: Abnormal   Collection Time: 10/19/20  5:26 AM  Result Value Ref Range   WBC 11.3 (H) 4.0 - 10.5 K/uL   RBC 4.43 4.22 - 5.81 MIL/uL   Hemoglobin 12.2 (L) 13.0 - 17.0 g/dL   HCT 36.6 (L) 39.0 - 52.0 %   MCV 82.6 80.0 - 100.0 fL   MCH 27.5 26.0 - 34.0 pg   MCHC 33.3 30.0 - 36.0 g/dL   RDW 14.9 11.5 - 15.5 %   Platelets 217 150 - 400 K/uL   nRBC 0.0 0.0 - 0.2 %   Neutrophils Relative % 86 %   Neutro Abs 9.7 (H) 1.7 - 7.7 K/uL   Lymphocytes Relative 10 %   Lymphs Abs 1.1 0.7 - 4.0 K/uL   Monocytes Relative 4 %   Monocytes Absolute 0.4 0.1 - 1.0 K/uL   Eosinophils Relative 0 %   Eosinophils Absolute 0.0 0.0 - 0.5 K/uL   Basophils  Relative 0 %   Basophils Absolute 0.0 0.0 - 0.1 K/uL   Immature Granulocytes 0  %   Abs Immature Granulocytes 0.03 0.00 - 0.07 K/uL    Comment: Performed at [facility], [place] [address] [place], [place] [id number]  Basic metabolic panel     Status: Abnormal   Collection Time: 10/19/20  5:26 AM  Result Value Ref Range   Sodium 140 135 - 145 mmol/L   Potassium 3.6 3.5 - 5.1 mmol/L   Chloride 110 98 - 111 mmol/L   CO2 22 22 - 32 mmol/L   Glucose, Bld 130 (H) 70 - 99 mg/dL    Comment: Glucose reference range applies only to samples taken after fasting for at least 8 hours.   BUN 11 6 - 20 mg/dL   Creatinine, Ser 0.86 0.61 - 1.24 mg/dL   Calcium 7.9 (L) 8.9 - 10.3 mg/dL   GFR, Estimated >60 >60 mL/min    Comment: (NOTE) Calculated using the CKD-EPI Creatinine Equation (2021)    Anion gap 8 5 - 15    Comment: Performed at [facility] [address], [place], [place] [address]  Heparin level (unfractionated)     Status: Abnormal   Collection Time: 10/19/20  5:26 AM  Result Value Ref Range   Heparin Unfractionated <0.10 (L) 0.30 - 0.70 IU/mL    Comment: (NOTE) The clinical reportable range upper limit is being lowered to >1.10 to align with the FDA approved guidance for the current laboratory assay.  If heparin results are below expected values, and patient dosage has  been confirmed, suggest follow up testing of antithrombin III levels. Performed at [facility], [place] [address] [place], [place] [address]      Treatments: S/P RT common carotid arteriogram RT rad approach. Bilateral TS/SS and INJ venograms. RT CFV approach. S/P embolization of large RT Jugular bulb with a 10 mm x 37 mm Wallstent 10/18/20  Discharge Exam: Blood pressure 130/81, pulse 69, temperature 98 F (36.7 C), temperature source Axillary, resp. rate 13, height 6' (1.829 m), weight 165 lb (74.8 kg), SpO2 100 %. Patient awake, alert.  Speech normal, tongue midline, no drift, pupils equal round reactive light and accommodation.  Strength 5/5 in all four extremities.   Finger-to-nose normal; chest clear to auscultation bilaterally.  Heart with regular rate and rhythm.  Abdomen soft, positive bowel sounds, nontender.  No lower extremity edema.  Puncture sites right radial artery and right common femoral vein soft, clean, dry, nontender, no hematomas.  Intact distal pulses.  Disposition: Discharge disposition: 01-Home or Self Care       Discharge Instructions     Call MD for:  difficulty breathing, headache or visual disturbances   Complete by: As directed    Call MD for:  extreme fatigue   Complete by: As directed    Call MD for:  hives   Complete by: As directed    Call MD for:  persistant dizziness or light-headedness   Complete by: As directed    Call MD for:  persistant nausea and vomiting   Complete by: As directed    Call MD for:  redness, tenderness, or signs of infection (pain, swelling, redness, odor or green/yellow discharge around incision site)   Complete by: As directed    Call MD for:  severe uncontrolled pain   Complete by: As directed    Call MD for:  temperature >100.4   Complete by: As directed  Change dressing (specify)   Complete by: As directed    May apply gauze dressing to puncture site right groin region for the next 2 to 3 days.  May wash site with soap and water.   Diet - low sodium heart healthy   Complete by: As directed    Discharge instructions   Complete by: As directed    Stay well-hydrated, avoid strenuous activity, heavy lifting or lifting any objects greater than 10 pounds, sudden twisting and turning of head and neck region for the next 2 weeks.  Avoid driving for the next 2 weeks.  Follow-up with primary care physician next week to check blood pressure.  May resume home medications.   Driving Restrictions   Complete by: As directed    No driving for the next 2 weeks   Increase activity slowly   Complete by: As directed    Lifting restrictions   Complete by: As directed    No strenuous activity or  lifting objects greater than 10 pounds for the next 2 weeks.  Avoid twisting/jerking movements of head and neck region for the next 2 weeks      Allergies as of 10/19/2020   No Known Allergies      Medication List     STOP taking these medications    fluticasone 50 MCG/ACT nasal spray Commonly known as: FLONASE       TAKE these medications    acetaZOLAMIDE 125 MG tablet Commonly known as: DIAMOX Take 1 tablet (125 mg total) by mouth 2 (two) times daily.   aspirin EC 81 MG tablet Take 1 tablet (81 mg total) by mouth daily. Swallow whole.   clopidogrel 75 MG tablet Commonly known as: Plavix Take 1 tablet (75 mg total) by mouth daily.   ibuprofen 200 MG tablet Commonly known as: ADVIL Take 200 mg by mouth every 6 (six) hours as needed for headache or moderate pain.   levocetirizine 5 MG tablet Commonly known as: XYZAL Take 5 mg by mouth at bedtime.   pantoprazole 40 MG tablet Commonly known as: PROTONIX Take 40 mg by mouth daily.               Discharge Care Instructions  (From admission, onward)           Start     Ordered   10/19/20 0000  Change dressing (specify)       Comments: May apply gauze dressing to puncture site right groin region for the next 2 to 3 days.  May wash site with soap and water.   10/19/20 0919            Follow-up Information     Luanne Bras, MD Follow up.   Specialties: Interventional Radiology, Radiology Why: Dr. Estanislado Pandy will contact you with follow-up in [place] in 2 weeks; call [phone] or [phone] with any questions Contact information: [place] [address] [phone]         Celene Squibb, MD Follow up.   Specialty: Internal Medicine Why: Follow-up with Dr. Nevada Crane next week to check blood pressure Contact information: [place] [facility] [address] [phone]                  Electronically Signed: D. Rowe Robert, PA-C 10/19/2020, 9:26 AM   I  have spent Less Than 30 Minutes discharging BRECKEN BARKUS.

## 2020-10-19 NOTE — Discharge Instructions (Signed)
  Stay well-hydrated, resume current home medications.  Avoid strenuous activity/heavy lifting any objects greater than 10 pounds or sudden twisting jerking movements of the head and neck region for the next 2 weeks.  May wash puncture sites right wrist and right groin region with soap and water.  Monitor sites for any swelling or bleeding.  May apply bandage over areas for the next 2 to 3 days.  Follow-up with primary care physician next week to check blood pressure.  NIR service will contact you for follow-up appointment and 2 weeks.  Call [phone] with any questions.

## 2020-10-26 DIAGNOSIS — R030 Elevated blood-pressure reading, without diagnosis of hypertension: Secondary | ICD-10-CM | POA: Insufficient documentation

## 2020-10-26 DIAGNOSIS — H93A1 Pulsatile tinnitus, right ear: Secondary | ICD-10-CM | POA: Diagnosis not present

## 2020-11-01 ENCOUNTER — Ambulatory Visit (HOSPITAL_COMMUNITY)
Admission: RE | Admit: 2020-11-01 | Discharge: 2020-11-01 | Disposition: A | Discharge status: Home / Self Care | Payer: BC Managed Care – PPO | Source: Ambulatory Visit | Attending: Radiology | Admitting: Radiology

## 2020-11-01 ENCOUNTER — Other Ambulatory Visit: Payer: Self-pay

## 2020-11-01 ENCOUNTER — Other Ambulatory Visit (HOSPITAL_COMMUNITY): Payer: Self-pay | Admitting: Radiology

## 2020-11-01 DIAGNOSIS — H9319 Tinnitus, unspecified ear: Secondary | ICD-10-CM

## 2020-11-01 DIAGNOSIS — Z9889 Other specified postprocedural states: Secondary | ICD-10-CM | POA: Diagnosis not present

## 2020-11-01 DIAGNOSIS — H93A1 Pulsatile tinnitus, right ear: Secondary | ICD-10-CM | POA: Diagnosis not present

## 2020-11-01 DIAGNOSIS — G9389 Other specified disorders of brain: Secondary | ICD-10-CM | POA: Diagnosis not present

## 2020-11-01 DIAGNOSIS — Z7902 Long term (current) use of antithrombotics/antiplatelets: Secondary | ICD-10-CM | POA: Diagnosis not present

## 2020-11-01 MED ORDER — CLOPIDOGREL BISULFATE 75 MG PO TABS: 75.0000 mg | ORAL_TABLET | Freq: Every day | ORAL | 2 refills | Status: DC

## 2020-11-03 HISTORY — PX: IR RADIOLOGIST EVAL & MGMT: IMG5224

## 2020-11-04 ENCOUNTER — Other Ambulatory Visit (HOSPITAL_COMMUNITY): Payer: Self-pay | Admitting: Student

## 2020-11-04 DIAGNOSIS — H9319 Tinnitus, unspecified ear: Secondary | ICD-10-CM

## 2020-11-04 MED ORDER — CLOPIDOGREL BISULFATE 75 MG PO TABS: 75.0000 mg | ORAL_TABLET | Freq: Every day | ORAL | 2 refills | Status: DC

## 2020-11-18 ENCOUNTER — Other Ambulatory Visit (HOSPITAL_COMMUNITY): Payer: Self-pay | Admitting: Interventional Radiology

## 2020-11-18 DIAGNOSIS — H93A9 Pulsatile tinnitus, unspecified ear: Secondary | ICD-10-CM

## 2020-12-02 ENCOUNTER — Ambulatory Visit (HOSPITAL_COMMUNITY)
Admission: RE | Admit: 2020-12-02 | Discharge: 2020-12-02 | Disposition: A | Discharge status: Home / Self Care | Payer: BC Managed Care – PPO | Source: Ambulatory Visit | Attending: Interventional Radiology | Admitting: Interventional Radiology

## 2020-12-02 ENCOUNTER — Other Ambulatory Visit (HOSPITAL_COMMUNITY): Payer: Self-pay | Admitting: Radiology

## 2020-12-02 ENCOUNTER — Other Ambulatory Visit: Payer: Self-pay

## 2020-12-02 DIAGNOSIS — Z9889 Other specified postprocedural states: Secondary | ICD-10-CM | POA: Diagnosis not present

## 2020-12-02 DIAGNOSIS — G9389 Other specified disorders of brain: Secondary | ICD-10-CM | POA: Diagnosis not present

## 2020-12-02 DIAGNOSIS — H93A9 Pulsatile tinnitus, unspecified ear: Secondary | ICD-10-CM

## 2020-12-02 DIAGNOSIS — H9319 Tinnitus, unspecified ear: Secondary | ICD-10-CM

## 2020-12-02 DIAGNOSIS — H93A1 Pulsatile tinnitus, right ear: Secondary | ICD-10-CM | POA: Diagnosis not present

## 2020-12-02 MED ORDER — CLOPIDOGREL BISULFATE 75 MG PO TABS: 75.0000 mg | ORAL_TABLET | Freq: Every day | ORAL | 6 refills | Status: DC

## 2020-12-03 HISTORY — PX: IR RADIOLOGIST EVAL & MGMT: IMG5224

## 2020-12-08 ENCOUNTER — Other Ambulatory Visit (HOSPITAL_COMMUNITY): Payer: Self-pay | Admitting: Surgery

## 2020-12-10 ENCOUNTER — Other Ambulatory Visit (HOSPITAL_COMMUNITY): Payer: Self-pay | Admitting: Interventional Radiology

## 2020-12-10 DIAGNOSIS — H93A9 Pulsatile tinnitus, unspecified ear: Secondary | ICD-10-CM

## 2020-12-24 ENCOUNTER — Ambulatory Visit (HOSPITAL_COMMUNITY): Admission: RE | Admit: 2020-12-24 | Payer: BC Managed Care – PPO | Source: Ambulatory Visit

## 2020-12-24 ENCOUNTER — Encounter (HOSPITAL_COMMUNITY): Payer: Self-pay

## 2020-12-27 ENCOUNTER — Telehealth: Payer: Self-pay | Admitting: Family Medicine

## 2020-12-27 ENCOUNTER — Telehealth (HOSPITAL_COMMUNITY): Payer: Self-pay

## 2020-12-27 NOTE — Telephone Encounter (Signed)
Called to reschedule mrv, no answer, left vm. AW

## 2020-12-27 NOTE — Telephone Encounter (Signed)
Thank you :)

## 2020-12-27 NOTE — Telephone Encounter (Signed)
You're welcome!

## 2020-12-27 NOTE — Telephone Encounter (Signed)
Received your note to call Daniel Burton, expressing interest in reestablishing here under you.  It appears he has established with Dr. Nevada Crane. Do I need still contact him? Please advise.

## 2020-12-27 NOTE — Telephone Encounter (Signed)
Yes-please give him a call.  He can decide if he wants to reestablish with [place] or stay with Dr. Nevada Crane. (I saw him at [facility] he voiced an interest in coming back our way at that time that was in August I believe-so essentially a no pressure phone call he can decide at that time if he wants to stay with Dr. Nevada Crane removed back our way with myself as the attending thank you)

## 2020-12-27 NOTE — Telephone Encounter (Signed)
Just called and had to leave a message. Let him know if he was interested in reestablishing here with you as attending to give [place] a call back.

## 2021-01-05 ENCOUNTER — Other Ambulatory Visit: Payer: Self-pay | Admitting: Physician Assistant

## 2021-01-11 ENCOUNTER — Telehealth (HOSPITAL_COMMUNITY): Payer: Self-pay

## 2021-01-11 NOTE — Telephone Encounter (Signed)
Called to schedule mrv, no answer, left vm. AW  

## 2021-01-21 ENCOUNTER — Ambulatory Visit (HOSPITAL_COMMUNITY): Payer: BC Managed Care – PPO

## 2021-02-01 ENCOUNTER — Other Ambulatory Visit: Payer: Self-pay

## 2021-02-01 ENCOUNTER — Ambulatory Visit (HOSPITAL_COMMUNITY): Payer: BC Managed Care – PPO

## 2021-02-01 ENCOUNTER — Encounter: Payer: Self-pay | Admitting: Family Medicine

## 2021-02-01 ENCOUNTER — Ambulatory Visit (INDEPENDENT_AMBULATORY_CARE_PROVIDER_SITE_OTHER): Payer: BC Managed Care – PPO | Admitting: Family Medicine

## 2021-02-01 VITALS — BP 110/68 | Temp 97.7°F | Ht 69.25 in | Wt 169.0 lb

## 2021-02-01 DIAGNOSIS — E785 Hyperlipidemia, unspecified: Secondary | ICD-10-CM

## 2021-02-01 DIAGNOSIS — Z79899 Other long term (current) drug therapy: Secondary | ICD-10-CM

## 2021-02-01 DIAGNOSIS — Z125 Encounter for screening for malignant neoplasm of prostate: Secondary | ICD-10-CM

## 2021-02-01 DIAGNOSIS — R7303 Prediabetes: Secondary | ICD-10-CM

## 2021-02-01 DIAGNOSIS — Z0000 Encounter for general adult medical examination without abnormal findings: Secondary | ICD-10-CM | POA: Diagnosis not present

## 2021-02-01 DIAGNOSIS — Z131 Encounter for screening for diabetes mellitus: Secondary | ICD-10-CM | POA: Diagnosis not present

## 2021-02-01 MED ORDER — SILDENAFIL CITRATE 100 MG PO TABS: 50.0000 mg | ORAL_TABLET | Freq: Every day | ORAL | 11 refills | Status: DC | PRN

## 2021-02-01 NOTE — Progress Notes (Signed)
Daniel Burton   Subjective:    Patient ID: Daniel Burton, male    DOB: [birth year], 60 y.o.   MRN: [id number]  HPI The patient comes in today for a wellness visit.  Patient states he is trying to be healthy stays away from smoking does not drink tries to eat healthy is tries to stay active.  Bowels moving okay urinating okay  A review of their health history was completed.  A review of medications was also completed.  Any needed refills; not at this time   Eating habits: eating well  Falls/  MVA accidents in past few months: none  Regular exercise: active job  Specialist pt sees on regular basis: neurology  Preventative health issues were discussed.   Additional concerns:     Review of Systems     Objective:   Physical Exam  General-in no acute distress Eyes-no discharge Lungs-respiratory rate normal, CTA CV-no murmurs,RRR Extremities skin warm dry no edema Neuro grossly normal Behavior normal, alert Patient defers rectal exam      Assessment & Plan:  1. Well adult exam Adult wellness-complete.wellness physical was conducted today. Importance of diet and exercise were discussed in detail.  In addition to this a discussion regarding safety was also covered. We also reviewed over immunizations and gave recommendations regarding current immunization needed for age.  In addition to this additional areas were also touched on including: Preventative health exams needed:  Colonoscopy currently up-to-date had this at [facility] we will try to get the copy sent to [place] for our review  Patient was advised yearly wellness exam  - Hemoglobin A1c - Lipid Profile - Basic Metabolic Panel (BMET) - Hepatic function panel - PSA - Urine Microalbumin w/creat. ratio  2. High risk medication use Labs ordered await results - Hemoglobin A1c - Lipid Profile - Basic Metabolic Panel (BMET) - Hepatic function panel - PSA - Urine Microalbumin w/creat. ratio  3. Hyperlipidemia,  unspecified hyperlipidemia type Healthy diet labs ordered await results - Hemoglobin A1c - Lipid Profile - Basic Metabolic Panel (BMET) - Hepatic function panel - PSA - Urine Microalbumin w/creat. ratio  4. Screening for diabetes mellitus Labs ordered await results - Hemoglobin A1c - Lipid Profile - Basic Metabolic Panel (BMET) - Hepatic function panel - PSA - Urine Microalbumin w/creat. ratio  5. Screening PSA (prostate specific antigen) Labs ordered await results - Hemoglobin A1c - Lipid Profile - Basic Metabolic Panel (BMET) - Hepatic function panel - PSA - Urine Microalbumin w/creat. ratio  Patient relates with some mild erectile dysfunction states has a hard time maintaining erection would like to try Viagra we talked about dosing he will start off just a half a tablet hopefully this will help him with maintaining erection.  Patient was told to take this approximately 45 minutes before activity.  Also after relations have a time where does not be aroused to allow erection to go down

## 2021-02-01 NOTE — Patient Instructions (Signed)

## 2021-02-04 ENCOUNTER — Ambulatory Visit (HOSPITAL_COMMUNITY)
Admission: RE | Admit: 2021-02-04 | Discharge: 2021-02-04 | Disposition: A | Discharge status: Home / Self Care | Payer: BC Managed Care – PPO | Source: Ambulatory Visit | Attending: Interventional Radiology | Admitting: Interventional Radiology

## 2021-02-04 ENCOUNTER — Other Ambulatory Visit: Payer: Self-pay

## 2021-02-04 DIAGNOSIS — J3489 Other specified disorders of nose and nasal sinuses: Secondary | ICD-10-CM | POA: Diagnosis not present

## 2021-02-04 DIAGNOSIS — H93A9 Pulsatile tinnitus, unspecified ear: Secondary | ICD-10-CM | POA: Diagnosis not present

## 2021-02-04 DIAGNOSIS — Z86718 Personal history of other venous thrombosis and embolism: Secondary | ICD-10-CM | POA: Diagnosis not present

## 2021-02-04 DIAGNOSIS — Q283 Other malformations of cerebral vessels: Secondary | ICD-10-CM | POA: Diagnosis not present

## 2021-02-04 MED ORDER — GADOBUTROL 1 MMOL/ML IV SOLN
7.0000 mL | Freq: Once | INTRAVENOUS | Status: AC | PRN
  Administered 2021-02-04: 7 mL via INTRAVENOUS

## 2021-02-07 ENCOUNTER — Other Ambulatory Visit (HOSPITAL_COMMUNITY): Payer: Self-pay | Admitting: Interventional Radiology

## 2021-02-07 DIAGNOSIS — H93A9 Pulsatile tinnitus, unspecified ear: Secondary | ICD-10-CM

## 2021-02-09 DIAGNOSIS — Z125 Encounter for screening for malignant neoplasm of prostate: Secondary | ICD-10-CM | POA: Diagnosis not present

## 2021-02-09 DIAGNOSIS — Z131 Encounter for screening for diabetes mellitus: Secondary | ICD-10-CM | POA: Diagnosis not present

## 2021-02-09 DIAGNOSIS — E785 Hyperlipidemia, unspecified: Secondary | ICD-10-CM | POA: Diagnosis not present

## 2021-02-09 DIAGNOSIS — Z79899 Other long term (current) drug therapy: Secondary | ICD-10-CM | POA: Diagnosis not present

## 2021-02-09 DIAGNOSIS — Z0000 Encounter for general adult medical examination without abnormal findings: Secondary | ICD-10-CM | POA: Diagnosis not present

## 2021-02-10 ENCOUNTER — Telehealth (HOSPITAL_COMMUNITY): Payer: Self-pay

## 2021-02-10 ENCOUNTER — Other Ambulatory Visit: Payer: Self-pay

## 2021-02-10 ENCOUNTER — Ambulatory Visit (HOSPITAL_COMMUNITY)
Admission: RE | Admit: 2021-02-10 | Discharge: 2021-02-10 | Disposition: A | Discharge status: Home / Self Care | Payer: BC Managed Care – PPO | Source: Ambulatory Visit | Attending: Interventional Radiology | Admitting: Interventional Radiology

## 2021-02-10 DIAGNOSIS — G9389 Other specified disorders of brain: Secondary | ICD-10-CM | POA: Diagnosis not present

## 2021-02-10 DIAGNOSIS — H93A1 Pulsatile tinnitus, right ear: Secondary | ICD-10-CM | POA: Diagnosis not present

## 2021-02-10 DIAGNOSIS — H93A9 Pulsatile tinnitus, unspecified ear: Secondary | ICD-10-CM

## 2021-02-10 DIAGNOSIS — Z7902 Long term (current) use of antithrombotics/antiplatelets: Secondary | ICD-10-CM | POA: Diagnosis not present

## 2021-02-10 LAB — BASIC METABOLIC PANEL
BUN/Creatinine Ratio: 13 (ref 9–20)
BUN: 14 mg/dL (ref 6–24)
CO2: 23 mmol/L (ref 20–29)
Calcium: 9.2 mg/dL (ref 8.7–10.2)
Chloride: 101 mmol/L (ref 96–106)
Creatinine, Ser: 1.12 mg/dL (ref 0.76–1.27)
Glucose: 148 mg/dL — ABNORMAL HIGH (ref 70–99)
Potassium: 4.4 mmol/L (ref 3.5–5.2)
Sodium: 140 mmol/L (ref 134–144)
eGFR: 76 mL/min/{1.73_m2} (ref >59)

## 2021-02-10 LAB — MICROALBUMIN / CREATININE URINE RATIO
Creatinine, Urine: 219.8 mg/dL
Microalb/Creat Ratio: 3 mg/g creat (ref 0–29)
Microalbumin, Urine: 5.6 ug/mL

## 2021-02-10 LAB — LIPID PANEL
Chol/HDL Ratio: 7.9 ratio — ABNORMAL HIGH (ref 0.0–5.0)
Cholesterol, Total: 377 mg/dL — ABNORMAL HIGH (ref 100–199)
HDL: 48 mg/dL (ref >39)
LDL Chol Calc (NIH): 303 mg/dL — ABNORMAL HIGH (ref 0–99)
Triglycerides: 135 mg/dL (ref 0–149)
VLDL Cholesterol Cal: 26 mg/dL (ref 5–40)

## 2021-02-10 LAB — HEPATIC FUNCTION PANEL
ALT: 25 IU/L (ref 0–44)
AST: 23 IU/L (ref 0–40)
Albumin: 4.8 g/dL (ref 3.8–4.9)
Alkaline Phosphatase: 126 IU/L — ABNORMAL HIGH (ref 44–121)
Bilirubin Total: 0.3 mg/dL (ref 0.0–1.2)
Bilirubin, Direct: <0.1 mg/dL (ref 0.00–0.40)
Total Protein: 7.7 g/dL (ref 6.0–8.5)

## 2021-02-10 LAB — HEMOGLOBIN A1C
Est. average glucose Bld gHb Est-mCnc: 171 mg/dL
Hgb A1c MFr Bld: 7.6 % — ABNORMAL HIGH (ref 4.8–5.6)

## 2021-02-10 LAB — PSA: Prostate Specific Ag, Serum: 0.8 ng/mL (ref 0.0–4.0)

## 2021-02-10 NOTE — Telephone Encounter (Signed)
Called to move consult up today. Pt will check and call me back in about 5 min. AW

## 2021-02-11 HISTORY — PX: IR RADIOLOGIST EVAL & MGMT: IMG5224

## 2021-02-21 ENCOUNTER — Encounter: Payer: Self-pay | Admitting: Family Medicine

## 2021-02-21 DIAGNOSIS — D369 Benign neoplasm, unspecified site: Secondary | ICD-10-CM

## 2021-02-21 HISTORY — DX: Benign neoplasm, unspecified site: D36.9

## 2021-03-01 ENCOUNTER — Encounter: Payer: Self-pay | Admitting: Family Medicine

## 2021-03-01 ENCOUNTER — Ambulatory Visit (INDEPENDENT_AMBULATORY_CARE_PROVIDER_SITE_OTHER): Payer: BC Managed Care – PPO | Admitting: Family Medicine

## 2021-03-01 ENCOUNTER — Other Ambulatory Visit: Payer: Self-pay

## 2021-03-01 VITALS — BP 120/84 | HR 85 | Temp 97.5°F | Ht 69.25 in | Wt 172.0 lb

## 2021-03-01 DIAGNOSIS — E785 Hyperlipidemia, unspecified: Secondary | ICD-10-CM

## 2021-03-01 DIAGNOSIS — E119 Type 2 diabetes mellitus without complications: Secondary | ICD-10-CM

## 2021-03-01 MED ORDER — FAMOTIDINE 40 MG PO TABS: 40.0000 mg | ORAL_TABLET | Freq: Every day | ORAL | 1 refills | Status: DC

## 2021-03-01 MED ORDER — METFORMIN HCL 500 MG PO TABS: ORAL_TABLET | ORAL | 1 refills | Status: DC

## 2021-03-01 MED ORDER — ROSUVASTATIN CALCIUM 20 MG PO TABS: 20.0000 mg | ORAL_TABLET | Freq: Every day | ORAL | 1 refills | Status: DC

## 2021-03-01 NOTE — Progress Notes (Signed)
   Subjective:    Patient ID: Daniel Burton, male    DOB: 1960-04-22, 60 y.o.   MRN: [id number]  HPI Discuss lab results for a1c and cholesterol results  Patient's A1c elevated Cholesterol elevated Patient is aware of these We discussed them in detail including the risk for heart disease and shorten life The importance of treating with activity diet and medications   Review of Systems     Objective:   Physical Exam General-in no acute distress Eyes-no discharge Lungs-respiratory rate normal, CTA CV-no murmurs,RRR Extremities skin warm dry no edema Neuro grossly normal Behavior normal, alert        Assessment & Plan:  1. Hyperlipidemia, unspecified hyperlipidemia type Start statin plus and minuses discussed If side effect stop follow up Recheck labs in 8 to 10 weeks and fu ov - Hemoglobin A1c - Lipid panel - Hepatic function panel - Basic metabolic panel  2. Type 2 diabetes mellitus without complication, without long-term current use of insulin (HCC) Dietary Metformin 500 one daily Activity Recheck in 8 to 10 weeks Side effects discussed - Hemoglobin A1c - Lipid panel - Hepatic function panel - Basic metabolic panel

## 2021-03-01 NOTE — Patient Instructions (Addendum)
Please do your blood work 1 week before your follow-up visit in February.  If you feel you are having any trouble please let me know.  TakeCare-Dr. Nicki Reaper  Diabetes Mellitus and Nutrition, Adult When you have diabetes, or diabetes mellitus, it is very important to have healthy eating habits because your blood sugar (glucose) levels are greatly affected by what you eat and drink. Eating healthy foods in the right amounts, at about the same times every day, can help you: Manage your blood glucose. Lower your risk of heart disease. Improve your blood pressure. Reach or maintain a healthy weight. What can affect my meal plan? Every person with diabetes is different, and each person has different needs for a meal plan. Your health care provider may recommend that you work with a dietitian to make a meal plan that is best for you. Your meal plan may vary depending on factors such as: The calories you need. The medicines you take. Your weight. Your blood glucose, blood pressure, and cholesterol levels. Your activity level. Other health conditions you have, such as heart or kidney disease. How do carbohydrates affect me? Carbohydrates, also called carbs, affect your blood glucose level more than any other type of food. Eating carbs raises the amount of glucose in your blood. It is important to know how many carbs you can safely have in each meal. This is different for every person. Your dietitian can help you calculate how many carbs you should have at each meal and for each snack. How does alcohol affect me? Alcohol can cause a decrease in blood glucose (hypoglycemia), especially if you use insulin or take certain diabetes medicines by mouth. Hypoglycemia can be a life-threatening condition. Symptoms of hypoglycemia, such as sleepiness, dizziness, and confusion, are similar to symptoms of having too much alcohol. Do not drink alcohol if: Your health care provider tells you not to drink. You are  pregnant, may be pregnant, or are planning to become pregnant. If you drink alcohol: Limit how much you have to: 0-1 drink a day for women. 0-2 drinks a day for men. Know how much alcohol is in your drink. In the [place] one drink equals one 12 oz bottle of beer (355 mL), one 5 oz glass of wine (148 mL), or one 1 oz glass of hard liquor (44 mL). Keep yourself hydrated with water, diet soda, or unsweetened iced tea. Keep in mind that regular soda, juice, and other mixers may contain a lot of sugar and must be counted as carbs. What are tips for following this plan? Reading food labels Start by checking the serving size on the Nutrition Facts label of packaged foods and drinks. The number of calories and the amount of carbs, fats, and other nutrients listed on the label are based on one serving of the item. Many items contain more than one serving per package. Check the total grams (g) of carbs in one serving. Check the number of grams of saturated fats and trans fats in one serving. Choose foods that have a low amount or none of these fats. Check the number of milligrams (mg) of salt (sodium) in one serving. Most people should limit total sodium intake to less than 2,300 mg per day. Always check the nutrition information of foods labeled as "low-fat" or "nonfat." These foods may be higher in added sugar or refined carbs and should be avoided. Talk to your dietitian to identify your daily goals for nutrients listed on the label. Shopping Avoid buying canned,  pre-made, or processed foods. These foods tend to be high in fat, sodium, and added sugar. Shop around the outside edge of the grocery store. This is where you will most often find fresh fruits and vegetables, bulk grains, fresh meats, and fresh dairy products. Cooking Use low-heat cooking methods, such as baking, instead of high-heat cooking methods, such as deep frying. Cook using healthy oils, such as olive, canola, or sunflower oil. Avoid  cooking with butter, cream, or high-fat meats. Meal planning Eat meals and snacks regularly, preferably at the same times every day. Avoid going long periods of time without eating. Eat foods that are high in fiber, such as fresh fruits, vegetables, beans, and whole grains. Eat 4-6 oz (112-168 g) of lean protein each day, such as lean meat, chicken, fish, eggs, or tofu. One ounce (oz) (28 g) of lean protein is equal to: 1 oz (28 g) of meat, chicken, or fish. 1 egg.  cup (62 g) of tofu. Eat some foods each day that contain healthy fats, such as avocado, nuts, seeds, and fish. What foods should I eat? Fruits Berries. Apples. Oranges. Peaches. Apricots. Plums. Grapes. Mangoes. Papayas. Pomegranates. Kiwi. Cherries. Vegetables Leafy greens, including lettuce, spinach, kale, chard, collard greens, mustard greens, and cabbage. Beets. Cauliflower. Broccoli. Carrots. Green beans. Tomatoes. Peppers. Onions. Cucumbers. Brussels sprouts. Grains Whole grains, such as whole-wheat or whole-grain bread, crackers, tortillas, cereal, and pasta. Unsweetened oatmeal. Quinoa. Brown or wild rice. Meats and other proteins Seafood. Poultry without skin. Lean cuts of poultry and beef. Tofu. Nuts. Seeds. Dairy Low-fat or fat-free dairy products such as milk, yogurt, and cheese. The items listed above may not be a complete list of foods and beverages you can eat and drink. Contact a dietitian for more information. What foods should I avoid? Fruits Fruits canned with syrup. Vegetables Canned vegetables. Frozen vegetables with butter or cream sauce. Grains Refined white flour and flour products such as bread, pasta, snack foods, and cereals. Avoid all processed foods. Meats and other proteins Fatty cuts of meat. Poultry with skin. Breaded or fried meats. Processed meat. Avoid saturated fats. Dairy Full-fat yogurt, cheese, or milk. Beverages Sweetened drinks, such as soda or iced tea. The items listed above  may not be a complete list of foods and beverages you should avoid. Contact a dietitian for more information. Questions to ask a health care provider Do I need to meet with a certified diabetes care and education specialist? Do I need to meet with a dietitian? What number can I call if I have questions? When are the best times to check my blood glucose? Where to find more information: American Diabetes Association: diabetes.org Academy of Nutrition and Dietetics: eatright.[employer] of Diabetes and Digestive and Kidney Diseases: [employer] Association of Diabetes Care & Education Specialists: diabeteseducator.org Summary It is important to have healthy eating habits because your blood sugar (glucose) levels are greatly affected by what you eat and drink. It is important to use alcohol carefully. A healthy meal plan will help you manage your blood glucose and lower your risk of heart disease. Your health care provider may recommend that you work with a dietitian to make a meal plan that is best for you. This information is not intended to replace advice given to you by your health care provider. Make sure you discuss any questions you have with your health care provider. Document Revised: 10/15/2019 Document Reviewed: 10/15/2019 Elsevier Patient Education  [place].

## 2021-03-25 ENCOUNTER — Other Ambulatory Visit: Payer: Self-pay

## 2021-03-25 ENCOUNTER — Telehealth: Payer: Self-pay

## 2021-03-25 ENCOUNTER — Other Ambulatory Visit (HOSPITAL_COMMUNITY)
Admission: RE | Admit: 2021-03-25 | Discharge: 2021-03-25 | Disposition: A | Discharge status: Home / Self Care | Payer: BC Managed Care – PPO | Source: Ambulatory Visit | Attending: Family Medicine | Admitting: Family Medicine

## 2021-03-25 ENCOUNTER — Ambulatory Visit: Payer: BC Managed Care – PPO | Admitting: Family Medicine

## 2021-03-25 DIAGNOSIS — M79602 Pain in left arm: Secondary | ICD-10-CM | POA: Diagnosis not present

## 2021-03-25 DIAGNOSIS — G542 Cervical root disorders, not elsewhere classified: Secondary | ICD-10-CM

## 2021-03-25 LAB — TROPONIN I (HIGH SENSITIVITY): Troponin I (High Sensitivity): 5 ng/L (ref <18)

## 2021-03-25 NOTE — Telephone Encounter (Signed)
Patient has been informed of his normal troponin lab results per drs verbal orders.

## 2021-03-25 NOTE — Progress Notes (Signed)
° °  Subjective:    Patient ID: Daniel Burton, male    DOB: 10-19-1960, 60 y.o.   MRN: [id number]  HPI  Left shoulder and arm pain Left shoulder pain arm pain He describes a pain that goes across her trapezius into the shoulder He describes going down the arm into the hand Describes as a numbness tingling feeling He has had this before He has had previous neck surgery with Dr. Ronnald Ramp [facility]surgery [facility] He states the tingling comes across his left pectoral upper part of the chest and even causes the skin to be sore.  He denies substernal chest tightness pressure or pain Denies shortness of breath or sweats EKG does not show any acute changes Review of Systems     Objective:   Physical Exam Lungs are clear heart regular pulse normal strength in both arms good       Assessment & Plan:  Cervical impingement Anti-inflammatory Stretches If not doing better by next week notify us May need further work-up with cervical imaging EKG normal with no acute changes Unlikely to be heart disease but we will go ahead and do troponin Patient to follow-up if worse Go to ER if any severe issues Troponin came back negative patient was informed

## 2021-05-03 ENCOUNTER — Ambulatory Visit: Payer: BC Managed Care – PPO | Admitting: Family Medicine

## 2021-05-09 ENCOUNTER — Telehealth: Payer: Self-pay | Admitting: Family Medicine

## 2021-05-09 NOTE — Telephone Encounter (Signed)
Patient advised per Dr Nicki Reaper: Obviously if patient is having excruciating headaches with vomiting blurred vision double vision etc. would we would need evaluation here or ER immediately otherwise if it is more pains that come and go Dr Nicki Reaper would recommend stopping rosuvastatin.  That is the most likely cause.  Dr Nicki Reaper would continue the metformin.   Please have patient do his labs from December that includes A1c lipid liver etc.  Patient verbalized understanding and stated he will try to have lab work this week. Patient states it is just pains that come and go with no severe pain, vomiting or vision issues.

## 2021-05-09 NOTE — Telephone Encounter (Signed)
Obviously if patient is having excruciating headaches with vomiting blurred vision double vision etc. would we would need evaluation here or ER immediately otherwise if it is more pains that come and go I would recommend stopping rosuvastatin.  That is the most likely cause.  I would continue the metformin.  Please have patient do his labs from December that includes A1c lipid liver etc.  This is not the troponin test.  Make sure patient is specific with [facility] so they do not draw the wrong test Please see his ordered labs

## 2021-05-09 NOTE — Telephone Encounter (Signed)
Left message to return call 

## 2021-05-09 NOTE — Telephone Encounter (Signed)
Pt came in and stated he takes Metformin and Rosuvastin Calcium since the first of January. Over the weekend he stated he started having pains in his head and wasn't sure if it was the medication. Would like a call from the nurse. He has a f/u 05/18/21 at 11:00 am.  [phone]

## 2021-05-13 DIAGNOSIS — E119 Type 2 diabetes mellitus without complications: Secondary | ICD-10-CM | POA: Diagnosis not present

## 2021-05-13 DIAGNOSIS — E785 Hyperlipidemia, unspecified: Secondary | ICD-10-CM | POA: Diagnosis not present

## 2021-05-14 LAB — LIPID PANEL
Chol/HDL Ratio: 3.8 ratio (ref 0.0–5.0)
Cholesterol, Total: 224 mg/dL — ABNORMAL HIGH (ref 100–199)
HDL: 59 mg/dL (ref >39)
LDL Chol Calc (NIH): 154 mg/dL — ABNORMAL HIGH (ref 0–99)
Triglycerides: 63 mg/dL (ref 0–149)
VLDL Cholesterol Cal: 11 mg/dL (ref 5–40)

## 2021-05-14 LAB — BASIC METABOLIC PANEL
BUN/Creatinine Ratio: 15 (ref 10–24)
BUN: 15 mg/dL (ref 8–27)
CO2: 24 mmol/L (ref 20–29)
Calcium: 9.5 mg/dL (ref 8.6–10.2)
Chloride: 103 mmol/L (ref 96–106)
Creatinine, Ser: 1 mg/dL (ref 0.76–1.27)
Glucose: 97 mg/dL (ref 70–99)
Potassium: 4 mmol/L (ref 3.5–5.2)
Sodium: 140 mmol/L (ref 134–144)
eGFR: 86 mL/min/{1.73_m2} (ref >59)

## 2021-05-14 LAB — HEPATIC FUNCTION PANEL
ALT: 17 IU/L (ref 0–44)
AST: 24 IU/L (ref 0–40)
Albumin: 4.9 g/dL (ref 3.8–4.9)
Alkaline Phosphatase: 121 IU/L (ref 44–121)
Bilirubin Total: 0.5 mg/dL (ref 0.0–1.2)
Bilirubin, Direct: 0.13 mg/dL (ref 0.00–0.40)
Total Protein: 7.4 g/dL (ref 6.0–8.5)

## 2021-05-14 LAB — HEMOGLOBIN A1C
Est. average glucose Bld gHb Est-mCnc: 177 mg/dL
Hgb A1c MFr Bld: 7.8 % — ABNORMAL HIGH (ref 4.8–5.6)

## 2021-05-16 ENCOUNTER — Other Ambulatory Visit (HOSPITAL_COMMUNITY): Payer: Self-pay | Admitting: Interventional Radiology

## 2021-05-18 ENCOUNTER — Encounter: Payer: Self-pay | Admitting: Family Medicine

## 2021-05-18 ENCOUNTER — Ambulatory Visit: Payer: BC Managed Care – PPO | Admitting: Family Medicine

## 2021-05-18 ENCOUNTER — Other Ambulatory Visit: Payer: Self-pay

## 2021-05-18 VITALS — BP 110/70 | Temp 98.2°F | Wt 164.0 lb

## 2021-05-18 DIAGNOSIS — E1169 Type 2 diabetes mellitus with other specified complication: Secondary | ICD-10-CM | POA: Diagnosis not present

## 2021-05-18 DIAGNOSIS — E119 Type 2 diabetes mellitus without complications: Secondary | ICD-10-CM

## 2021-05-18 DIAGNOSIS — E785 Hyperlipidemia, unspecified: Secondary | ICD-10-CM

## 2021-05-18 DIAGNOSIS — Z79899 Other long term (current) drug therapy: Secondary | ICD-10-CM

## 2021-05-18 MED ORDER — METFORMIN HCL 500 MG PO TABS: ORAL_TABLET | ORAL | 1 refills | Status: DC

## 2021-05-18 MED ORDER — ROSUVASTATIN CALCIUM 20 MG PO TABS: ORAL_TABLET | ORAL | 1 refills | Status: DC

## 2021-05-18 NOTE — Progress Notes (Signed)
° °  Subjective:    Patient ID: Daniel Burton, male    DOB: [birth year], 61 y.o.   MRN: [id number]  HPI Pt here to follow up on cholesterol. Pt states one of his meds (either Metformin or Crestor) is making his head hurt.  He states intermittently does have some very mild headaches.  Not severe he is not sure if it could be related to change in eating habits or possibly related to his statin we did stop the statin he states he has not had much in the way of any headaches but just an occasional discomfort We did discuss how very important to get cholesterol under good control also very important to get A1c under good control to reduce her risk of heart attack strokes   Review of Systems     Objective:   Physical Exam Lungs clear heart regular pulse normal BP good       Assessment & Plan:  Diabetes fair control bump up dose of Glucophage to twice daily.  Goal is to get A1c at 7 or less  Cholesterol profile very good compared to where it was but needs to be better we did discuss adjusting medication he would like to work harder at diet and continue medicine we will recheck this again in 3 months potentially changing it at that time  Follow-up 3 months

## 2021-05-31 ENCOUNTER — Other Ambulatory Visit (HOSPITAL_COMMUNITY): Payer: Self-pay | Admitting: Interventional Radiology

## 2021-05-31 ENCOUNTER — Telehealth (HOSPITAL_COMMUNITY): Payer: Self-pay

## 2021-05-31 DIAGNOSIS — H93A9 Pulsatile tinnitus, unspecified ear: Secondary | ICD-10-CM

## 2021-05-31 NOTE — Telephone Encounter (Signed)
Called to schedule mrv, no answer, left vm. AW  

## 2021-06-01 ENCOUNTER — Telehealth (HOSPITAL_COMMUNITY): Payer: Self-pay

## 2021-06-01 ENCOUNTER — Ambulatory Visit (INDEPENDENT_AMBULATORY_CARE_PROVIDER_SITE_OTHER): Payer: BC Managed Care – PPO | Admitting: Family Medicine

## 2021-06-01 ENCOUNTER — Other Ambulatory Visit: Payer: Self-pay

## 2021-06-01 VITALS — BP 132/66 | HR 79 | Temp 99.1°F | Wt 164.6 lb

## 2021-06-01 DIAGNOSIS — B349 Viral infection, unspecified: Secondary | ICD-10-CM

## 2021-06-01 NOTE — Telephone Encounter (Signed)
Pt will call back to schedule mri once he is feeling better. AW  ?

## 2021-06-01 NOTE — Progress Notes (Signed)
? ?  Subjective:  ? ? Patient ID: Daniel Burton, male    DOB: 1960/12/18, 61 y.o.   MRN: [id number] ? ?HPI ? ?Patient says he is having a little sore throat, feels a little weak and little cough.  He has been sick since Monday. He has been taking Thera-flu. ?Relates some fatigue tiredness feeling rundown. ?Review of Systems ? ?   ?Objective:  ? Physical Exam ? ?General-in no acute distress ?Eyes-no discharge ?Lungs-respiratory rate normal, CTA ?CV-no murmurs,RRR ?Extremities skin warm dry no edema ?Neuro grossly normal ?Behavior normal, alert ? ? ? ?   ?Assessment & Plan:  ?Viral syndrome ?COVID testing recommended ?Pt defers ?He will do home test and let us know ?If worse follow up ? ?

## 2021-06-13 ENCOUNTER — Telehealth (HOSPITAL_COMMUNITY): Payer: Self-pay

## 2021-06-13 NOTE — Telephone Encounter (Signed)
Pt called to move mri up. We didn't have anything sooner. I will keep checking and call if they have a cancellation. AW ?

## 2021-06-15 ENCOUNTER — Telehealth: Payer: Self-pay | Admitting: Family Medicine

## 2021-06-15 DIAGNOSIS — E1169 Type 2 diabetes mellitus with other specified complication: Secondary | ICD-10-CM

## 2021-06-15 DIAGNOSIS — E119 Type 2 diabetes mellitus without complications: Secondary | ICD-10-CM

## 2021-06-15 NOTE — Telephone Encounter (Signed)
Pt requesting refrral to Dr.Nida. Pt states he saw Dr.Nida on TV and he was talking about "lowering cholesterol and all that". Please advise. Thank you ?

## 2021-06-16 NOTE — Telephone Encounter (Signed)
Nurses ?Please let the patient know that on his last visit we discussed trying to get his cholesterol under control and diabetes with medications as well as healthy diet.  At that time he was resistant to being on medication for his cholesterol. ?If it takes seeing a specialist in order to help him get to the point of treating his cholesterol I support it 100% ?Please let the patient know I am perfectly fine if you would like to see Dr. Dorris Fetch. ?Dr. Dorris Fetch at that point would manage his diabetes and his cholesterol ?So if you would like to go ahead with that referral that will be fine and he would follow through with Dr. Dorris Fetch regarding all of these issues ?He would follow through with [place] with other health issues ?Please see what he would like to do and move forward ?

## 2021-06-16 NOTE — Addendum Note (Signed)
Addended by: Dairl Ponder on: 06/16/2021 10:00 AM ? ? Modules accepted: Orders ? ?

## 2021-06-16 NOTE — Telephone Encounter (Signed)
Referral ordered in Epic. Patient notified. 

## 2021-06-20 ENCOUNTER — Other Ambulatory Visit: Payer: Self-pay

## 2021-06-20 ENCOUNTER — Encounter: Payer: Self-pay | Admitting: "Endocrinology

## 2021-06-20 ENCOUNTER — Ambulatory Visit: Payer: BC Managed Care – PPO | Admitting: "Endocrinology

## 2021-06-20 VITALS — BP 108/80 | HR 68 | Ht 69.25 in | Wt 162.2 lb

## 2021-06-20 DIAGNOSIS — E782 Mixed hyperlipidemia: Secondary | ICD-10-CM | POA: Diagnosis not present

## 2021-06-20 DIAGNOSIS — E1165 Type 2 diabetes mellitus with hyperglycemia: Secondary | ICD-10-CM | POA: Diagnosis not present

## 2021-06-20 DIAGNOSIS — Z794 Long term (current) use of insulin: Secondary | ICD-10-CM | POA: Diagnosis not present

## 2021-06-20 NOTE — Progress Notes (Signed)
? ?                                                             Endocrinology Consult Note  ?     06/20/2021, 4:55 PM ? ? ?Subjective:  ? ? Patient ID: Daniel Burton, male    DOB: [birth year].  ?Daniel Burton is being seen in consultation for management of currently uncontrolled symptomatic diabetes requested by  Kathyrn Drown, MD. ? ? ?Past Medical History:  ?Diagnosis Date  ? Back pain with history of spinal surgery   ? High cholesterol   ? Tinnitus of right ear 10/18/2020  ? jugular bulb  ? Tubular adenoma 02/21/2021  ? Tubular adenoma, sessile serrated adenoma, colonoscopy, [facility],Dr.Brahmbhatt, recommended to have repeat colonoscopy May 2023  ? ? ?Past Surgical History:  ?Procedure Laterality Date  ? CERVICAL SPINE SURGERY  2021  ? ELBOW SURGERY Right 1979  ? HERNIA REPAIR    ? IR ANGIO INTRA EXTRACRAN SEL COM CAROTID INNOMINATE BILAT MOD SED  06/25/2020  ? IR ANGIO INTRA EXTRACRAN SEL COM CAROTID INNOMINATE UNI R MOD SED  10/18/2020  ? IR ANGIO VERTEBRAL SEL SUBCLAVIAN INNOMINATE BILAT MOD SED  06/25/2020  ? IR CT HEAD LTD  10/18/2020  ? IR RADIOLOGIST EVAL & MGMT  07/13/2020  ? IR RADIOLOGIST EVAL & MGMT  09/10/2020  ? IR RADIOLOGIST EVAL & MGMT  11/03/2020  ? IR RADIOLOGIST EVAL & MGMT  12/03/2020  ? IR RADIOLOGIST EVAL & MGMT  02/11/2021  ? IR TRANSCATH PLC STENT  INITIAL VEIN  INC ANGIOPLASTY  10/18/2020  ? IR US GUIDE VASC ACCESS RIGHT  10/18/2020  ? IR VENO/JUGULAR RIGHT  10/18/2020  ? RADIOLOGY WITH ANESTHESIA N/A 10/18/2020  ? Procedure: EMOBLIZATION;  Surgeon: Luanne Bras, MD;  Location: [place];  Service: Radiology;  Laterality: N/A;  ? ? ?Social History  ? ?Socioeconomic History  ? Marital status: Married  ?  Spouse name: Not on file  ? Number of children: 2  ? Years of education: 75  ? Highest education level: Not on file  ?Occupational History  ? Not on file  ?Tobacco Use  ? Smoking status: Former  ?  Types: Cigarettes  ? Smokeless tobacco: Former  ?Vaping Use  ? Vaping Use: Never  used  ?Substance and Sexual Activity  ? Alcohol use: No  ? Drug use: Not Currently  ? Sexual activity: Not on file  ?Other Topics Concern  ? Not on file  ?Social History Narrative  ? Lives at home with wife  ? Right handed  ? Caffeine: tea occasionally   ? ?Social Determinants of Health  ? ?Financial Resource Strain: Not on file  ?Food Insecurity: Not on file  ?Transportation Needs: Not on file  ?Physical Activity: Not on file  ?Stress: Not on file  ?Social Connections: Not on file  ? ? ?Family History  ?Problem Relation Age of Onset  ? Diabetes Mother   ? Heart attack Mother   ? Throat cancer Father   ? Liver cancer Sister   ? Thyroid disease Sister   ? Diabetes Brother   ? Allergic rhinitis Neg Hx   ? Angioedema Neg Hx   ? Asthma Neg Hx   ? Urticaria Neg Hx   ? ? ?  Outpatient Encounter Medications as of 06/20/2021  ?Medication Sig  ? ibuprofen (ADVIL) 200 MG tablet Take 200 mg by mouth every 6 (six) hours as needed for headache or moderate pain.  ? aspirin EC 81 MG tablet Take 1 tablet (81 mg total) by mouth daily. Swallow whole.  ? clopidogrel (PLAVIX) 75 MG tablet Take 1 tablet (75 mg total) by mouth daily.  ? metFORMIN (GLUCOPHAGE) 500 MG tablet One BID  ? rosuvastatin (CRESTOR) 20 MG tablet One each evening  ? [DISCONTINUED] acetaZOLAMIDE (DIAMOX) 125 MG tablet Take 1 tablet (125 mg total) by mouth 2 (two) times daily. (Patient not taking: Reported on 06/01/2021)  ? [DISCONTINUED] famotidine (PEPCID) 40 MG tablet Take 1 tablet (40 mg total) by mouth daily. (Patient not taking: Reported on 06/01/2021)  ? [DISCONTINUED] pantoprazole (PROTONIX) 40 MG tablet Take 40 mg by mouth daily. (Patient not taking: Reported on 06/01/2021)  ? ?No facility-administered encounter medications on file as of 06/20/2021.  ? ? ?ALLERGIES: ?No Known Allergies ? ?VACCINATION STATUS: ?Immunization History  ?Administered Date(s) Administered  ? Influenza-Unspecified 01/27/2019  ? PFIZER(Purple Top)SARS-COV-2 Vaccination 10/09/2019, 11/04/2019   ? ? ?Diabetes ?He presents for his initial diabetic visit. He has type 2 diabetes mellitus. Onset time: He was diagnosed at approximate age of 54 years. There are no hypoglycemic associated symptoms. Pertinent negatives for hypoglycemia include no confusion, headaches, pallor or seizures. There are no diabetic associated symptoms. Pertinent negatives for diabetes include no chest pain, no fatigue, no polydipsia, no polyphagia, no polyuria and no weakness. There are no hypoglycemic complications. Symptoms are stable. Risk factors for coronary artery disease include diabetes mellitus, dyslipidemia, male sex, sedentary lifestyle and family history. Current diabetic treatments: He is currently on metformin 500 mg p.o. twice daily. His weight is fluctuating minimally. He is following a generally unhealthy diet. When asked about meal planning, he reported none. He has not had a previous visit with a dietitian. He participates in exercise intermittently. (He does not monitor blood glucose regularly.  His recent A1c was 7.8%.) An ACE inhibitor/angiotensin II receptor blocker is not being taken.  ?Hyperlipidemia ?This is a chronic problem. The current episode started more than 1 year ago. The problem is uncontrolled. Exacerbating diseases include diabetes. Pertinent negatives include no chest pain, myalgias or shortness of breath. Current antihyperlipidemic treatment includes statins. Risk factors for coronary artery disease include dyslipidemia, diabetes mellitus, male sex, a sedentary lifestyle and family history.  ? ? ?Review of Systems  ?Constitutional:  Negative for chills, fatigue, fever and unexpected weight change.  ?HENT:  Negative for dental problem, mouth sores and trouble swallowing.   ?Eyes:  Negative for visual disturbance.  ?Respiratory:  Negative for cough, choking, chest tightness, shortness of breath and wheezing.   ?Cardiovascular:  Negative for chest pain, palpitations and leg swelling.   ?Gastrointestinal:  Negative for abdominal distention, abdominal pain, constipation, diarrhea, nausea and vomiting.  ?Endocrine: Negative for polydipsia, polyphagia and polyuria.  ?Genitourinary:  Negative for dysuria, flank pain, hematuria and urgency.  ?Musculoskeletal:  Negative for back pain, gait problem, myalgias and neck pain.  ?Skin:  Negative for pallor, rash and wound.  ?Neurological:  Negative for seizures, syncope, weakness, numbness and headaches.  ?Psychiatric/Behavioral:  Negative for confusion and dysphoric mood.   ? ?Objective:  ?  ? ?  06/20/2021  ?  1:04 PM 06/01/2021  ?  2:50 PM 05/18/2021  ? 10:58 AM  ?Vitals with BMI  ?Height 5' 9.25"    ?Weight 162 lbs 3 oz  164 lbs 10 oz 164 lbs  ?BMI 23.78    ?Systolic 102 725 366  ?Diastolic 80 66 70  ?Pulse 68 79   ? ? ?BP 108/80   Pulse 68   Ht 5' 9.25" (1.759 m)   Wt 162 lb 3.2 oz (73.6 kg)   BMI 23.78 kg/m?   ?Wt Readings from Last 3 Encounters:  ?06/20/21 162 lb 3.2 oz (73.6 kg)  ?06/01/21 164 lb 9.6 oz (74.7 kg)  ?05/18/21 164 lb (74.4 kg)  ?  ? ?Physical Exam ?Constitutional:   ?   General: He is not in acute distress. ?   Appearance: He is well-developed.  ?HENT:  ?   Head: Normocephalic and atraumatic.  ?Neck:  ?   Thyroid: No thyromegaly.  ?   Trachea: No tracheal deviation.  ?Cardiovascular:  ?   Rate and Rhythm: Normal rate.  ?   Pulses:     ?     Dorsalis pedis pulses are 1+ on the right side and 1+ on the left side.  ?     Posterior tibial pulses are 1+ on the right side and 1+ on the left side.  ?   Heart sounds: S1 normal and S2 normal. No murmur heard. ?  No gallop.  ?Pulmonary:  ?   Effort: Pulmonary effort is normal. No respiratory distress.  ?   Breath sounds: No wheezing.  ?Abdominal:  ?   General: There is no distension.  ?   Tenderness: There is no abdominal tenderness. There is no guarding.  ?Musculoskeletal:  ?   Right shoulder: No swelling or deformity.  ?   Cervical back: Normal range of motion and neck supple.  ?Skin: ?    General: Skin is warm and dry.  ?   Nails: There is no clubbing.  ?Neurological:  ?   Mental Status: He is alert and oriented to person, place, and time.  ?   Cranial Nerves: No cranial nerve deficit.  ?   Sensory: No sensory de

## 2021-06-20 NOTE — Patient Instructions (Signed)

## 2021-06-21 ENCOUNTER — Ambulatory Visit (HOSPITAL_COMMUNITY)
Admission: RE | Admit: 2021-06-21 | Discharge: 2021-06-21 | Disposition: A | Discharge status: Home / Self Care | Payer: BC Managed Care – PPO | Source: Ambulatory Visit | Attending: Interventional Radiology | Admitting: Interventional Radiology

## 2021-06-21 DIAGNOSIS — H93A9 Pulsatile tinnitus, unspecified ear: Secondary | ICD-10-CM | POA: Diagnosis not present

## 2021-06-21 IMAGING — MR MR MRV HEAD WO/W CM
4 of 5 series · 19 of 48 positions shown · IV contrast (gadavist)
Comparison: [DATE] MRV and IR CT head [DATE]

CLINICAL DATA: Pulsatile tinnitus

EXAM:
MR VENOGRAM HEAD WITHOUT AND WITH CONTRAST
TECHNIQUE: Angiographic images of the intracranial venous structures were
acquired using MRV technique without and with intravenous contrast.
CONTRAST:  7.5mL GADAVIST GADOBUTROL 1 MMOL/ML IV SOLN

[Series 2: MRV · coronal · 1.5mm · 0.43mm/px · 5 of 121 slices shown]
[im 1/121]
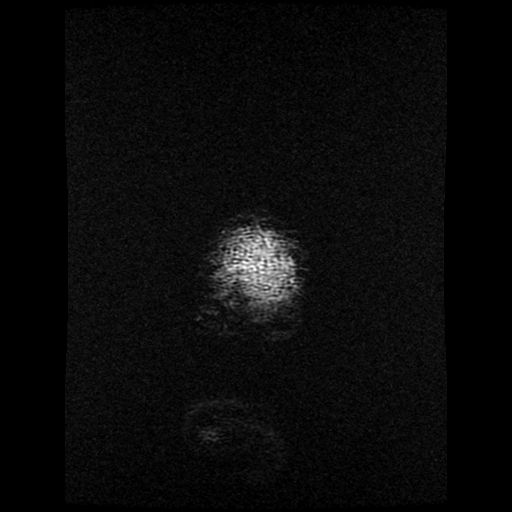
[im 31/121]
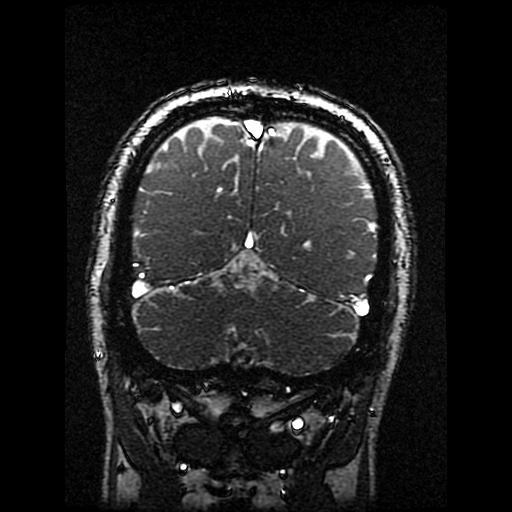
[im 61/121]
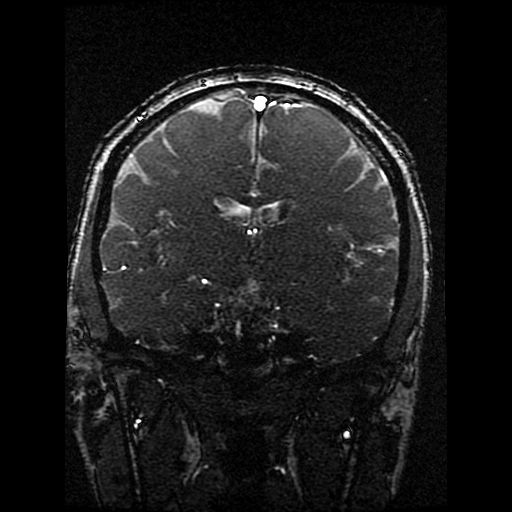
[im 91/121]
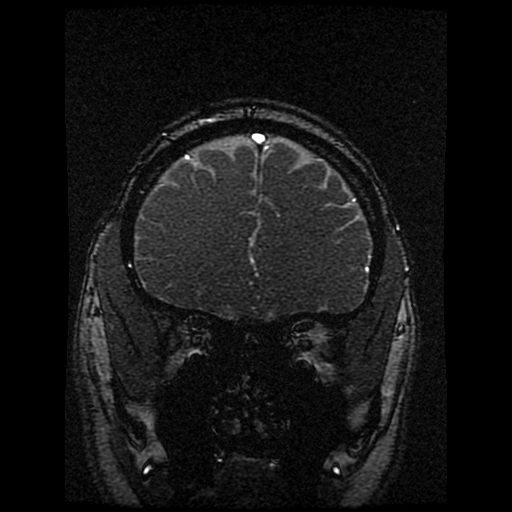
[im 121/121]
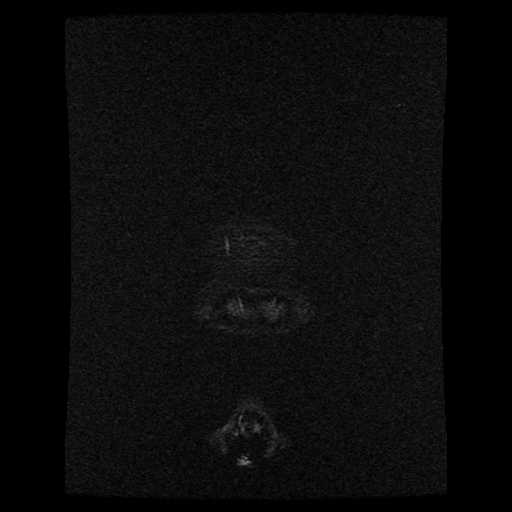

[Series 3: sag inhance (id) · sagittal · 1.8mm · 0.47mm/px · 8 of 371 slices shown]
[im 25/371]
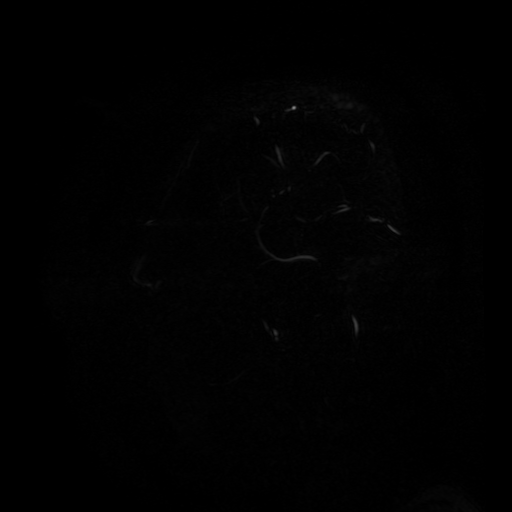
[im 50/371]
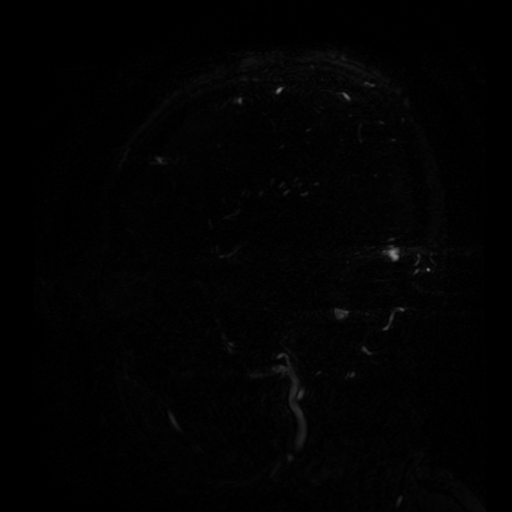
[im 75/371]
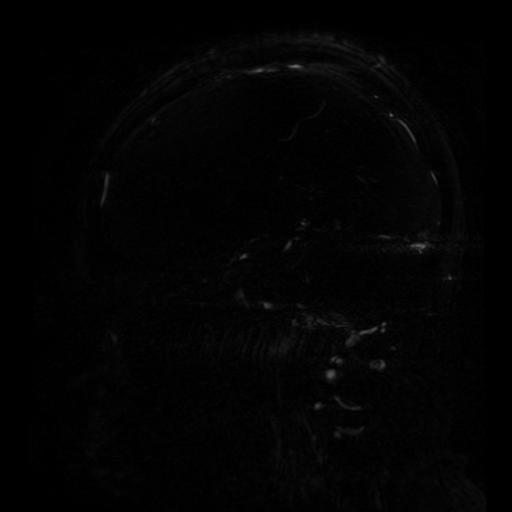
[im 124/371]
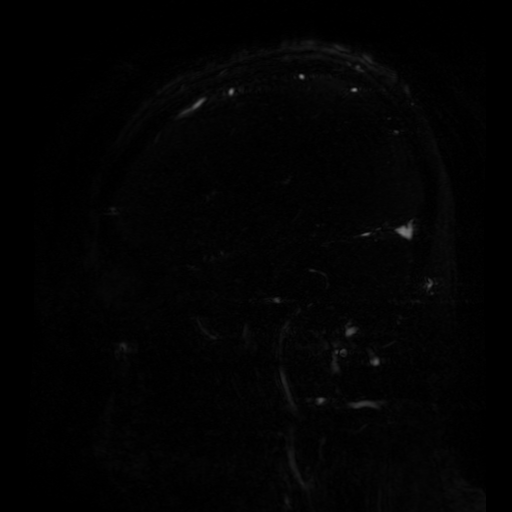
[im 173/371]
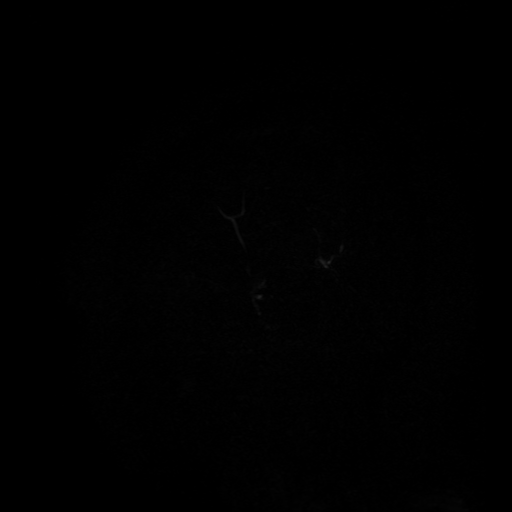
[im 198/371]
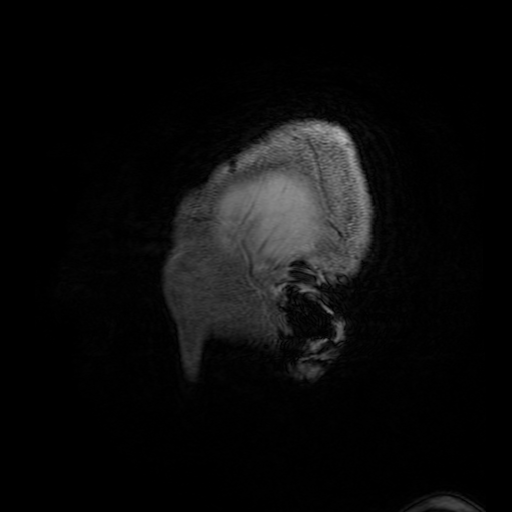
[im 223/371]
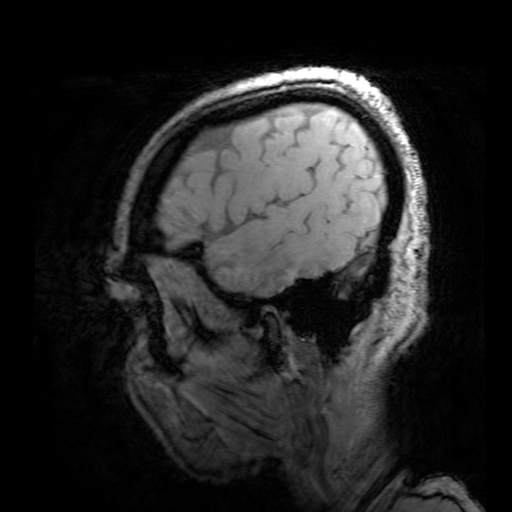
[im 321/371]
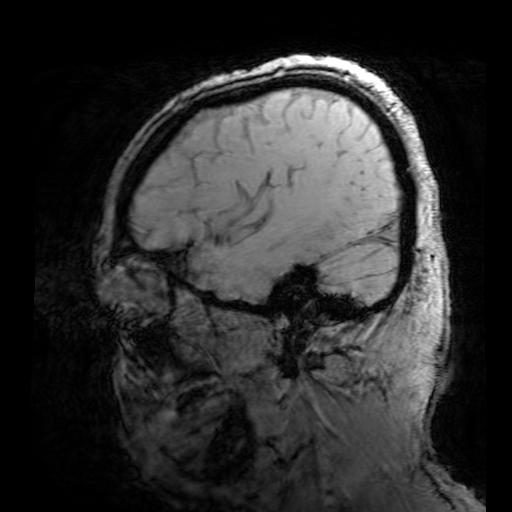

[Series 400: multiplanar reconstruction (mpr) · sagittal · 0.9mm · 0.47mm/px · 3 of 204 slices shown (1 of 2)]
[im 26/204]
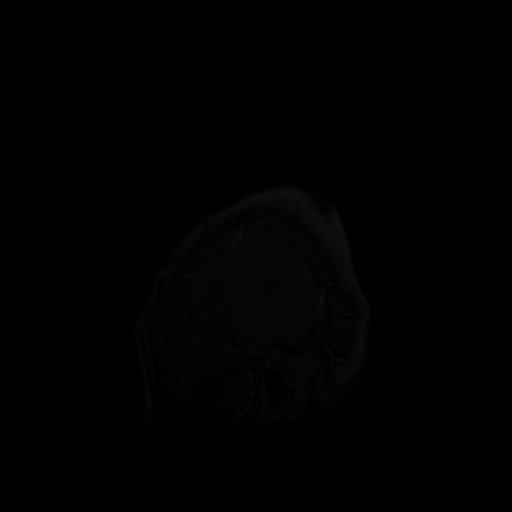
[im 102/204]
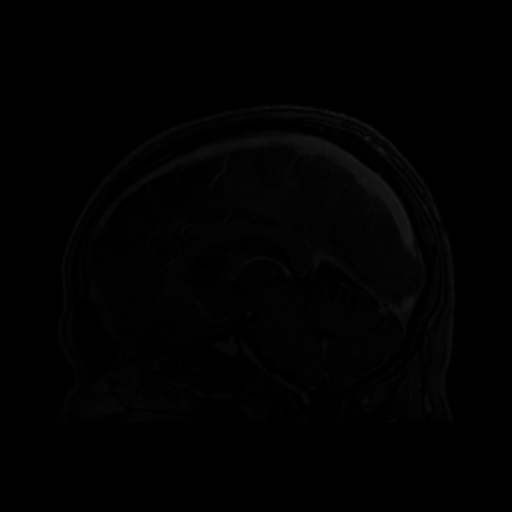
[im 178/204]
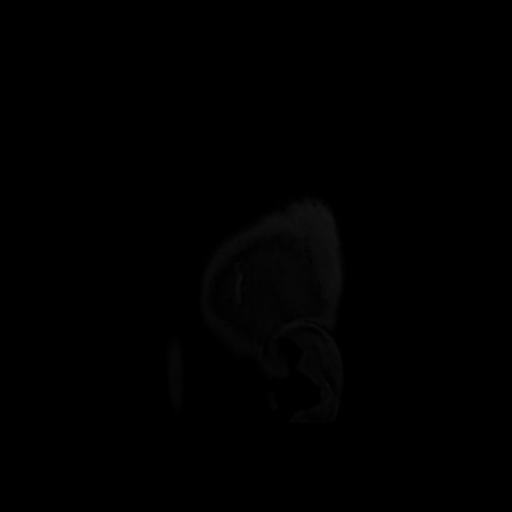

[Series 401: multiplanar reconstruction (mpr) · coronal · 0.9mm · 0.47mm/px · 3 of 255 slices shown (2 of 2)]
[im 26/255]
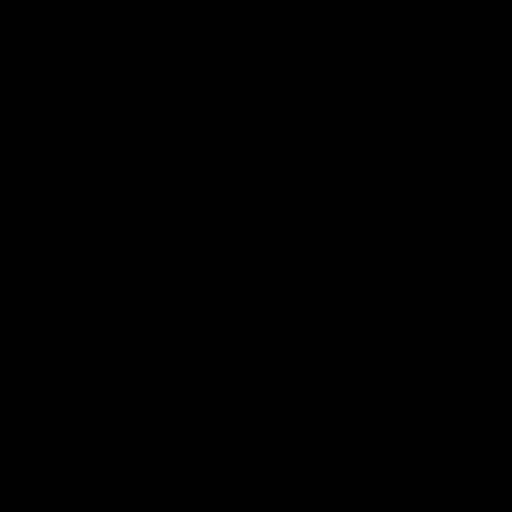
[im 128/255]
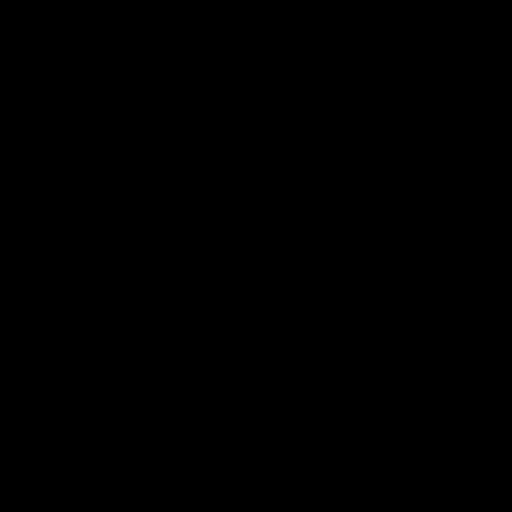
[im 229/255]
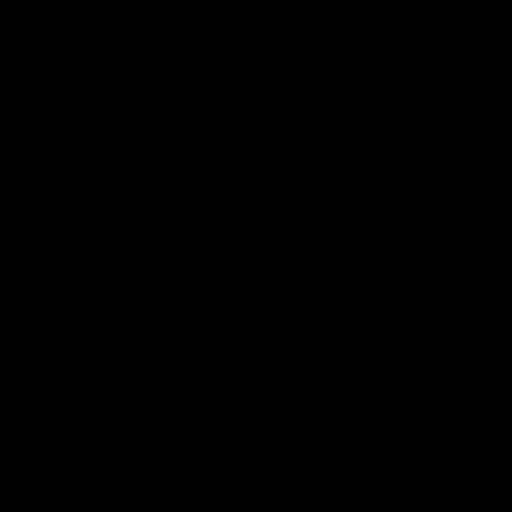

[19 of 48 positions shown; findings below may reference images not displayed]

FINDINGS: Status post stenting of the right sigmoid sinus and internal jugular
vein at the skull base. Susceptibility artifact from the stent
limits evaluation; however, contrast is not definitively seen within
the stent proximally but is noted just distal to the jugular foramen
(series 4, image 11 and series 400, image 89). Given the patency of
the distal stent, the absence of definitive contrast in the proximal
stent is felt to be artifactual.

Superior sagittal sinus: Patent.

Straight sinus: Patent.

Inferior sagittal sinus, vein of BLAIN and internal cerebral veins:
Patent.

Transverse sinuses: Patent.

Sigmoid sinuses: Patent on the left. Patent proximally on the right.

Visualized jugular veins: Patent on the left. Patent distally on the
right.
IMPRESSION: 1. Status post stenting of the right sigmoid sinus and internal
jugular vein at the skull base. Evaluation of stent patency is
limited by susceptibility artifact; however contrast is noted within
the stent from the level of the jugular foramen. Given the distal
stent patency, the absence of visualized contrast in the proximal
stent is favored to be artifactual, related to degree of
susceptibility artifact. A CTA may be helpful for further
evaluation.
2. Otherwise patent venous sinuses.

## 2021-06-21 MED ORDER — GADOBUTROL 1 MMOL/ML IV SOLN
7.5000 mL | Freq: Once | INTRAVENOUS | Status: AC | PRN
  Administered 2021-06-21: 7.5 mL via INTRAVENOUS

## 2021-06-24 DIAGNOSIS — Z86010 Personal history of colonic polyps: Secondary | ICD-10-CM | POA: Diagnosis not present

## 2021-07-01 ENCOUNTER — Encounter (HOSPITAL_COMMUNITY): Payer: Self-pay | Admitting: *Deleted

## 2021-07-01 ENCOUNTER — Emergency Department (HOSPITAL_COMMUNITY): Payer: No Typology Code available for payment source

## 2021-07-01 ENCOUNTER — Emergency Department (HOSPITAL_COMMUNITY)
Admission: EM | Admit: 2021-07-01 | Discharge: 2021-07-02 | Disposition: A | Discharge status: Home / Self Care | Payer: No Typology Code available for payment source | Attending: Emergency Medicine | Admitting: Emergency Medicine

## 2021-07-01 DIAGNOSIS — S20212A Contusion of left front wall of thorax, initial encounter: Secondary | ICD-10-CM | POA: Diagnosis not present

## 2021-07-01 DIAGNOSIS — R0781 Pleurodynia: Secondary | ICD-10-CM | POA: Insufficient documentation

## 2021-07-01 DIAGNOSIS — R1084 Generalized abdominal pain: Secondary | ICD-10-CM | POA: Diagnosis not present

## 2021-07-01 DIAGNOSIS — S80211A Abrasion, right knee, initial encounter: Secondary | ICD-10-CM | POA: Insufficient documentation

## 2021-07-01 DIAGNOSIS — W228XXA Striking against or struck by other objects, initial encounter: Secondary | ICD-10-CM | POA: Insufficient documentation

## 2021-07-01 DIAGNOSIS — Y9389 Activity, other specified: Secondary | ICD-10-CM | POA: Insufficient documentation

## 2021-07-01 DIAGNOSIS — Z7982 Long term (current) use of aspirin: Secondary | ICD-10-CM | POA: Diagnosis not present

## 2021-07-01 DIAGNOSIS — S59902A Unspecified injury of left elbow, initial encounter: Secondary | ICD-10-CM | POA: Diagnosis present

## 2021-07-01 DIAGNOSIS — S50312A Abrasion of left elbow, initial encounter: Secondary | ICD-10-CM | POA: Diagnosis not present

## 2021-07-01 DIAGNOSIS — Y990 Civilian activity done for income or pay: Secondary | ICD-10-CM | POA: Insufficient documentation

## 2021-07-01 DIAGNOSIS — S301XXA Contusion of abdominal wall, initial encounter: Secondary | ICD-10-CM | POA: Diagnosis not present

## 2021-07-01 DIAGNOSIS — R109 Unspecified abdominal pain: Secondary | ICD-10-CM | POA: Diagnosis not present

## 2021-07-01 DIAGNOSIS — R0789 Other chest pain: Secondary | ICD-10-CM | POA: Diagnosis not present

## 2021-07-01 HISTORY — DX: Type 2 diabetes mellitus without complications: E11.9

## 2021-07-01 LAB — BASIC METABOLIC PANEL
Anion gap: 7 (ref 5–15)
BUN: 15 mg/dL (ref 6–20)
CO2: 23 mmol/L (ref 22–32)
Calcium: 8.6 mg/dL — ABNORMAL LOW (ref 8.9–10.3)
Chloride: 110 mmol/L (ref 98–111)
Creatinine, Ser: 0.96 mg/dL (ref 0.61–1.24)
GFR, Estimated: >60 mL/min (ref >60)
Glucose, Bld: 87 mg/dL (ref 70–99)
Potassium: 3.6 mmol/L (ref 3.5–5.1)
Sodium: 140 mmol/L (ref 135–145)

## 2021-07-01 IMAGING — CT CT ABD-PELV W/ CM
2 of 5 series · 15 of 46 positions shown, 17 images · IV contrast (Omnipaque or Isovue)
Comparison: None.

CLINICAL DATA: Abdominal pain, acute, nonlocalized

EXAM:
CT ABDOMEN AND PELVIS WITH CONTRAST
TECHNIQUE: Multidetector CT imaging of the abdomen and pelvis was performed
using the standard protocol following bolus administration of
intravenous contrast.

[Series 2: coronal st · coronal · 0.78mm/px · 3 of 106 slices shown]
[im 36/106  soft-tissue]
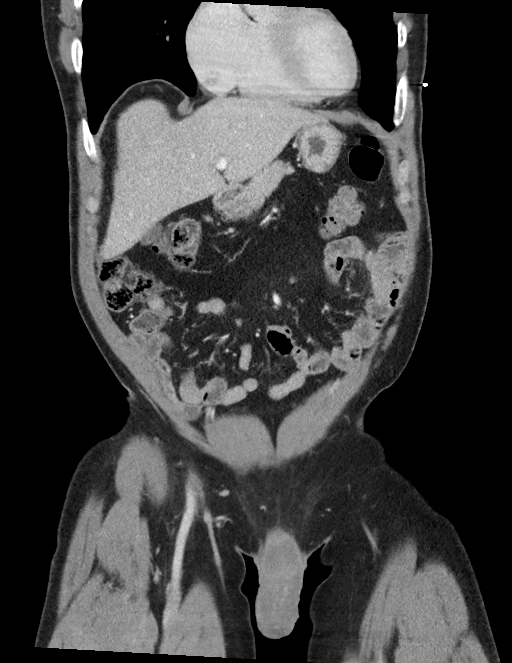
[im 47/106  soft-tissue]
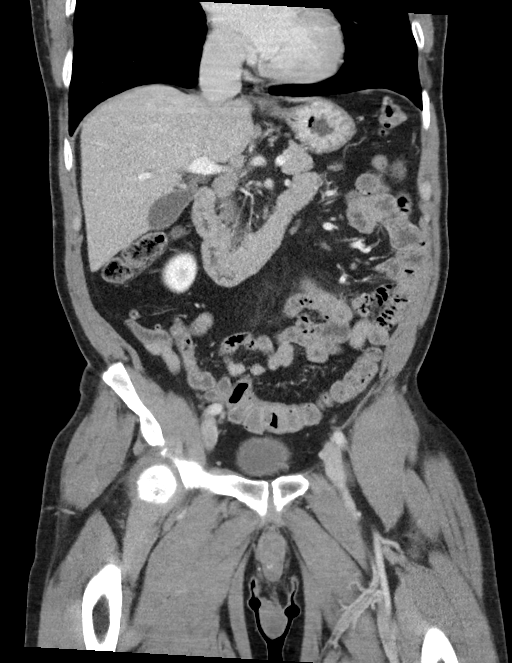
[im 59/106  soft-tissue]
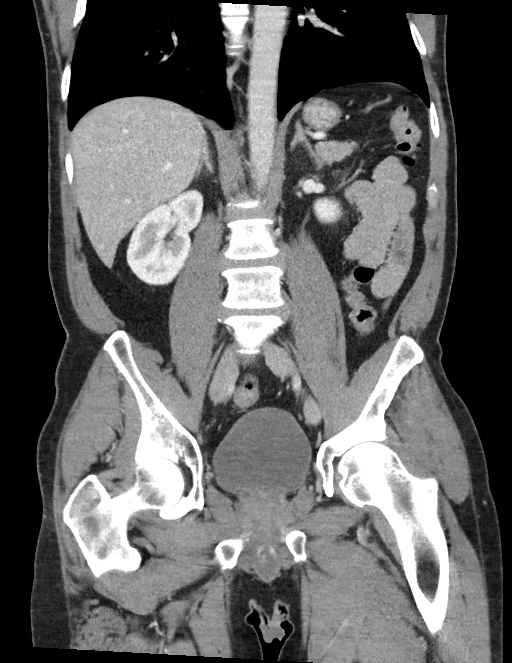

[Series 4: axial st · axial · 0.76mm/px · z∈[+915,+1365]mm · 12 of 102 slices shown, 14 images]
[im 6/102  soft-tissue]
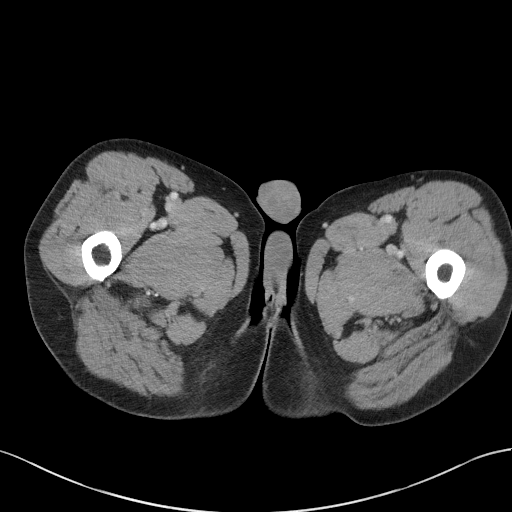
[im 6/102  bone]
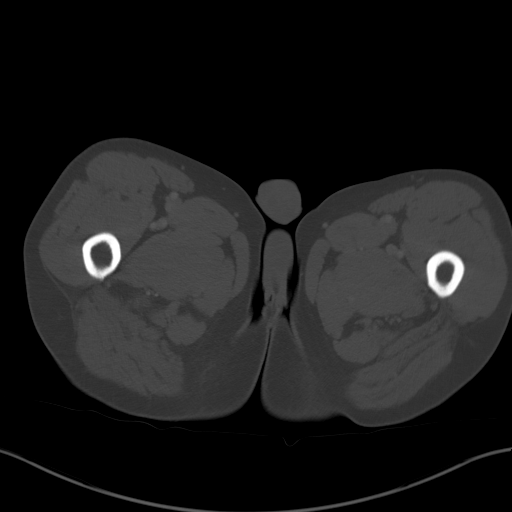
[im 17/102  soft-tissue]
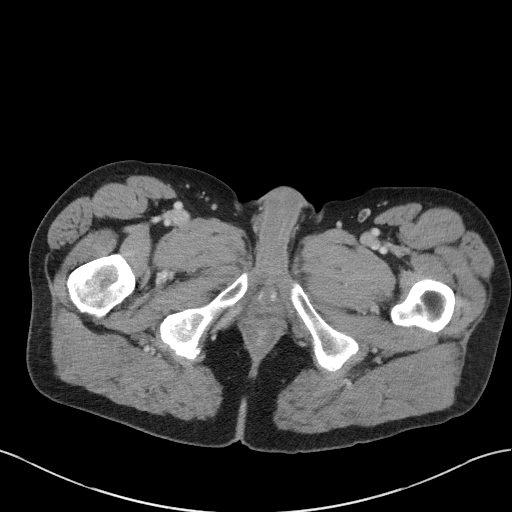
[im 23/102  soft-tissue]
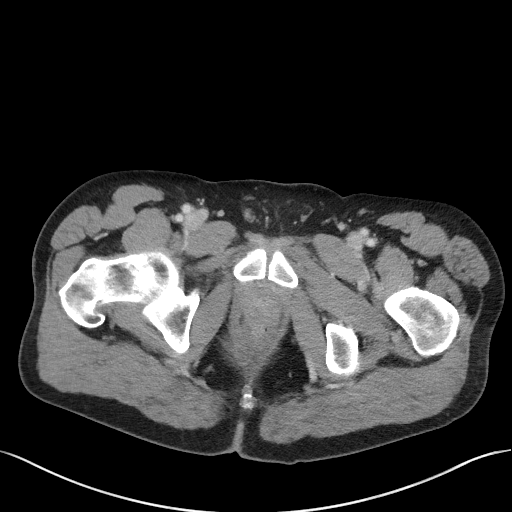
[im 29/102  soft-tissue]
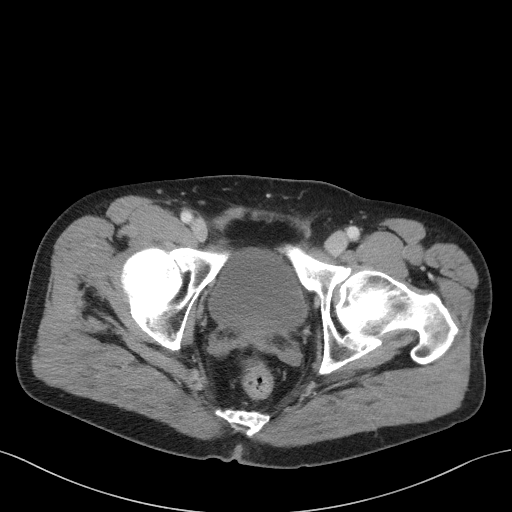
[im 40/102  soft-tissue]
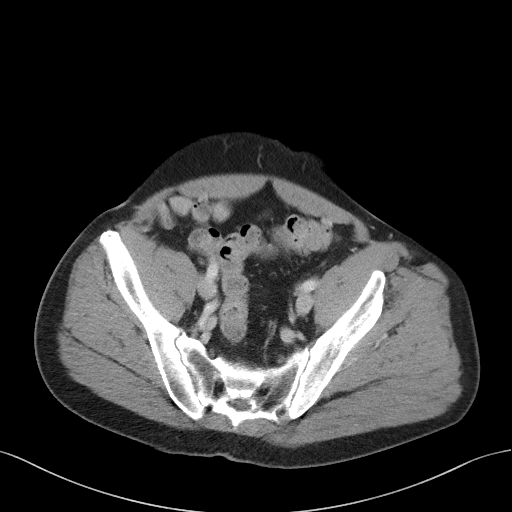
[im 45/102  soft-tissue]
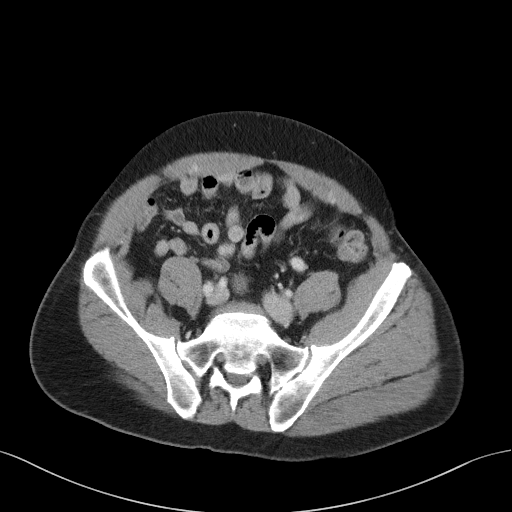
[im 57/102  soft-tissue]
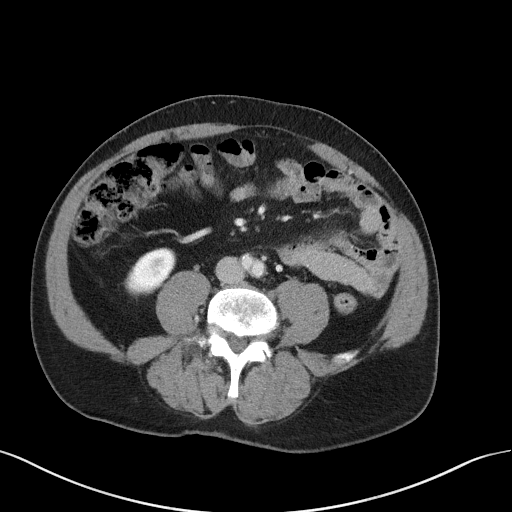
[im 62/102  soft-tissue]
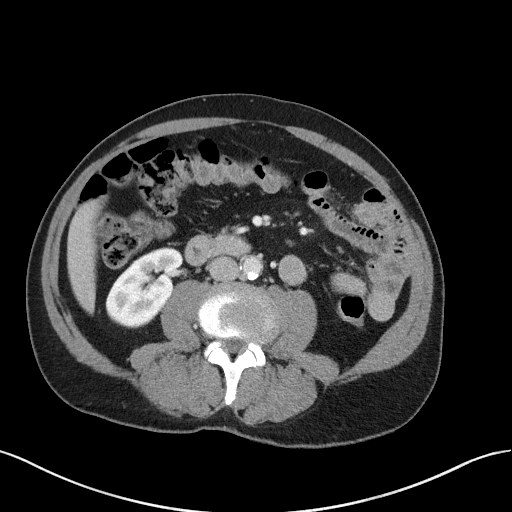
[im 73/102  soft-tissue]
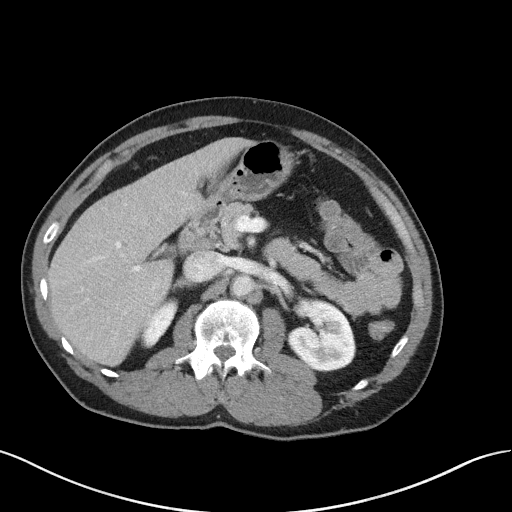
[im 73/102  bone]
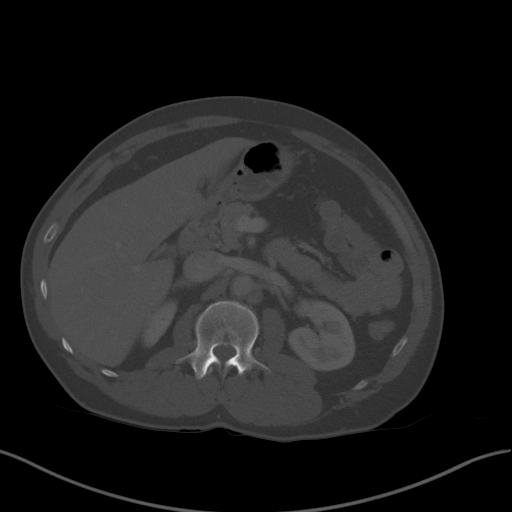
[im 79/102  soft-tissue]
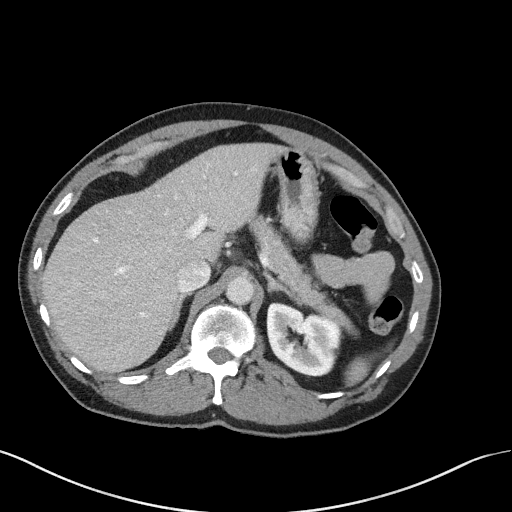
[im 85/102  soft-tissue]
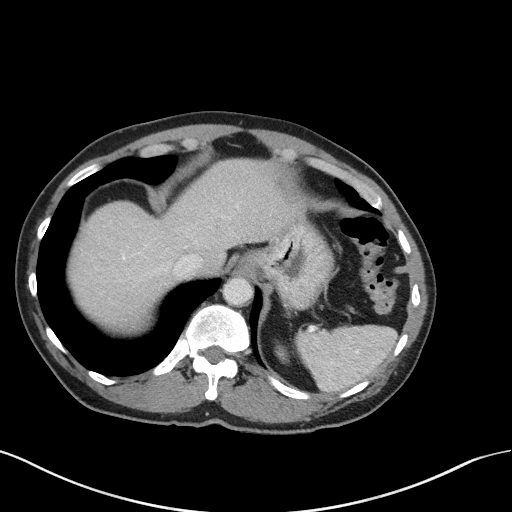
[im 96/102  soft-tissue]
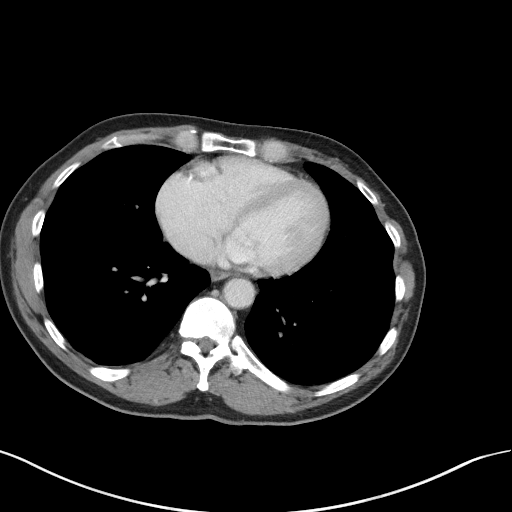

[15 of 46 positions shown; findings below may reference images not displayed]

RADIATION DOSE REDUCTION: This exam was performed according to the
departmental dose-optimization program which includes automated
exposure control, adjustment of the mA and/or kV according to
patient size and/or use of iterative reconstruction technique.

CONTRAST:  100mL OMNIPAQUE IOHEXOL 300 MG/ML  SOLN
FINDINGS: Lower chest: No acute abnormality.  Coronary artery calcification.

Hepatobiliary: No focal liver abnormality. No gallstones,
gallbladder wall thickening, or pericholecystic fluid. No biliary
dilatation.

Pancreas: No focal lesion. Normal pancreatic contour. No surrounding
inflammatory changes. No main pancreatic ductal dilatation.

Spleen: Normal in size without focal abnormality.

Adrenals/Urinary Tract:

No adrenal nodule bilaterally.

Bilateral kidneys enhance symmetrically.

No hydronephrosis. No hydroureter.

The urinary bladder is unremarkable.

On delayed imaging, there is no urothelial wall thickening and there
are no filling defects in the opacified portions of the bilateral
collecting systems or ureters.

Stomach/Bowel: Stomach is within normal limits. No evidence of bowel
wall thickening or dilatation. Appendix appears normal.

Vascular/Lymphatic: No abdominal aorta or iliac aneurysm. Severe
atherosclerotic plaque of the aorta and its branches. No abdominal,
pelvic, or inguinal lymphadenopathy.

Reproductive: Prostate is unremarkable.

Other: Trace fat stranding along the left mid abdomen mesentery (2:
35-46; [DATE]). No intraperitoneal free fluid. No intraperitoneal
free gas. No organized fluid collection.

Musculoskeletal:

No abdominal wall hernia or abnormality.

No suspicious lytic or blastic osseous lesions. No acute displaced
fracture. Left hip degenerative changes.
IMPRESSION: 1. Trace fat stranding along the left mid abdomen mesentery. This is
nonspecific; however, mesenteric hematoma cannot be excluded. Given
trauma history, recommend serial follow-up abdominal exams.
2.  Aortic Atherosclerosis ([RK]-[RK]).
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
## 2021-07-01 IMAGING — DX DG ELBOW COMPLETE 3+V*L*
4 series · 4 of 4 positions shown · non-contrast
Comparison: None.

CLINICAL DATA: Elbow pain

EXAM:
LEFT ELBOW - COMPLETE 3+ VIEW

[elbow ap]
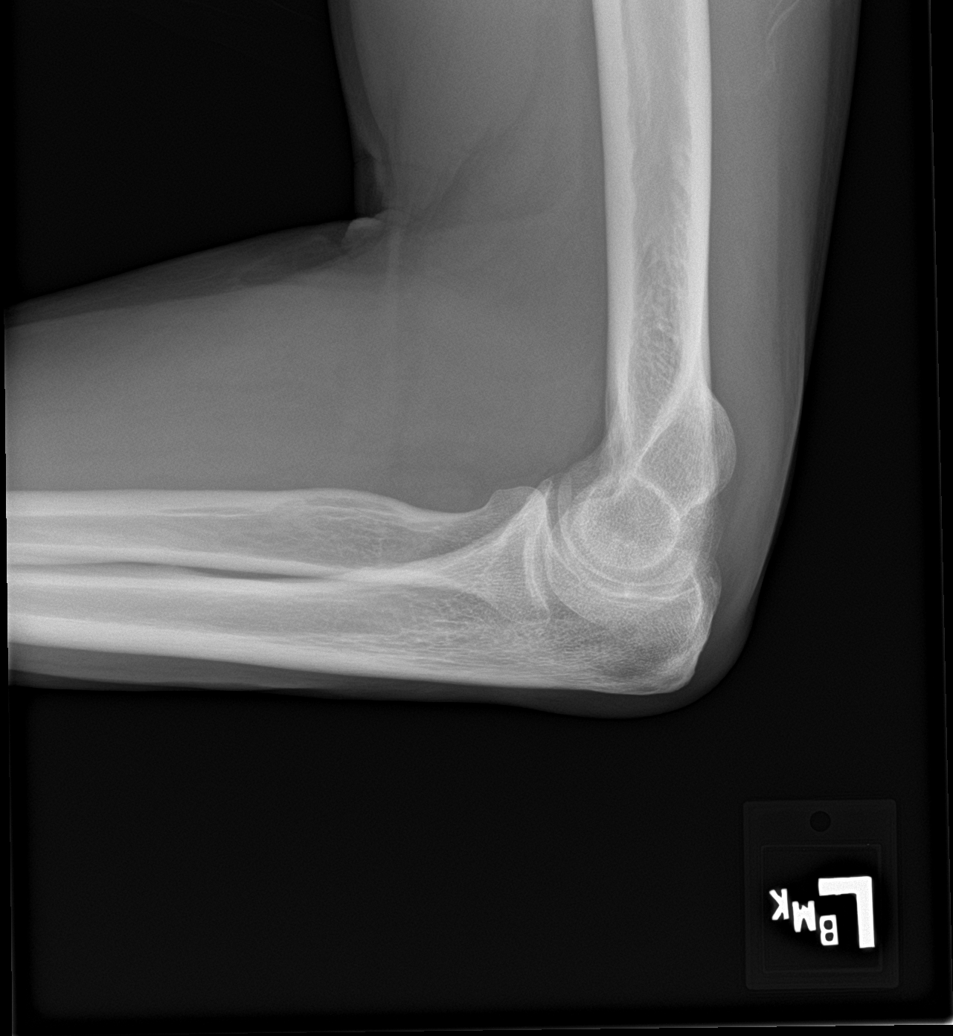

[elbow obl (1 of 2)]
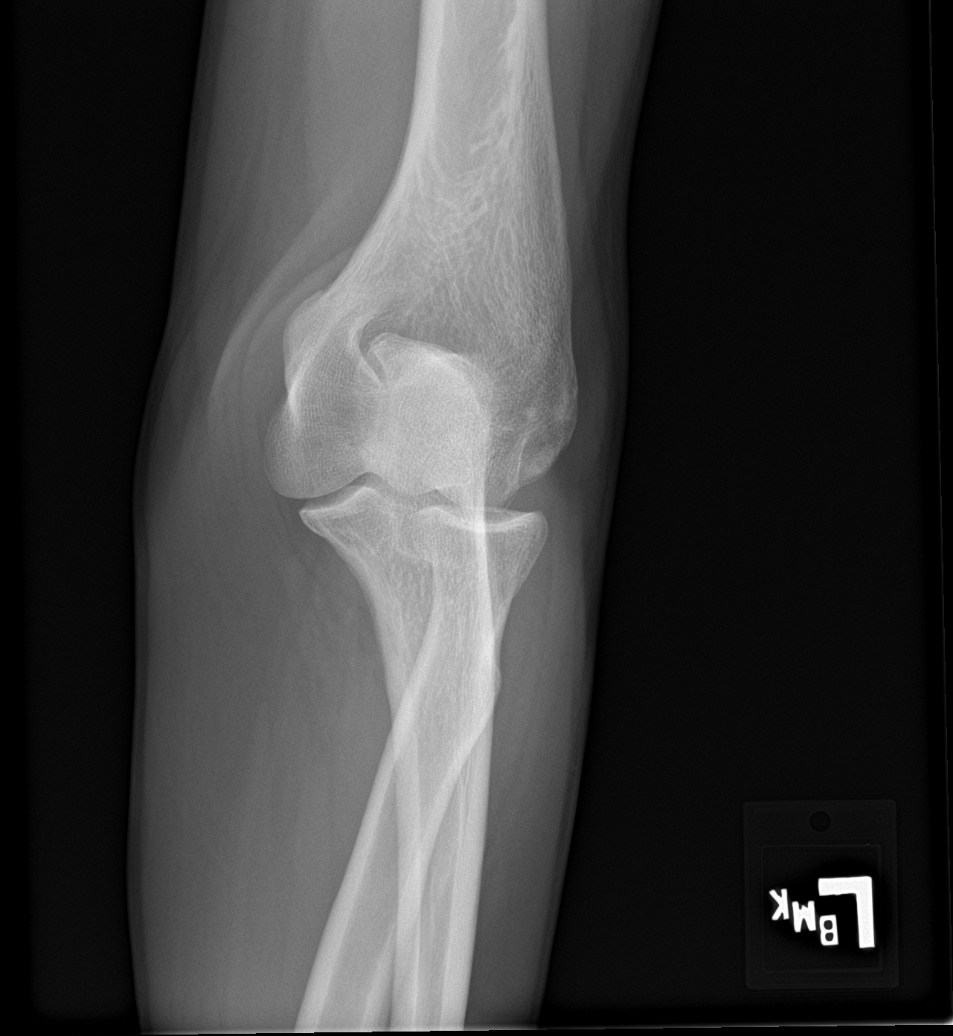

[elbow obl (2 of 2)]
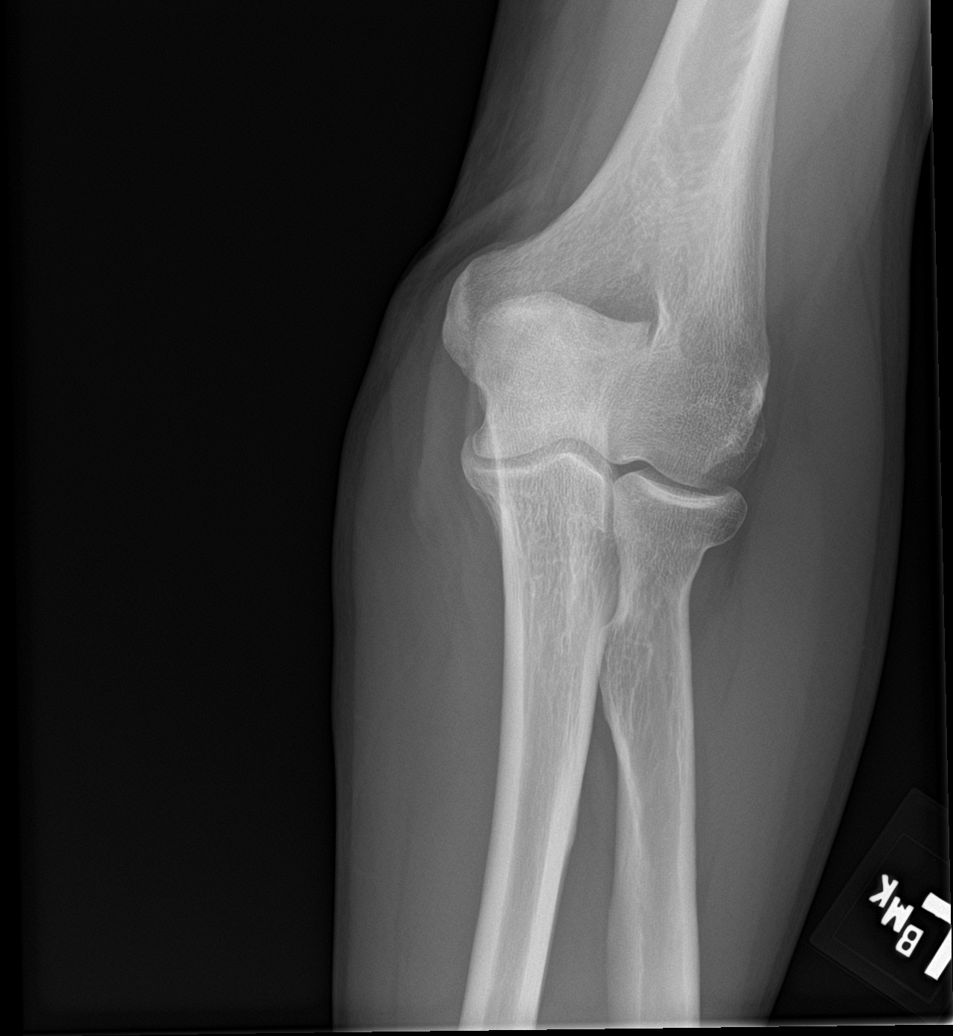

[elbow lat]
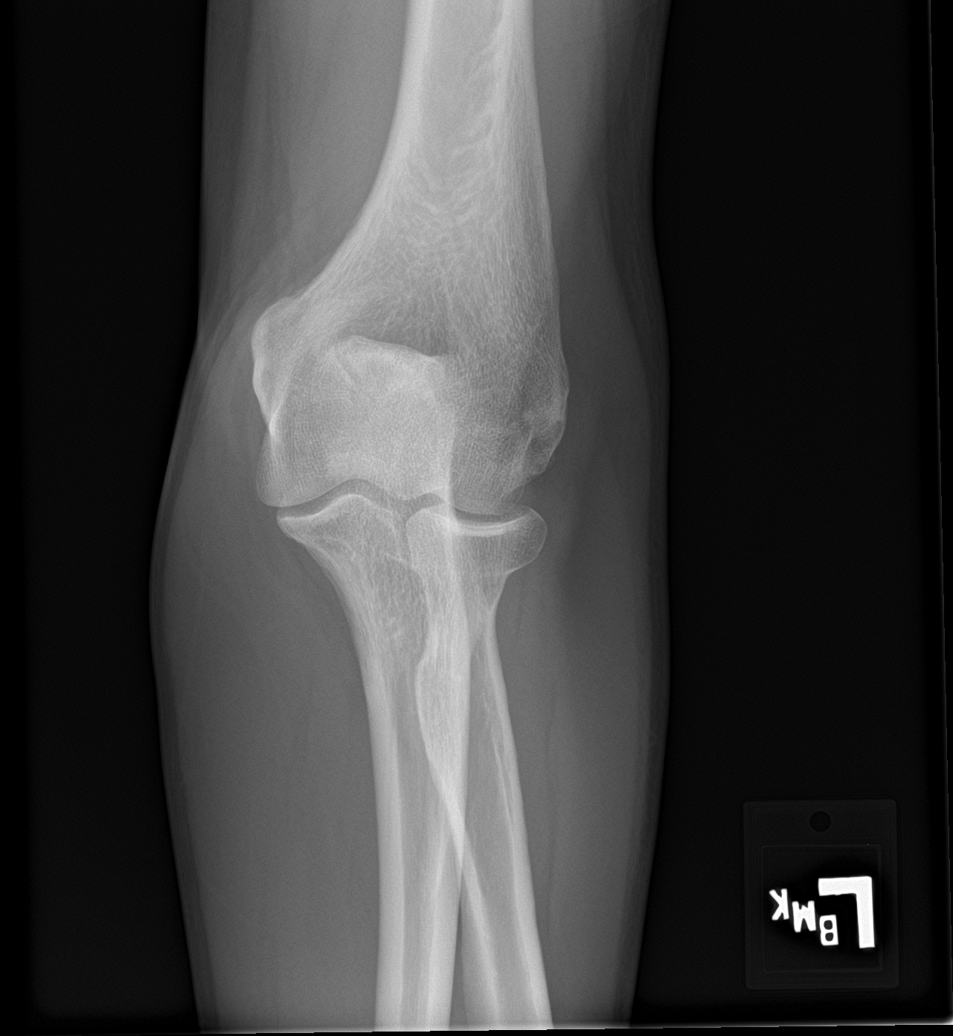

[4 of 4 positions shown; findings below may reference images not displayed]

FINDINGS: There is no evidence of fracture, dislocation, or joint effusion.
There is no evidence of arthropathy or other focal bone abnormality.
Soft tissues are unremarkable.
IMPRESSION: Negative.

## 2021-07-01 MED ORDER — IOHEXOL 300 MG/ML  SOLN
100.0000 mL | Freq: Once | INTRAMUSCULAR | Status: AC | PRN
  Administered 2021-07-01: 100 mL via INTRAVENOUS
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
## 2021-07-02 MED ORDER — OXYCODONE HCL 5 MG PO TABS: 5.0000 mg | ORAL_TABLET | ORAL | 0 refills | Status: DC | PRN

## 2021-07-02 MED ORDER — OXYCODONE-ACETAMINOPHEN 5-325 MG PO TABS: 1.0000 | ORAL_TABLET | Freq: Four times a day (QID) | ORAL | 0 refills | Status: DC | PRN

## 2021-07-02 NOTE — Discharge Instructions (Signed)
Your x-ray was concerning for a possible hematoma in the mesentery.  If you notice you are not developing abdominal pain, please return to the emergency department for reevaluation. ? ?Apply ice to areas that are painful.  Ice to be applied for 30 minutes at a time, 4 times a day. ? ?You may take ibuprofen and/or acetaminophen as needed for pain.  Please note that if you combine ibuprofen and acetaminophen, you will get better pain relief than you would get from taking either medication by itself.  Reserve oxycodone for pain not relieved with ibuprofen and acetaminophen. ?

## 2021-07-04 ENCOUNTER — Other Ambulatory Visit (HOSPITAL_COMMUNITY): Payer: Self-pay | Admitting: Interventional Radiology

## 2021-07-04 DIAGNOSIS — H93A9 Pulsatile tinnitus, unspecified ear: Secondary | ICD-10-CM

## 2021-07-07 MED FILL — Oxycodone w/ Acetaminophen Tab 5-325 MG: ORAL | Qty: 6 | Status: AC

## 2021-07-13 ENCOUNTER — Ambulatory Visit (HOSPITAL_COMMUNITY): Admission: RE | Admit: 2021-07-13 | Payer: BC Managed Care – PPO | Source: Ambulatory Visit

## 2021-07-14 ENCOUNTER — Ambulatory Visit (HOSPITAL_COMMUNITY)
Admission: RE | Admit: 2021-07-14 | Discharge: 2021-07-14 | Disposition: A | Discharge status: Home / Self Care | Payer: BC Managed Care – PPO | Source: Ambulatory Visit | Attending: Interventional Radiology | Admitting: Interventional Radiology

## 2021-07-14 DIAGNOSIS — H93A9 Pulsatile tinnitus, unspecified ear: Secondary | ICD-10-CM

## 2021-07-14 DIAGNOSIS — I6521 Occlusion and stenosis of right carotid artery: Secondary | ICD-10-CM | POA: Diagnosis not present

## 2021-07-14 DIAGNOSIS — H93A1 Pulsatile tinnitus, right ear: Secondary | ICD-10-CM | POA: Diagnosis not present

## 2021-07-14 DIAGNOSIS — Z9889 Other specified postprocedural states: Secondary | ICD-10-CM | POA: Diagnosis not present

## 2021-07-15 HISTORY — PX: IR RADIOLOGIST EVAL & MGMT: IMG5224

## 2021-08-19 DIAGNOSIS — K648 Other hemorrhoids: Secondary | ICD-10-CM | POA: Diagnosis not present

## 2021-08-19 DIAGNOSIS — Z86010 Personal history of colonic polyps: Secondary | ICD-10-CM | POA: Diagnosis not present

## 2021-08-19 DIAGNOSIS — D125 Benign neoplasm of sigmoid colon: Secondary | ICD-10-CM | POA: Diagnosis not present

## 2021-08-19 DIAGNOSIS — K5730 Diverticulosis of large intestine without perforation or abscess without bleeding: Secondary | ICD-10-CM | POA: Diagnosis not present

## 2021-08-19 DIAGNOSIS — D123 Benign neoplasm of transverse colon: Secondary | ICD-10-CM | POA: Diagnosis not present

## 2021-08-19 DIAGNOSIS — Z09 Encounter for follow-up examination after completed treatment for conditions other than malignant neoplasm: Secondary | ICD-10-CM | POA: Diagnosis not present

## 2021-09-16 ENCOUNTER — Telehealth (HOSPITAL_COMMUNITY): Payer: Self-pay | Admitting: Radiology

## 2021-09-16 ENCOUNTER — Other Ambulatory Visit (HOSPITAL_COMMUNITY): Payer: Self-pay | Admitting: Interventional Radiology

## 2021-09-16 ENCOUNTER — Telehealth: Payer: Self-pay | Admitting: Student

## 2021-09-16 DIAGNOSIS — H93A1 Pulsatile tinnitus, right ear: Secondary | ICD-10-CM

## 2021-09-20 ENCOUNTER — Ambulatory Visit: Payer: BC Managed Care – PPO | Admitting: "Endocrinology

## 2021-09-21 ENCOUNTER — Inpatient Hospital Stay (HOSPITAL_COMMUNITY): Admission: RE | Admit: 2021-09-21 | Payer: BC Managed Care – PPO | Source: Ambulatory Visit

## 2021-09-26 ENCOUNTER — Ambulatory Visit (HOSPITAL_COMMUNITY)
Admission: RE | Admit: 2021-09-26 | Discharge: 2021-09-26 | Disposition: A | Discharge status: Home / Self Care | Payer: BC Managed Care – PPO | Source: Ambulatory Visit | Attending: Interventional Radiology | Admitting: Interventional Radiology

## 2021-09-26 DIAGNOSIS — H93A1 Pulsatile tinnitus, right ear: Secondary | ICD-10-CM

## 2021-09-26 DIAGNOSIS — I6521 Occlusion and stenosis of right carotid artery: Secondary | ICD-10-CM | POA: Diagnosis not present

## 2021-09-28 HISTORY — PX: IR RADIOLOGIST EVAL & MGMT: IMG5224

## 2021-10-20 ENCOUNTER — Telehealth (HOSPITAL_COMMUNITY): Payer: Self-pay

## 2021-10-20 NOTE — Telephone Encounter (Signed)
Returned pt's call, no answer, left vm. AW  

## 2021-10-24 ENCOUNTER — Ambulatory Visit: Payer: BC Managed Care – PPO | Admitting: Family Medicine

## 2021-10-24 ENCOUNTER — Encounter: Payer: Self-pay | Admitting: Family Medicine

## 2021-10-24 VITALS — BP 105/74 | HR 74 | Temp 98.2°F | Wt 163.0 lb

## 2021-10-24 DIAGNOSIS — E1169 Type 2 diabetes mellitus with other specified complication: Secondary | ICD-10-CM

## 2021-10-24 DIAGNOSIS — H93A1 Pulsatile tinnitus, right ear: Secondary | ICD-10-CM

## 2021-10-24 DIAGNOSIS — R0981 Nasal congestion: Secondary | ICD-10-CM

## 2021-10-24 DIAGNOSIS — E119 Type 2 diabetes mellitus without complications: Secondary | ICD-10-CM | POA: Diagnosis not present

## 2021-10-24 DIAGNOSIS — E785 Hyperlipidemia, unspecified: Secondary | ICD-10-CM

## 2021-10-24 NOTE — Progress Notes (Signed)
   Subjective:    Patient ID: Daniel Burton, male    DOB: Feb 16, 1961, 61 y.o.   MRN: [id number]  HPI Pt arrives for follow up. Pt states since his surgery in July 2022; he has been having headaches. Pt is taking one Metformin daily.   Nice patient He had a stent placed by interventional radiology due to a infundibulum within the artery.  He is supposed to have a follow-up with CTA in the near future.  He states he does not want to go through a catheterization.  Patient also has diabetes was seen by specialist for this Review of Systems     Objective:   Physical Exam General-in no acute distress Eyes-no discharge Lungs-respiratory rate normal, CTA CV-no murmurs,RRR Extremities skin warm dry no edema Neuro grossly normal Behavior normal, alert        Assessment & Plan:  1. Type 2 diabetes mellitus without complication, without long-term current use of insulin [facility]) Being followed by endocrinology.  Patient requests to have blood work done for this fall.  He is uncertain if he will follow-up with endocrinology or follow-up with [place] and follow-up with a different endocrinologist.  He will let us know. - Hemoglobin K8M - Basic metabolic panel - Lipid panel  2. Hyperlipidemia associated with type 2 diabetes mellitus ([place]) Healthy diet regular activity continue Crestor lab work ordered to be done early this fall - Hemoglobin K3K - Basic metabolic panel - Lipid panel  3. Pulsatile tinnitus of right ear Patient will need to have a follow-up CT angio.  We will touch base with his interventional radiologist.  Patient states he has not heard when this test was going to be.  According to the notes in the chart it was supposed to be in early August.  4. Nasal congestion Patient will be seeing ENT.  Part of the visit with ENT though is partly because he has ongoing troubles with her right ear pulsatile sensation.  In regards to this may need further input from ENT scheduled to see  Dr.Teoh soon.  It is important for the ENT to comment whether or not they feel that his ongoing trouble is related to a ENT issue or vascular. - Hemoglobin J1P - Basic metabolic panel - Lipid panel  The interventional radiologist also recommended for him to see his neurosurgeon Dr. Sherley Bounds for evaluation of this sensation he is having on the right side to see if that might be associated with his cervical spine problem.  He has a history of cervical spine disease with previous surgery.

## 2021-10-26 ENCOUNTER — Telehealth (HOSPITAL_COMMUNITY): Payer: Self-pay

## 2021-10-26 NOTE — Telephone Encounter (Signed)
Returned pt's call, no answer, no vm. AW 

## 2021-10-27 ENCOUNTER — Telehealth: Payer: Self-pay | Admitting: Family Medicine

## 2021-10-27 NOTE — Telephone Encounter (Signed)
Patient under the care of interventional radiologist.  They were supposed to do a CT angio of the neck.  Patient does not want to have the catheter process for this.  I sent a note the week of 10/25/2021.  If we do not hear anything regarding this over the course of the next 7 days will send another note to interventional radiology

## 2021-11-03 DIAGNOSIS — H01002 Unspecified blepharitis right lower eyelid: Secondary | ICD-10-CM | POA: Diagnosis not present

## 2021-11-03 DIAGNOSIS — H25093 Other age-related incipient cataract, bilateral: Secondary | ICD-10-CM | POA: Diagnosis not present

## 2021-11-03 DIAGNOSIS — H01001 Unspecified blepharitis right upper eyelid: Secondary | ICD-10-CM | POA: Diagnosis not present

## 2021-11-03 DIAGNOSIS — H40013 Open angle with borderline findings, low risk, bilateral: Secondary | ICD-10-CM | POA: Diagnosis not present

## 2021-11-08 ENCOUNTER — Other Ambulatory Visit (HOSPITAL_COMMUNITY): Payer: Self-pay | Admitting: Interventional Radiology

## 2021-11-08 DIAGNOSIS — H93A1 Pulsatile tinnitus, right ear: Secondary | ICD-10-CM

## 2021-11-14 ENCOUNTER — Telehealth (HOSPITAL_COMMUNITY): Payer: Self-pay

## 2021-11-14 NOTE — Telephone Encounter (Signed)
Called to schedule cta head/neck, no answer, left vm. AW 

## 2021-11-17 DIAGNOSIS — H9313 Tinnitus, bilateral: Secondary | ICD-10-CM | POA: Diagnosis not present

## 2021-11-17 DIAGNOSIS — J342 Deviated nasal septum: Secondary | ICD-10-CM | POA: Diagnosis not present

## 2021-11-17 DIAGNOSIS — H903 Sensorineural hearing loss, bilateral: Secondary | ICD-10-CM | POA: Diagnosis not present

## 2021-11-17 DIAGNOSIS — J343 Hypertrophy of nasal turbinates: Secondary | ICD-10-CM | POA: Diagnosis not present

## 2021-11-17 DIAGNOSIS — J310 Chronic rhinitis: Secondary | ICD-10-CM | POA: Diagnosis not present

## 2021-12-13 ENCOUNTER — Ambulatory Visit (HOSPITAL_COMMUNITY): Payer: BC Managed Care – PPO

## 2021-12-23 ENCOUNTER — Other Ambulatory Visit (HOSPITAL_COMMUNITY): Payer: Self-pay | Admitting: Interventional Radiology

## 2021-12-23 ENCOUNTER — Ambulatory Visit (HOSPITAL_COMMUNITY)
Admission: RE | Admit: 2021-12-23 | Discharge: 2021-12-23 | Disposition: A | Discharge status: Home / Self Care | Payer: BC Managed Care – PPO | Source: Ambulatory Visit | Attending: Interventional Radiology | Admitting: Interventional Radiology

## 2021-12-23 DIAGNOSIS — H93A1 Pulsatile tinnitus, right ear: Secondary | ICD-10-CM

## 2021-12-23 LAB — POCT I-STAT CREATININE: Creatinine, Ser: 1.1 mg/dL (ref 0.61–1.24)

## 2021-12-23 MED ORDER — IOHEXOL 350 MG/ML SOLN
100.0000 mL | Freq: Once | INTRAVENOUS | Status: AC | PRN
  Administered 2021-12-23: 100 mL via INTRAVENOUS

## 2021-12-27 ENCOUNTER — Telehealth (HOSPITAL_COMMUNITY): Payer: Self-pay

## 2021-12-27 NOTE — Telephone Encounter (Signed)
Called pt regarding recent imaging, no answer, left vm. AW  

## 2021-12-30 ENCOUNTER — Ambulatory Visit (HOSPITAL_COMMUNITY): Payer: BC Managed Care – PPO

## 2022-01-11 ENCOUNTER — Ambulatory Visit (HOSPITAL_COMMUNITY)
Admission: RE | Admit: 2022-01-11 | Discharge: 2022-01-11 | Disposition: A | Discharge status: Home / Self Care | Payer: BC Managed Care – PPO | Source: Ambulatory Visit | Attending: Interventional Radiology | Admitting: Interventional Radiology

## 2022-01-11 DIAGNOSIS — H903 Sensorineural hearing loss, bilateral: Secondary | ICD-10-CM | POA: Diagnosis not present

## 2022-01-11 DIAGNOSIS — H9313 Tinnitus, bilateral: Secondary | ICD-10-CM | POA: Diagnosis not present

## 2022-01-11 DIAGNOSIS — J310 Chronic rhinitis: Secondary | ICD-10-CM | POA: Diagnosis not present

## 2022-01-11 DIAGNOSIS — H93A1 Pulsatile tinnitus, right ear: Secondary | ICD-10-CM

## 2022-01-11 DIAGNOSIS — J343 Hypertrophy of nasal turbinates: Secondary | ICD-10-CM | POA: Diagnosis not present

## 2022-01-11 DIAGNOSIS — J342 Deviated nasal septum: Secondary | ICD-10-CM | POA: Diagnosis not present

## 2022-01-17 ENCOUNTER — Ambulatory Visit (HOSPITAL_COMMUNITY)
Admission: RE | Admit: 2022-01-17 | Discharge: 2022-01-17 | Disposition: A | Discharge status: Home / Self Care | Payer: BC Managed Care – PPO | Source: Ambulatory Visit | Attending: Interventional Radiology | Admitting: Interventional Radiology

## 2022-01-17 DIAGNOSIS — H93A1 Pulsatile tinnitus, right ear: Secondary | ICD-10-CM | POA: Diagnosis not present

## 2022-01-18 HISTORY — PX: IR RADIOLOGIST EVAL & MGMT: IMG5224

## 2022-01-29 NOTE — Telephone Encounter (Signed)
I sent another note to interventional radiology specifically Dr. Estanislado Pandy

## 2022-02-08 ENCOUNTER — Telehealth: Payer: Self-pay

## 2022-02-08 ENCOUNTER — Telehealth: Payer: Self-pay | Admitting: Orthopedic Surgery

## 2022-02-08 DIAGNOSIS — Z79899 Other long term (current) drug therapy: Secondary | ICD-10-CM

## 2022-02-08 DIAGNOSIS — E119 Type 2 diabetes mellitus without complications: Secondary | ICD-10-CM

## 2022-02-08 DIAGNOSIS — E1169 Type 2 diabetes mellitus with other specified complication: Secondary | ICD-10-CM

## 2022-02-08 DIAGNOSIS — M549 Dorsalgia, unspecified: Secondary | ICD-10-CM

## 2022-02-08 DIAGNOSIS — Z125 Encounter for screening for malignant neoplasm of prostate: Secondary | ICD-10-CM

## 2022-02-08 DIAGNOSIS — Z0000 Encounter for general adult medical examination without abnormal findings: Secondary | ICD-10-CM

## 2022-02-08 NOTE — Telephone Encounter (Signed)
Lab orders placed. Pt is aware that we were going to place labs

## 2022-02-08 NOTE — Telephone Encounter (Signed)
Patient called wants an appt wants to know if Dr. Aline Brochure has any openings today.  Advised him we do not take walk ins.  I asked him what did he need to be seen for and he said it's his lower back and he thinks he has a pulled muscle.  I asked him if he has been to his PCP about it, he said no.  I advised him he will need to see his PCP and they can refer him to [place].

## 2022-02-08 NOTE — Telephone Encounter (Signed)
Angela Nevin spoke w/the patient earlier today and he has called back.  He did follow up with Dr. Lance Sell office today.  I do see the referral, but I don't think it was routed correctly to fall in our WQ.  The referral if for his back.  Please advise.

## 2022-02-08 NOTE — Telephone Encounter (Signed)
1.  Please go ahead with referral 2.  Lipid, liver, metabolic 7, A0U, urine ACR, PSA Wellness, hypertension, hyperlipidemia, diabetes, screening prostate cancer

## 2022-02-08 NOTE — Telephone Encounter (Signed)
Message sent to Dr Aline Brochure asking when/where to work in. [place] to advise patient we will call him when we know.

## 2022-02-08 NOTE — Addendum Note (Signed)
Addended by: Vicente Males on: 02/08/2022 02:48 PM   Modules accepted: Orders

## 2022-02-08 NOTE — Telephone Encounter (Signed)
Referral placed for back pain per patient.   Lab orders active from 10/24/21 Lipid, BMET and A1C. Please advise. Thank you

## 2022-02-08 NOTE — Telephone Encounter (Signed)
Caller name: KENTLEY BLYDEN  On DPR?: Yes  Call back number: [phone] (mobile)  Provider they see: Kathyrn Drown, MD  Reason for call:Pt wants a referral for Dr Anselm Jungling bone specialist he can see him tomorrow but needs the referral. Also pt has phy on Dec 13th needs blood work ordered

## 2022-02-09 ENCOUNTER — Ambulatory Visit (INDEPENDENT_AMBULATORY_CARE_PROVIDER_SITE_OTHER): Payer: BC Managed Care – PPO | Admitting: Orthopedic Surgery

## 2022-02-09 ENCOUNTER — Encounter: Payer: Self-pay | Admitting: Orthopedic Surgery

## 2022-02-09 ENCOUNTER — Telehealth: Payer: Self-pay

## 2022-02-09 ENCOUNTER — Ambulatory Visit (INDEPENDENT_AMBULATORY_CARE_PROVIDER_SITE_OTHER): Payer: BC Managed Care – PPO

## 2022-02-09 VITALS — BP 127/76 | HR 74 | Ht 72.0 in | Wt 165.0 lb

## 2022-02-09 DIAGNOSIS — M545 Low back pain, unspecified: Secondary | ICD-10-CM

## 2022-02-09 MED ORDER — METHOCARBAMOL 750 MG PO TABS: 750.0000 mg | ORAL_TABLET | Freq: Four times a day (QID) | ORAL | 2 refills | Status: DC

## 2022-02-09 MED ORDER — PREDNISONE 10 MG (48) PO TBPK: ORAL_TABLET | Freq: Every day | ORAL | 0 refills | Status: DC

## 2022-02-09 NOTE — Progress Notes (Signed)
NEW PROBLEM//OFFICE VISIT   Chief Complaint  Patient presents with   Back Pain    Since Friday patient has pain down right leg below knee    61 year old male presents for evaluation of acute right leg pain and back pain which started after he attended a football game last Thursday.  He denies any trauma.  He was not lifting anything heavy.  He had a bout of back pain back in 2014 and his x-ray back then showed some mild arthritis of his lumbar spine  He does have the radicular pain in the right leg which goes into the calf and this is associated with right-sided lower back pain.  He did take some ibuprofen and says things are starting to ease up a little bit.  He says that during the day the back seems to loosen up       ROS: He denies any bowel or bladder dysfunction  No Known Allergies  Current Outpatient Medications  Medication Instructions   aspirin EC 81 mg, Oral, Daily, Swallow whole.   ibuprofen (ADVIL) 200 mg, Oral, Every 6 hours PRN   metFORMIN (GLUCOPHAGE) 500 MG tablet One BID   methocarbamol (ROBAXIN) 750 mg, Oral, 4 times daily   predniSONE (STERAPRED UNI-PAK 48 TAB) 10 MG (48) TBPK tablet Oral, Daily   rosuvastatin (CRESTOR) 20 MG tablet One each evening    ROS   BP 127/76   Pulse 74   Ht 6' (1.829 m)   Wt 165 lb (74.8 kg)   BMI 22.38 kg/m   Body mass index is 22.38 kg/m.  General appearance: Well-developed well-nourished no gross deformities  Cardiovascular normal pulse and perfusion normal color without edema  Neurologically no sensation loss or deficits or pathologic reflexes  Psychological: Awake alert and oriented x3 mood and affect normal  Skin no lacerations or ulcerations no nodularity no palpable masses, no erythema or nodularity  Musculoskeletal: Spinal examination shows no malalignment  He is tender on the central portion of the lower back near the belt line and then off to the right straight leg raise test is negative he has normal  sensation in his leg has normal strength in his right foot and leg      Past Medical History:  Diagnosis Date   Back pain with history of spinal surgery cervical spine only    Diabetes mellitus without complication (HCC)    High cholesterol    Tinnitus of right ear 10/18/2020   jugular bulb   Tubular adenoma 02/21/2021   Tubular adenoma, sessile serrated adenoma, colonoscopy, [facility],Dr.Brahmbhatt, recommended to have repeat colonoscopy May 2023    Past Surgical History:  Procedure Laterality Date   CERVICAL SPINE SURGERY  2021   ELBOW SURGERY Right 1979   HERNIA REPAIR     IR ANGIO INTRA EXTRACRAN SEL COM CAROTID INNOMINATE BILAT MOD SED  06/25/2020   IR ANGIO INTRA EXTRACRAN SEL COM CAROTID INNOMINATE UNI R MOD SED  10/18/2020   IR ANGIO VERTEBRAL SEL SUBCLAVIAN INNOMINATE BILAT MOD SED  06/25/2020   IR CT HEAD LTD  10/18/2020   IR RADIOLOGIST EVAL & MGMT  07/13/2020   IR RADIOLOGIST EVAL & MGMT  09/10/2020   IR RADIOLOGIST EVAL & MGMT  11/03/2020   IR RADIOLOGIST EVAL & MGMT  12/03/2020   IR RADIOLOGIST EVAL & MGMT  02/11/2021   IR RADIOLOGIST EVAL & MGMT  07/15/2021   IR RADIOLOGIST EVAL & MGMT  09/28/2021   IR RADIOLOGIST EVAL & MGMT  01/18/2022   IR TRANSCATH PLC STENT  INITIAL VEIN  INC ANGIOPLASTY  10/18/2020   IR US GUIDE VASC ACCESS RIGHT  10/18/2020   IR VENO/JUGULAR RIGHT  10/18/2020   RADIOLOGY WITH ANESTHESIA N/A 10/18/2020   Procedure: Liz Malady;  Surgeon: Luanne Bras, MD;  Location: [facility];  Service: Radiology;  Laterality: N/A;    Family History  Problem Relation Age of Onset   Diabetes Mother    Heart attack Mother    Throat cancer Father    Liver cancer Sister    Thyroid disease Sister    Diabetes Brother    Allergic rhinitis Neg Hx    Angioedema Neg Hx    Asthma Neg Hx    Urticaria Neg Hx    Social History   Tobacco Use   Smoking status: Former    Types: Cigarettes   Smokeless tobacco: Former  Scientific laboratory technician Use: Never used  Substance Use  Topics   Alcohol use: No   Drug use: Not Currently    No Known Allergies  Current Meds  Medication Sig   aspirin EC 81 MG tablet Take 1 tablet (81 mg total) by mouth daily. Swallow whole.   ibuprofen (ADVIL) 200 MG tablet Take 200 mg by mouth every 6 (six) hours as needed for headache or moderate pain.   methocarbamol (ROBAXIN) 750 MG tablet Take 1 tablet (750 mg total) by mouth 4 (four) times daily.   predniSONE (STERAPRED UNI-PAK 48 TAB) 10 MG (48) TBPK tablet Take by mouth daily.   rosuvastatin (CRESTOR) 20 MG tablet One each evening     MEDICAL DECISION MAKING  A.  Encounter Diagnosis  Name Primary?   Lumbar pain Yes    B. DATA ANALYSED:   IMAGING: Interpretation of images: I have personally reviewed the images and my interpretation is normal alignment with minimal degenerative changes Orders: No new orders  Outside records reviewed: None available for recorded   C. MANAGEMENT   Recommend nonoperative management.  A brief course of steroids and muscle relaxer rest and heating pad warm soaks baths etc. should be fine  No follow-up needed unless he does not improve  Meds ordered this encounter  Medications   predniSONE (STERAPRED UNI-PAK 48 TAB) 10 MG (48) TBPK tablet    Sig: Take by mouth daily.    Dispense:  48 tablet    Refill:  0   methocarbamol (ROBAXIN) 750 MG tablet    Sig: Take 1 tablet (750 mg total) by mouth 4 (four) times daily.    Dispense:  60 tablet    Refill:  2     Arther Abbott, MD  02/09/2022 9:51 AM

## 2022-02-09 NOTE — Progress Notes (Deleted)
NEW PROBLEM//OFFICE VISIT   Chief Complaint  Patient presents with  . Back Pain    Since Friday patient has pain down right leg below knee    HPI   ROS: ***  No Known Allergies  Current Outpatient Medications  Medication Instructions  . aspirin EC 81 mg, Oral, Daily, Swallow whole.  . ibuprofen (ADVIL) 200 mg, Oral, Every 6 hours PRN  . metFORMIN (GLUCOPHAGE) 500 MG tablet One BID  . rosuvastatin (CRESTOR) 20 MG tablet One each evening    ROS   BP 127/76   Pulse 74   Ht 6' (1.829 m)   Wt 165 lb (74.8 kg)   BMI 22.38 kg/m   Body mass index is 22.38 kg/m.  General appearance: Well-developed well-nourished no gross deformities  Cardiovascular normal pulse and perfusion normal color without edema  Neurologically no sensation loss or deficits or pathologic reflexes  Psychological: Awake alert and oriented x3 mood and affect normal  Skin no lacerations or ulcerations no nodularity no palpable masses, no erythema or nodularity  Musculoskeletal: ***      Past Medical History:  Diagnosis Date  . Back pain with history of spinal surgery   . Diabetes mellitus without complication ([place])   . High cholesterol   . Tinnitus of right ear 10/18/2020   jugular bulb  . Tubular adenoma 02/21/2021   Tubular adenoma, sessile serrated adenoma, colonoscopy, [facility],Dr.Brahmbhatt, recommended to have repeat colonoscopy May 2023    Past Surgical History:  Procedure Laterality Date  . CERVICAL SPINE SURGERY  2021  . ELBOW SURGERY Right 1979  . HERNIA REPAIR    . IR ANGIO INTRA EXTRACRAN SEL COM CAROTID INNOMINATE BILAT MOD SED  06/25/2020  . IR ANGIO INTRA EXTRACRAN SEL COM CAROTID INNOMINATE UNI R MOD SED  10/18/2020  . IR ANGIO VERTEBRAL SEL SUBCLAVIAN INNOMINATE BILAT MOD SED  06/25/2020  . IR CT HEAD LTD  10/18/2020  . IR RADIOLOGIST EVAL & MGMT  07/13/2020  . IR RADIOLOGIST EVAL & MGMT  09/10/2020  . IR RADIOLOGIST EVAL & MGMT  11/03/2020  . IR RADIOLOGIST EVAL & MGMT   12/03/2020  . IR RADIOLOGIST EVAL & MGMT  02/11/2021  . IR RADIOLOGIST EVAL & MGMT  07/15/2021  . IR RADIOLOGIST EVAL & MGMT  09/28/2021  . IR RADIOLOGIST EVAL & MGMT  01/18/2022  . IR TRANSCATH PLC STENT  INITIAL VEIN  INC ANGIOPLASTY  10/18/2020  . IR US GUIDE VASC ACCESS RIGHT  10/18/2020  . IR VENO/JUGULAR RIGHT  10/18/2020  . RADIOLOGY WITH ANESTHESIA N/A 10/18/2020   Procedure: Liz Malady;  Surgeon: Luanne Bras, MD;  Location: [facility];  Service: Radiology;  Laterality: N/A;    Family History  Problem Relation Age of Onset  . Diabetes Mother   . Heart attack Mother   . Throat cancer Father   . Liver cancer Sister   . Thyroid disease Sister   . Diabetes Brother   . Allergic rhinitis Neg Hx   . Angioedema Neg Hx   . Asthma Neg Hx   . Urticaria Neg Hx    Social History   Tobacco Use  . Smoking status: Former    Types: Cigarettes  . Smokeless tobacco: Former  Media planner  . Vaping Use: Never used  Substance Use Topics  . Alcohol use: No  . Drug use: Not Currently    No Known Allergies  Current Meds  Medication Sig  . aspirin EC 81 MG tablet Take 1 tablet (81  mg total) by mouth daily. Swallow whole.  . ibuprofen (ADVIL) 200 MG tablet Take 200 mg by mouth every 6 (six) hours as needed for headache or moderate pain.  . rosuvastatin (CRESTOR) 20 MG tablet One each evening     MEDICAL DECISION MAKING  A.  Encounter Diagnosis  Name Primary?  . Lumbar pain Yes    B. DATA ANALYSED:   IMAGING: Interpretation of images: I have personally reviewed the images and my interpretation is *** Orders: ***  Outside records reviewed: ***   C. MANAGEMENT   ***  No orders of the defined types were placed in this encounter.   Arther Abbott, MD  02/09/2022 9:38 AM

## 2022-02-09 NOTE — Telephone Encounter (Signed)
I called and left message for patient to come in for his appointment at 9:30 am this morning with Dr. Aline Brochure.

## 2022-02-23 ENCOUNTER — Ambulatory Visit (INDEPENDENT_AMBULATORY_CARE_PROVIDER_SITE_OTHER): Payer: BC Managed Care – PPO | Admitting: Family Medicine

## 2022-02-23 ENCOUNTER — Ambulatory Visit (HOSPITAL_COMMUNITY)
Admission: RE | Admit: 2022-02-23 | Discharge: 2022-02-23 | Disposition: A | Discharge status: Home / Self Care | Payer: BC Managed Care – PPO | Source: Ambulatory Visit | Attending: Family Medicine | Admitting: Family Medicine

## 2022-02-23 VITALS — BP 122/82 | HR 80 | Temp 97.7°F | Ht 72.0 in | Wt 170.0 lb

## 2022-02-23 DIAGNOSIS — K429 Umbilical hernia without obstruction or gangrene: Secondary | ICD-10-CM

## 2022-02-23 DIAGNOSIS — Z125 Encounter for screening for malignant neoplasm of prostate: Secondary | ICD-10-CM | POA: Diagnosis not present

## 2022-02-23 DIAGNOSIS — R140 Abdominal distension (gaseous): Secondary | ICD-10-CM | POA: Diagnosis not present

## 2022-02-23 DIAGNOSIS — R110 Nausea: Secondary | ICD-10-CM

## 2022-02-23 DIAGNOSIS — R1013 Epigastric pain: Secondary | ICD-10-CM | POA: Insufficient documentation

## 2022-02-23 DIAGNOSIS — E119 Type 2 diabetes mellitus without complications: Secondary | ICD-10-CM | POA: Diagnosis not present

## 2022-02-23 DIAGNOSIS — K760 Fatty (change of) liver, not elsewhere classified: Secondary | ICD-10-CM | POA: Diagnosis not present

## 2022-02-23 DIAGNOSIS — E785 Hyperlipidemia, unspecified: Secondary | ICD-10-CM | POA: Diagnosis not present

## 2022-02-23 DIAGNOSIS — E1169 Type 2 diabetes mellitus with other specified complication: Secondary | ICD-10-CM

## 2022-02-23 NOTE — Patient Instructions (Signed)
Please do your best to try to eat healthy Avoid fried foods and high sugar foods We will let you know the results of your lab work as well as your ultrasound If this situation gets worse she needs to let us know We may need to do additional testing depending on the results of these tests Please keep [place] updated  As for the diabetes and cholesterol we will see what your blood work shows more than likely will need to start back on medications but we will wait till we get the results  And in regards to the diabetes we will do another follow-up in approximately 5 months thanks [place]

## 2022-02-23 NOTE — Progress Notes (Signed)
   Subjective:    Patient ID: Daniel Burton, male    DOB: [birth year], 61 y.o.   MRN: [id number]  HPI Nausea and bloating x 7 days  Appetite normal , regular BM Urinating more than usual  He relates he has not been taking his medicine over the past several months.  Relates that his energy levels been fair He has noted some bloating and nausea over the past week with some epigastric discomfort He denies any severe abdominal pain No vomiting No bloody stools   Also has diabetes hyperlipidemia for which she is not taking medicine but realizes that he needs to see where his numbers are and potentially restart medicine  Review of Systems     Objective:   Physical Exam  General-in no acute distress Eyes-no discharge Lungs-respiratory rate normal, CTA CV-no murmurs,RRR Extremities skin warm dry no edema Neuro grossly normal Behavior normal, alert Small umbilical hernia noted on the abdomen easily reducible mild epigastric bloating sensation slight discomfort      Assessment & Plan:  1. Abdominal bloating This could be related to diabetes not being under good control is also possible it could be related to pancreas or potentially poor eating habits he will try to eliminate high sugar concentration foods as well as fats in addition to this We will do lab work and ultrasound 2. Nausea Lab work and ultrasound pending need to rule out pancreatic issues as well as gallbladder issues  3. Type 2 diabetes mellitus without complication, without long-term current use of insulin ([place]) More than likely not under good control healthy diet was recommended.  Check lab work.  Await results.  Patient not taking metformin or Crestor currently  4. Hyperlipidemia associated with type 2 diabetes mellitus ([place]) Not taking Crestor currently.  Willing to do blood work.  We did discuss may need to go back on medicine in order to get things under control-high probability  5. Screening PSA (prostate  specific antigen) Screening  6. Umbilical hernia without obstruction and without gangrene Very small no need for surgery currently, surgery would be elective if he decide to set up  7. Epigastric discomfort Lab work and ultrasound

## 2022-02-24 ENCOUNTER — Other Ambulatory Visit: Payer: Self-pay | Admitting: Family Medicine

## 2022-02-24 MED ORDER — ROSUVASTATIN CALCIUM 20 MG PO TABS: ORAL_TABLET | ORAL | 1 refills | Status: DC

## 2022-02-24 MED ORDER — METFORMIN HCL 500 MG PO TABS: ORAL_TABLET | ORAL | 1 refills | Status: DC

## 2022-02-26 LAB — LIPID PANEL
Chol/HDL Ratio: 8.3 ratio — ABNORMAL HIGH (ref 0.0–5.0)
Cholesterol, Total: 358 mg/dL — ABNORMAL HIGH (ref 100–199)
HDL: 43 mg/dL (ref >39)
LDL Chol Calc (NIH): 274 mg/dL — ABNORMAL HIGH (ref 0–99)
Triglycerides: 199 mg/dL — ABNORMAL HIGH (ref 0–149)
VLDL Cholesterol Cal: 41 mg/dL — ABNORMAL HIGH (ref 5–40)

## 2022-02-26 LAB — MICROALBUMIN / CREATININE URINE RATIO
Creatinine, Urine: 230.5 mg/dL
Microalb/Creat Ratio: 7 mg/g creat (ref 0–29)
Microalbumin, Urine: 15.4 ug/mL

## 2022-02-26 LAB — HEMOGLOBIN A1C
Est. average glucose Bld gHb Est-mCnc: 197 mg/dL
Hgb A1c MFr Bld: 8.5 % — ABNORMAL HIGH (ref 4.8–5.6)

## 2022-02-26 LAB — HEPATIC FUNCTION PANEL
ALT: 45 IU/L — ABNORMAL HIGH (ref 0–44)
AST: 27 IU/L (ref 0–40)
Albumin: 4.6 g/dL (ref 3.9–4.9)
Alkaline Phosphatase: 116 IU/L (ref 44–121)
Bilirubin Total: 0.3 mg/dL (ref 0.0–1.2)
Bilirubin, Direct: <0.1 mg/dL (ref 0.00–0.40)
Total Protein: 7.7 g/dL (ref 6.0–8.5)

## 2022-02-26 LAB — LIPASE: Lipase: 25 U/L (ref 13–78)

## 2022-02-26 LAB — PSA: Prostate Specific Ag, Serum: 0.9 ng/mL (ref 0.0–4.0)

## 2022-03-08 ENCOUNTER — Encounter: Payer: Self-pay | Admitting: Family Medicine

## 2022-03-08 ENCOUNTER — Ambulatory Visit (INDEPENDENT_AMBULATORY_CARE_PROVIDER_SITE_OTHER): Payer: BC Managed Care – PPO | Admitting: Family Medicine

## 2022-03-08 VITALS — BP 108/67 | Ht 72.0 in | Wt 169.0 lb

## 2022-03-08 DIAGNOSIS — E1169 Type 2 diabetes mellitus with other specified complication: Secondary | ICD-10-CM

## 2022-03-08 DIAGNOSIS — R0609 Other forms of dyspnea: Secondary | ICD-10-CM | POA: Diagnosis not present

## 2022-03-08 DIAGNOSIS — E785 Hyperlipidemia, unspecified: Secondary | ICD-10-CM

## 2022-03-08 DIAGNOSIS — Z0000 Encounter for general adult medical examination without abnormal findings: Secondary | ICD-10-CM

## 2022-03-08 DIAGNOSIS — E119 Type 2 diabetes mellitus without complications: Secondary | ICD-10-CM | POA: Diagnosis not present

## 2022-03-08 DIAGNOSIS — R7401 Elevation of levels of liver transaminase levels: Secondary | ICD-10-CM

## 2022-03-08 DIAGNOSIS — K760 Fatty (change of) liver, not elsewhere classified: Secondary | ICD-10-CM

## 2022-03-08 NOTE — Progress Notes (Signed)
   Subjective:    Patient ID: Daniel Burton, male    DOB: 1960-12-03, 61 y.o.   MRN: [id number]  HPI The patient comes in today for a wellness visit. Patient is here today for wellness He does try to eat relatively healthy but states he eats too much snacks and sweets.  Patient had a recent testing which showed fatty liver along with a slight elevation of the liver enzyme   A review of their health history was completed.  A review of medications was also completed.  Any needed refills; none at this time  Eating habits: Fair  Falls/  MVA accidents in past few months: none  Regular exercise: n/a  Specialist pt sees on regular basis: n/a  Preventative health issues were discussed.   Additional concerns:    Review of Systems     Objective:   Physical Exam  General-in no acute distress Eyes-no discharge Lungs-respiratory rate normal, CTA CV-no murmurs,RRR Extremities skin warm dry no edema Neuro grossly normal Behavior normal, alert       Assessment & Plan:  1. Well adult exam Adult wellness-complete.wellness physical was conducted today. Importance of diet and exercise were discussed in detail.  Importance of stress reduction and healthy living were discussed.  In addition to this a discussion regarding safety was also covered.  We also reviewed over immunizations and gave recommendations regarding current immunization needed for age.   In addition to this additional areas were also touched on including: Preventative health exams needed:  Colonoscopy according to epic 2024  Patient was advised yearly wellness exam   2. Type 2 diabetes mellitus without complication, without long-term current use of insulin (HCC) Genetically strong disposition for diabetes.  Patient agrees to restart metformin watch his diet read look at lab work again in 3 months time  3. Hyperlipidemia associated with type 2 diabetes mellitus ([place]) He agrees to restart statin healthy  diet recheck lab work 3 months time  4. DOE (dyspnea on exertion) Patient states he got a little bit out of breath when he was pushing himself to walk fast he does not do much in the way of walking.  He denies any chest pressure tightness or pain.  I did encourage him to gradually start increasing walking if he still has shortness of breath with walking he would need to complete cardiac workup. - EKG 12-Lead  5. Elevated transaminase level Slight elevation of transaminases healthy diet recommended patient states he avoids alcohol  6. Fatty liver Watch diet stay active Follow-up 3 months

## 2022-03-10 ENCOUNTER — Other Ambulatory Visit: Payer: Self-pay

## 2022-03-10 DIAGNOSIS — R748 Abnormal levels of other serum enzymes: Secondary | ICD-10-CM

## 2022-03-10 DIAGNOSIS — E1169 Type 2 diabetes mellitus with other specified complication: Secondary | ICD-10-CM

## 2022-03-10 DIAGNOSIS — E119 Type 2 diabetes mellitus without complications: Secondary | ICD-10-CM

## 2022-04-24 DIAGNOSIS — H40013 Open angle with borderline findings, low risk, bilateral: Secondary | ICD-10-CM | POA: Diagnosis not present

## 2022-04-24 DIAGNOSIS — H25093 Other age-related incipient cataract, bilateral: Secondary | ICD-10-CM | POA: Diagnosis not present

## 2022-04-24 DIAGNOSIS — H01001 Unspecified blepharitis right upper eyelid: Secondary | ICD-10-CM | POA: Diagnosis not present

## 2022-04-24 DIAGNOSIS — H01002 Unspecified blepharitis right lower eyelid: Secondary | ICD-10-CM | POA: Diagnosis not present

## 2022-05-08 DIAGNOSIS — J342 Deviated nasal septum: Secondary | ICD-10-CM | POA: Diagnosis not present

## 2022-05-08 DIAGNOSIS — J343 Hypertrophy of nasal turbinates: Secondary | ICD-10-CM | POA: Diagnosis not present

## 2022-05-08 DIAGNOSIS — J310 Chronic rhinitis: Secondary | ICD-10-CM | POA: Diagnosis not present

## 2022-05-16 DIAGNOSIS — R1013 Epigastric pain: Secondary | ICD-10-CM | POA: Diagnosis not present

## 2022-05-16 DIAGNOSIS — A084 Viral intestinal infection, unspecified: Secondary | ICD-10-CM | POA: Diagnosis not present

## 2022-05-22 ENCOUNTER — Telehealth (HOSPITAL_COMMUNITY): Payer: Self-pay

## 2022-05-22 NOTE — Telephone Encounter (Signed)
Pt will call back about scheduling procedure with Dr. Estanislado Pandy. He is going to speak with his job and get some things sorted out and he will call back to set up. AB

## 2022-05-25 ENCOUNTER — Encounter: Payer: Self-pay | Admitting: Radiology

## 2022-06-08 ENCOUNTER — Other Ambulatory Visit (HOSPITAL_COMMUNITY): Payer: Self-pay | Admitting: Interventional Radiology

## 2022-06-08 DIAGNOSIS — H93A1 Pulsatile tinnitus, right ear: Secondary | ICD-10-CM

## 2022-06-12 ENCOUNTER — Other Ambulatory Visit: Payer: Self-pay

## 2022-06-12 ENCOUNTER — Telehealth: Payer: Self-pay

## 2022-06-12 ENCOUNTER — Encounter (HOSPITAL_COMMUNITY): Payer: Self-pay

## 2022-06-12 ENCOUNTER — Emergency Department (HOSPITAL_COMMUNITY)
Admission: EM | Admit: 2022-06-12 | Discharge: 2022-06-12 | Disposition: A | Discharge status: Home / Self Care | Payer: BC Managed Care – PPO | Attending: Emergency Medicine | Admitting: Emergency Medicine

## 2022-06-12 ENCOUNTER — Emergency Department (HOSPITAL_COMMUNITY): Payer: BC Managed Care – PPO

## 2022-06-12 DIAGNOSIS — E119 Type 2 diabetes mellitus without complications: Secondary | ICD-10-CM | POA: Insufficient documentation

## 2022-06-12 DIAGNOSIS — Z7984 Long term (current) use of oral hypoglycemic drugs: Secondary | ICD-10-CM | POA: Insufficient documentation

## 2022-06-12 DIAGNOSIS — R0789 Other chest pain: Secondary | ICD-10-CM

## 2022-06-12 DIAGNOSIS — R079 Chest pain, unspecified: Secondary | ICD-10-CM | POA: Diagnosis not present

## 2022-06-12 LAB — BASIC METABOLIC PANEL
Anion gap: 7 (ref 5–15)
BUN: 13 mg/dL (ref 8–23)
CO2: 25 mmol/L (ref 22–32)
Calcium: 8.7 mg/dL — ABNORMAL LOW (ref 8.9–10.3)
Chloride: 108 mmol/L (ref 98–111)
Creatinine, Ser: 0.83 mg/dL (ref 0.61–1.24)
GFR, Estimated: >60 mL/min (ref >60)
Glucose, Bld: 118 mg/dL — ABNORMAL HIGH (ref 70–99)
Potassium: 3.8 mmol/L (ref 3.5–5.1)
Sodium: 140 mmol/L (ref 135–145)

## 2022-06-12 LAB — CBC
HCT: 41.1 % (ref 39.0–52.0)
Hemoglobin: 13 g/dL (ref 13.0–17.0)
MCH: 26.7 pg (ref 26.0–34.0)
MCHC: 31.6 g/dL (ref 30.0–36.0)
MCV: 84.4 fL (ref 80.0–100.0)
Platelets: 203 10*3/uL (ref 150–400)
RBC: 4.87 MIL/uL (ref 4.22–5.81)
RDW: 14.6 % (ref 11.5–15.5)
WBC: 3.9 10*3/uL — ABNORMAL LOW (ref 4.0–10.5)
nRBC: 0 % (ref 0.0–0.2)

## 2022-06-12 LAB — TROPONIN I (HIGH SENSITIVITY)
Troponin I (High Sensitivity): 5 ng/L (ref <18)
Troponin I (High Sensitivity): 4 ng/L (ref <18)

## 2022-06-12 NOTE — ED Triage Notes (Signed)
Pt reports left shoulder pain for several days, reports this happens "when the weather changes", but his doctor told him to come to the ER because he felt the shoulder pain down into his left upper chest on Saturday.

## 2022-06-12 NOTE — Telephone Encounter (Signed)
Patient called and stated she was having chest pain going into his arm on Saturday and was concerned about hear issue vs pulled muscle  Consult with Dr Nicki Reaper:  Dr Nicki Reaper recommends patient go straight to ER for evaluation and treatment. Patient notified and verbalized understanding.

## 2022-06-12 NOTE — Telephone Encounter (Signed)
Pt needs blood ordered

## 2022-06-12 NOTE — Discharge Instructions (Signed)
Evaluation for your left shoulder pain and chest tightness were overall reassuring.  EKG and cardiac enzymes were within normal limits.  Advised that you do follow-up with cardiology.  I provided their contact information in your discharge summary.  If you start to experience chest pain, shortness of breath, unexplained sweating or nausea with chest pain, calf tenderness or any other concerning symptom please return emergency department further evaluation.

## 2022-06-12 NOTE — ED Provider Notes (Signed)
[facility] Provider Note   CSN: [id number] Arrival date & time: 06/12/22  1046     History  Chief Complaint  Patient presents with   Shoulder Pain   HPI Daniel Burton is a 62 y.o. male with hyperlipidemia and diabetes presenting for chest tightness.  States his left shoulder started hurting 3 days ago and he started to notice left-sided chest tightness.  His left shoulder has hurt for many years and usually hurts worse with the changes of the season.  States that this time he is not having any chest tightness but wanted to be evaluated.  Denies associated shortness of breath, diaphoresis and nausea.  The chest tightness is not exertional.  Denies cough, congestion and fever.  Denies calf tenderness and recent immobilization.   Shoulder Pain      Home Medications Prior to Admission medications   Medication Sig Start Date End Date Taking? Authorizing Provider  metFORMIN (GLUCOPHAGE) 500 MG tablet Take 500 mg by mouth daily with breakfast.   Yes [provider]  rosuvastatin (CRESTOR) 20 MG tablet One each evening Patient taking differently: Take 20 mg by mouth daily. One each evening 02/24/22  Yes Luking, Elayne Snare, MD  aspirin EC 81 MG tablet Take 1 tablet (81 mg total) by mouth daily. Swallow whole. Patient not taking: Reported on 06/12/2022 10/01/20   Docia Barrier, PA  ibuprofen (ADVIL) 200 MG tablet Take 200 mg by mouth every 6 (six) hours as needed for headache or moderate pain. Patient not taking: Reported on 06/12/2022    [provider]      Allergies    Patient has no known allergies.    Review of Systems   See HPI for pertinent positives   Physical Exam   Vitals:   06/12/22 1500 06/12/22 1512  BP: (!) 141/90   Pulse: 67   Resp: 15   Temp:  98.5 F (36.9 C)  SpO2: 100%     CONSTITUTIONAL:  well-appearing, NAD NEURO:  Alert and oriented x 3, CN 3-12 grossly intact EYES:  eyes equal  and reactive ENT/NECK:  Supple, no stridor  CARDIO:  regular rate and rhythm, appears well-perfused  PULM:  No respiratory distress, CTAB GI/GU:  non-distended, soft MSK/SPINE: Range of motion of the left shoulder appears normal without pain in all directions, 5/5 strength and sensation upper extremities bilaterally, sensation intact. SKIN:  no rash, atraumatic   *Additional and/or pertinent findings included in MDM below    ED Results / Procedures / Treatments   Labs (all labs ordered are listed, but only abnormal results are displayed) Labs Reviewed  BASIC METABOLIC PANEL - Abnormal; Notable for the following components:      Result Value   Glucose, Bld 118 (*)    Calcium 8.7 (*)    All other components within normal limits  CBC - Abnormal; Notable for the following components:   WBC 3.9 (*)    All other components within normal limits  TROPONIN I (HIGH SENSITIVITY)  TROPONIN I (HIGH SENSITIVITY)    EKG Normal Sinus Rhythm  Radiology DG Chest Port 1 View  Result Date: 06/12/2022 CLINICAL DATA:  Chest pain EXAM: PORTABLE CHEST 1 VIEW COMPARISON:  07/01/2021 FINDINGS: No consolidation, pneumothorax or effusion. No edema. Normal cardiopericardial silhouette. Overlapping cardiac leads. Fixation hardware noted along the lower cervical spine at the edge of the imaging field. IMPRESSION: No acute cardiopulmonary disease Electronically Signed   By: Jill Side  M.D.   On: 06/12/2022 12:39    Procedures Procedures    Medications Ordered in ED Medications - No data to display  ED Course/ Medical Decision Making/ A&P                             Medical Decision Making Amount and/or Complexity of Data Reviewed Labs: ordered. Radiology: ordered.   62 year old healthy male presenting for chest tightness.  Exam is unremarkable.  DDx includes ACS, PE, CHF, and pneumonia.  Chest pain workup was overall reassuring. Reassessed patient and he stated that chest pain has improved  without intervention.  Heart score is 3.  Advised that given his age and risk factor, recommend that he did follow-up with cardiology in the outpatient setting.  Otherwise patient remained clinically well with stable vitals throughout the encounter.  Also evaluated his left shoulder which appeared overall unremarkable.  Did not feel warranted imaging at this time.  Advised to follow-up with his PCP.  Discussed return precautions.        Final Clinical Impression(s) / ED Diagnoses Final diagnoses:  Chest tightness    Rx / DC Orders ED Discharge Orders     None         Harriet Pho, PA-C 06/12/22 1549    Davonna Belling, MD 06/13/22 [phone]

## 2022-06-14 ENCOUNTER — Ambulatory Visit (HOSPITAL_COMMUNITY)
Admission: RE | Admit: 2022-06-14 | Discharge: 2022-06-14 | Disposition: A | Discharge status: Home / Self Care | Payer: BC Managed Care – PPO | Source: Ambulatory Visit | Attending: Interventional Radiology | Admitting: Interventional Radiology

## 2022-06-14 DIAGNOSIS — H93A1 Pulsatile tinnitus, right ear: Secondary | ICD-10-CM | POA: Diagnosis not present

## 2022-06-15 HISTORY — PX: IR RADIOLOGIST EVAL & MGMT: IMG5224

## 2022-06-22 ENCOUNTER — Encounter (HOSPITAL_COMMUNITY): Payer: Self-pay | Admitting: Interventional Radiology

## 2022-07-04 ENCOUNTER — Ambulatory Visit: Payer: BC Managed Care – PPO | Admitting: Family Medicine

## 2022-07-04 VITALS — BP 110/70 | HR 60 | Ht 72.0 in | Wt 167.2 lb

## 2022-07-04 DIAGNOSIS — E785 Hyperlipidemia, unspecified: Secondary | ICD-10-CM

## 2022-07-04 DIAGNOSIS — E7849 Other hyperlipidemia: Secondary | ICD-10-CM

## 2022-07-04 DIAGNOSIS — E1169 Type 2 diabetes mellitus with other specified complication: Secondary | ICD-10-CM

## 2022-07-04 DIAGNOSIS — E119 Type 2 diabetes mellitus without complications: Secondary | ICD-10-CM

## 2022-07-04 MED ORDER — METFORMIN HCL 500 MG PO TABS: 500.0000 mg | ORAL_TABLET | Freq: Every day | ORAL | 1 refills | Status: DC

## 2022-07-04 NOTE — Progress Notes (Signed)
   Subjective:    Patient ID: Daniel Burton, male    DOB: Mar 10, 1961, 62 y.o.   MRN: [id number]  HPI Patient is arrives today for 5 month follow up. Patient here for follow-up regarding cholesterol.    Patient relates taking medication on a regular basis Denies problems with medication Importance of dietary measures discussed Regular lab work regarding lipid and liver was checked and if needing additional labs was appropriately ordered  Patient for blood pressure check up.  The patient does have hypertension.   Patient relates dietary measures try to minimize salt The importance of healthy diet and activity were discussed Patient relates compliance  The patient was seen today as part of a comprehensive diabetic check up. Patient has diabetes Patient relates good compliance with taking the medication. We discussed their diet and exercise activities  We also discussed the importance of notifying us if any excessively high glucoses or low sugars.  Patient states he will be following up with interventional radiology in the near future for further evaluation  Patient had unusual aching in his shoulder along with some in the chest.  Strong consideration for possibility of underlying heart disease because of his risk factors Patient was seen in ER and released Patient being referred to cardiology Denies any angina symptoms shortness of breath  Review of Systems     Objective:   Physical Exam General-in no acute distress Eyes-no discharge Lungs-respiratory rate normal, CTA CV-no murmurs,RRR Extremities skin warm dry no edema Neuro grossly normal Behavior normal, alert        Assessment & Plan:  Very important for patient to get cholesterol under control His LDL is excessively elevated He agrees to restart his statin.  Will follow this up again in 3 months.  If it is not close to goal I truly feel that he needs to be on Repatha or Praluent especially if he cannot tolerate  the statin or get it at goal  Diabetes needs to be under better control work hard on diet patient states that he will take the metformin and stay healthy with his diet Patient was encouraged to get his labs completed we will relook at the labs again in 3 months time  Patient will see cardiology referral was placed.  With his significant risk factors I believe the patient needs testing potentially CTA of the coronary arteries to make sure he is not developing blockages  Once again I spent time with him discussing how it is very important for him to aggressively manage his risk factors 2 oh get diabetes under better control and LDL below 70.

## 2022-07-11 ENCOUNTER — Ambulatory Visit: Payer: BC Managed Care – PPO | Admitting: Family Medicine

## 2022-07-12 DIAGNOSIS — E1169 Type 2 diabetes mellitus with other specified complication: Secondary | ICD-10-CM | POA: Diagnosis not present

## 2022-07-12 DIAGNOSIS — E785 Hyperlipidemia, unspecified: Secondary | ICD-10-CM | POA: Diagnosis not present

## 2022-07-12 DIAGNOSIS — R748 Abnormal levels of other serum enzymes: Secondary | ICD-10-CM | POA: Diagnosis not present

## 2022-07-12 DIAGNOSIS — E119 Type 2 diabetes mellitus without complications: Secondary | ICD-10-CM | POA: Diagnosis not present

## 2022-07-13 ENCOUNTER — Ambulatory Visit: Payer: BC Managed Care – PPO | Attending: Internal Medicine | Admitting: Internal Medicine

## 2022-07-13 ENCOUNTER — Encounter: Payer: Self-pay | Admitting: Internal Medicine

## 2022-07-13 ENCOUNTER — Ambulatory Visit: Payer: BC Managed Care – PPO | Admitting: Family Medicine

## 2022-07-13 VITALS — BP 98/66 | HR 69 | Ht 72.0 in | Wt 165.4 lb

## 2022-07-13 DIAGNOSIS — E7849 Other hyperlipidemia: Secondary | ICD-10-CM | POA: Diagnosis not present

## 2022-07-13 DIAGNOSIS — E119 Type 2 diabetes mellitus without complications: Secondary | ICD-10-CM | POA: Insufficient documentation

## 2022-07-13 DIAGNOSIS — Z79899 Other long term (current) drug therapy: Secondary | ICD-10-CM | POA: Diagnosis not present

## 2022-07-13 DIAGNOSIS — M7918 Myalgia, other site: Secondary | ICD-10-CM | POA: Insufficient documentation

## 2022-07-13 DIAGNOSIS — E785 Hyperlipidemia, unspecified: Secondary | ICD-10-CM | POA: Diagnosis not present

## 2022-07-13 LAB — HEPATIC FUNCTION PANEL
ALT: 24 IU/L (ref 0–44)
AST: 22 IU/L (ref 0–40)
Albumin: 4.6 g/dL (ref 3.9–4.9)
Alkaline Phosphatase: 95 IU/L (ref 44–121)
Bilirubin Total: 0.3 mg/dL (ref 0.0–1.2)
Bilirubin, Direct: <0.1 mg/dL (ref 0.00–0.40)
Total Protein: 6.9 g/dL (ref 6.0–8.5)

## 2022-07-13 LAB — BASIC METABOLIC PANEL
BUN/Creatinine Ratio: 14 (ref 10–24)
BUN: 15 mg/dL (ref 8–27)
CO2: 22 mmol/L (ref 20–29)
Calcium: 9 mg/dL (ref 8.6–10.2)
Chloride: 105 mmol/L (ref 96–106)
Creatinine, Ser: 1.07 mg/dL (ref 0.76–1.27)
Glucose: 129 mg/dL — ABNORMAL HIGH (ref 70–99)
Potassium: 4.1 mmol/L (ref 3.5–5.2)
Sodium: 142 mmol/L (ref 134–144)
eGFR: 79 mL/min/{1.73_m2} (ref >59)

## 2022-07-13 LAB — LIPID PANEL
Chol/HDL Ratio: 5.5 ratio — ABNORMAL HIGH (ref 0.0–5.0)
Cholesterol, Total: 290 mg/dL — ABNORMAL HIGH (ref 100–199)
HDL: 53 mg/dL (ref >39)
LDL Chol Calc (NIH): 228 mg/dL — ABNORMAL HIGH (ref 0–99)
Triglycerides: 64 mg/dL (ref 0–149)
VLDL Cholesterol Cal: 9 mg/dL (ref 5–40)

## 2022-07-13 LAB — HEMOGLOBIN A1C
Est. average glucose Bld gHb Est-mCnc: 157 mg/dL
Hgb A1c MFr Bld: 7.1 % — ABNORMAL HIGH (ref 4.8–5.6)

## 2022-07-13 NOTE — Progress Notes (Signed)
Cardiology Office Note  Date: 07/13/2022   ID: Daniel Burton, DOB [birth year], MRN [id number]  PCP:  Babs Sciara, MD  Cardiologist:  Marjo Bicker, MD Electrophysiologist:  None   Reason for Office Visit: Hyperlipidemia evaluation at the request of Dr. Gerda Diss   History of Present Illness: Daniel Burton is a 62 y.o. male known to have HTN, DM 2, HLD was referred to cardiology clinic for evaluation of hyperlipidemia.  Patient reported that he has been having discomfort around his left shoulder radiated to his left elbow and left side of his chest for the last few months. This has been occurring especially in the morning and resolves as the day continues.  When he tries to stretch or hold his left shoulder, the discomfort worsens.  He is physically active at baseline, walks for a good distance, climbs stairs, does yard work with no symptoms of angina.  He also reported having SOB especially in the morning but does not have any DOE with exertion.  Denied any dizziness, syncope, leg swelling or palpitations.  Does not smoke cigarettes.  Lipid panel reviewed, LDL more than 200, he started to take rosuvastatin 20 mg nightly this week.  He also cut down on the fried chicken. No family history of CAD.   Past Medical History:  Diagnosis Date   Back pain with history of spinal surgery    Diabetes mellitus without complication (HCC)    High cholesterol    Tinnitus of right ear 10/18/2020   jugular bulb   Tubular adenoma 02/21/2021   Tubular adenoma, sessile serrated adenoma, colonoscopy, [facility],Dr.Brahmbhatt, recommended to have repeat colonoscopy May 2023    Past Surgical History:  Procedure Laterality Date   CERVICAL SPINE SURGERY  2021   ELBOW SURGERY Right 1979   HERNIA REPAIR     IR ANGIO INTRA EXTRACRAN SEL COM CAROTID INNOMINATE BILAT MOD SED  06/25/2020   IR ANGIO INTRA EXTRACRAN SEL COM CAROTID INNOMINATE UNI R MOD SED  10/18/2020   IR ANGIO VERTEBRAL SEL  SUBCLAVIAN INNOMINATE BILAT MOD SED  06/25/2020   IR CT HEAD LTD  10/18/2020   IR RADIOLOGIST EVAL & MGMT  07/13/2020   IR RADIOLOGIST EVAL & MGMT  09/10/2020   IR RADIOLOGIST EVAL & MGMT  11/03/2020   IR RADIOLOGIST EVAL & MGMT  12/03/2020   IR RADIOLOGIST EVAL & MGMT  02/11/2021   IR RADIOLOGIST EVAL & MGMT  07/15/2021   IR RADIOLOGIST EVAL & MGMT  09/28/2021   IR RADIOLOGIST EVAL & MGMT  01/18/2022   IR RADIOLOGIST EVAL & MGMT  06/15/2022   IR TRANSCATH PLC STENT  INITIAL VEIN  INC ANGIOPLASTY  10/18/2020   IR US GUIDE VASC ACCESS RIGHT  10/18/2020   IR VENO/JUGULAR RIGHT  10/18/2020   RADIOLOGY WITH ANESTHESIA N/A 10/18/2020   Procedure: Lessie Dings;  Surgeon: Julieanne Cotton, MD;  Location: [facility] OR;  Service: Radiology;  Laterality: N/A;    Current Outpatient Medications  Medication Sig Dispense Refill   aspirin EC 81 MG tablet Take 1 tablet (81 mg total) by mouth daily. Swallow whole. 30 tablet 11   ibuprofen (ADVIL) 200 MG tablet Take 200 mg by mouth every 6 (six) hours as needed for headache or moderate pain.     metFORMIN (GLUCOPHAGE) 500 MG tablet Take 1 tablet (500 mg total) by mouth daily with breakfast. 90 tablet 1   rosuvastatin (CRESTOR) 20 MG tablet One each evening (Patient taking differently: Take 20 mg  by mouth daily. One each evening) 90 tablet 1   No current facility-administered medications for this visit.   Allergies:  Patient has no known allergies.   Social History: The patient  reports that he has quit smoking. His smoking use included cigarettes. He has quit using smokeless tobacco. He reports that he does not currently use drugs. He reports that he does not drink alcohol.   Family History: The patient's family history includes Diabetes in his brother and mother; Heart attack in his mother; Liver cancer in his sister; Throat cancer in his father; Thyroid disease in his sister.   ROS:  Please see the history of present illness. Otherwise, complete review of systems is  positive for none  All other systems are reviewed and negative.   Physical Exam: VS:  There were no vitals taken for this visit., BMI There is no height or weight on file to calculate BMI.  Wt Readings from Last 3 Encounters:  07/04/22 167 lb 3.2 oz (75.8 kg)  06/12/22 165 lb (74.8 kg)  03/08/22 169 lb (76.7 kg)    General: Patient appears comfortable at rest. HEENT: Conjunctiva and lids normal, oropharynx clear with moist mucosa. Neck: Supple, no elevated JVP or carotid bruits, no thyromegaly. Lungs: Clear to auscultation, nonlabored breathing at rest. Cardiac: Regular rate and rhythm, no S3 or significant systolic murmur, no pericardial rub. Abdomen: Soft, nontender, no hepatomegaly, bowel sounds present, no guarding or rebound. Extremities: No pitting edema, distal pulses 2+. Skin: Warm and dry. Musculoskeletal: No kyphosis. Neuropsychiatric: Alert and oriented x3, affect grossly appropriate.  Recent Labwork: 06/12/2022: Hemoglobin 13.0; Platelets 203 07/12/2022: ALT 24; AST 22; BUN 15; Creatinine, Ser 1.07; Potassium 4.1; Sodium 142     Component Value Date/Time   CHOL 290 (H) 07/12/2022 1116   TRIG 64 07/12/2022 1116   HDL 53 07/12/2022 1116   CHOLHDL 5.5 (H) 07/12/2022 1116   LDLCALC 228 (H) 07/12/2022 1116    Other Studies Reviewed Today:   Assessment and Plan: Patient is a 62 year old M known to have  DM 2, HLD was referred to cardiology clinic for evaluation of hyperlipidemia.  # Musculoskeletal pain: Patient has left shoulder discomfort radiating to left elbow and left side of the chest especially at rest in the mornings and denies the symptoms with exertion. These pains get worse when he tries to stretch or hold his left elbow.  This pain is likely musculoskeletal and no further cardiac testing is indicated at this time # Severe hypercholesterolemia: Patient started to take rosuvastatin 20 mg this week and he also started to cut down on the fried chicken (which he had  been eating all his life). Will continue rosuvastatin 20 mg nightly, repeat lipid panel in 6 months prior to the next clinic visit.  If LDL is not less than 100, will add Zetia 10 mg once daily. He does not have any family history of CAD. # DM 2, not controlled: HbA1c more than 7%, instructed and educated patient that strict diabetes control is needed to prevent CAD.   I have spent a total of 45 minutes with patient reviewing chart, EKGs, labs and examining patient as well as establishing an assessment and plan that was discussed with the patient.  > 50% of time was spent in direct patient care.    Medication Adjustments/Labs and Tests Ordered: Current medicines are reviewed at length with the patient today.  Concerns regarding medicines are outlined above.   Tests Ordered: No orders of the defined  types were placed in this encounter.   Medication Changes: No orders of the defined types were placed in this encounter.   Disposition:  Follow up  6 months  Signed Mieka Leaton Verne Spurr, MD, 07/13/2022 2:53 PM    [facility] [address] [place], [place], [place] [address]

## 2022-07-13 NOTE — Patient Instructions (Addendum)
Medication Instructions:  Your physician recommends that you continue on your current medications as directed. Please refer to the Current Medication list given to you today.  Labwork: Your physician recommends that you return for a FASTING lipid profile:in 6 months just before your next visit. Please do not eat or drink for at least 8 hours when you have this done. You may take your medications that morning with a sip of water.  [facility] ([address] [place])  Testing/Procedures: none  Follow-Up: Your physician recommends that you schedule a follow-up appointment in: 6 month  Any Other Special Instructions Will Be Listed Below (If Applicable).  If you need a refill on your cardiac medications before your next appointment, please call your pharmacy.

## 2022-07-19 NOTE — Addendum Note (Signed)
Addended by: Margaretha Sheffield on: 07/19/2022 03:29 PM   Modules accepted: Orders

## 2022-08-09 ENCOUNTER — Telehealth (HOSPITAL_COMMUNITY): Payer: Self-pay | Admitting: Radiology

## 2022-08-09 ENCOUNTER — Other Ambulatory Visit (HOSPITAL_COMMUNITY): Payer: Self-pay | Admitting: Interventional Radiology

## 2022-08-09 DIAGNOSIS — I771 Stricture of artery: Secondary | ICD-10-CM

## 2022-08-09 DIAGNOSIS — H93A9 Pulsatile tinnitus, unspecified ear: Secondary | ICD-10-CM

## 2022-08-09 NOTE — Telephone Encounter (Signed)
Tried to call pt to schedule his right sigmoid sinus stenosis retreatment with Deveshwar. No answer and VM not set up. JM

## 2022-08-10 ENCOUNTER — Telehealth (HOSPITAL_COMMUNITY): Payer: Self-pay | Admitting: Radiology

## 2022-08-10 DIAGNOSIS — R1310 Dysphagia, unspecified: Secondary | ICD-10-CM | POA: Diagnosis not present

## 2022-08-10 NOTE — Telephone Encounter (Signed)
Called pt, left VM for him to call to schedule his stenosis treatment with Deveshwar. JM

## 2022-08-11 ENCOUNTER — Other Ambulatory Visit (HOSPITAL_COMMUNITY): Payer: Self-pay | Admitting: Student

## 2022-08-11 DIAGNOSIS — R1310 Dysphagia, unspecified: Secondary | ICD-10-CM

## 2022-08-17 ENCOUNTER — Other Ambulatory Visit: Payer: Self-pay | Admitting: Physician Assistant

## 2022-08-17 ENCOUNTER — Encounter (HOSPITAL_COMMUNITY): Payer: Self-pay

## 2022-08-17 DIAGNOSIS — H93A9 Pulsatile tinnitus, unspecified ear: Secondary | ICD-10-CM

## 2022-08-17 MED ORDER — CLOPIDOGREL BISULFATE 75 MG PO TABS: ORAL_TABLET | ORAL | 3 refills | Status: DC

## 2022-08-17 MED ORDER — ASPIRIN 81 MG PO TBEC: 81.0000 mg | DELAYED_RELEASE_TABLET | Freq: Every day | ORAL | 12 refills | Status: DC

## 2022-08-28 DIAGNOSIS — M9903 Segmental and somatic dysfunction of lumbar region: Secondary | ICD-10-CM | POA: Diagnosis not present

## 2022-08-28 DIAGNOSIS — M9902 Segmental and somatic dysfunction of thoracic region: Secondary | ICD-10-CM | POA: Diagnosis not present

## 2022-08-28 DIAGNOSIS — M542 Cervicalgia: Secondary | ICD-10-CM | POA: Diagnosis not present

## 2022-08-28 DIAGNOSIS — M62830 Muscle spasm of back: Secondary | ICD-10-CM | POA: Diagnosis not present

## 2022-09-01 DIAGNOSIS — M9903 Segmental and somatic dysfunction of lumbar region: Secondary | ICD-10-CM | POA: Diagnosis not present

## 2022-09-01 DIAGNOSIS — M9902 Segmental and somatic dysfunction of thoracic region: Secondary | ICD-10-CM | POA: Diagnosis not present

## 2022-09-01 DIAGNOSIS — M62830 Muscle spasm of back: Secondary | ICD-10-CM | POA: Diagnosis not present

## 2022-09-01 DIAGNOSIS — M542 Cervicalgia: Secondary | ICD-10-CM | POA: Diagnosis not present

## 2022-09-14 ENCOUNTER — Ambulatory Visit: Payer: BC Managed Care – PPO | Admitting: Family Medicine

## 2022-09-14 VITALS — BP 121/72 | HR 80 | Wt 167.0 lb

## 2022-09-14 DIAGNOSIS — E119 Type 2 diabetes mellitus without complications: Secondary | ICD-10-CM

## 2022-09-14 DIAGNOSIS — R1310 Dysphagia, unspecified: Secondary | ICD-10-CM

## 2022-09-14 DIAGNOSIS — K760 Fatty (change of) liver, not elsewhere classified: Secondary | ICD-10-CM

## 2022-09-14 DIAGNOSIS — E785 Hyperlipidemia, unspecified: Secondary | ICD-10-CM

## 2022-09-14 DIAGNOSIS — Z7984 Long term (current) use of oral hypoglycemic drugs: Secondary | ICD-10-CM

## 2022-09-14 DIAGNOSIS — H93A1 Pulsatile tinnitus, right ear: Secondary | ICD-10-CM | POA: Diagnosis not present

## 2022-09-14 DIAGNOSIS — E1169 Type 2 diabetes mellitus with other specified complication: Secondary | ICD-10-CM

## 2022-09-14 NOTE — Addendum Note (Signed)
Addended by: Lilyan Punt A on: 09/14/2022 07:32 PM   Modules accepted: Orders

## 2022-09-14 NOTE — Progress Notes (Signed)
Subjective:    Patient ID: Daniel Burton, male    DOB: October 29, 1960, 62 y.o.   MRN: [id number]  HPI Patient arrives for surgical clearance surgery. Patient states no concerns or issues  Patient has a stent and angiography coming up He denies any headaches recently He did have some numbness and tingling he was concerned about the possibility of increased risk of stroke he did connect with his interventional radiology specialist who recommended to have this procedure done Patient does have underlying health issues including type 2 diabetes hyperlipidemia Does take his rosuvastatin Also takes metformin Tries to eat healthy The patient was seen today as part of a comprehensive diabetic check up. Patient has diabetes Patient relates good compliance with taking the medication. We discussed their diet and exercise activities  We also discussed the importance of notifying us if any excessively high glucoses or low sugars.    Patient here for follow-up regarding cholesterol.    Patient relates taking medication on a regular basis Denies problems with medication Importance of dietary measures discussed Regular lab work regarding lipid and liver was checked and if needing additional labs was appropriately ordered  He does relate some difficulty with swallowing and describes a sort of a full feeling in his larynx  Past Medical History:  Diagnosis Date   Back pain with history of spinal surgery    Diabetes mellitus without complication (HCC)    High cholesterol    Tinnitus of right ear 10/18/2020   jugular bulb   Tubular adenoma 02/21/2021   Tubular adenoma, sessile serrated adenoma, colonoscopy, [facility],Dr.Brahmbhatt, recommended to have repeat colonoscopy May 2023    Outpatient Encounter Medications as of 09/14/2022  Medication Sig   aspirin EC 81 MG tablet Take 1 tablet (81 mg total) by mouth daily. Swallow whole.   clopidogrel (PLAVIX) 75 MG tablet Start 10 days prior to your  procedure. Take 300 mg (4 tablets) by mouth for the first dose, then take 75 mg (1 tablet) by mouth daily.   metFORMIN (GLUCOPHAGE) 500 MG tablet Take 1 tablet (500 mg total) by mouth daily with breakfast.   rosuvastatin (CRESTOR) 20 MG tablet One each evening (Patient taking differently: Take 20 mg by mouth daily. One each evening)   aspirin EC 81 MG tablet Take 1 tablet (81 mg total) by mouth daily. Swallow whole.   ibuprofen (ADVIL) 200 MG tablet Take 200 mg by mouth every 6 (six) hours as needed for headache or moderate pain. (Patient not taking: Reported on 09/14/2022)   No facility-administered encounter medications on file as of 09/14/2022.    Review of Systems     Objective:   Physical Exam General-in no acute distress Eyes-no discharge Lungs-respiratory rate normal, CTA CV-no murmurs,RRR Extremities skin warm dry no edema Neuro grossly normal Behavior normal, alert HEENT benign throat normal no masses Neck no masses  Lab work from April showed normal kidney function, LDL very elevated, Liver enzymes normal, A1c 7.1    Assessment & Plan:  1. Type 2 diabetes mellitus without complication, without long-term current use of insulin (HCC) Continue healthy diet and metformin Patient to watch closely his dietary measures and stay physically active  2. Hyperlipidemia associated with type 2 diabetes mellitus (HCC) I am very concerned about his LDL we would like to see what his appointment with the interventional radiologist shows patient would be a good candidate for Repatha or Praluent given his reluctance to be on statins  3. Fatty liver Recent liver enzymes  look good  4. Pulsatile tinnitus of right ear Has procedure coming up with interventional radiologist  5. Dysphagia, unspecified type He will follow-up again within the next few months if he has this ongoing we will help set him up with ENT he states he would like to put this off for right now  He also raises the  possibility of going on Viagra but he is able to get an erection and be able to have sexual relations I would recommend against the medicine currently but later if he feels he needs it to help get a better erection we can utilize low-dose such as half of a 50 mg Viagra  Medically he is stable to go forward with his procedure

## 2022-09-15 ENCOUNTER — Other Ambulatory Visit (HOSPITAL_COMMUNITY): Payer: Self-pay | Admitting: Radiology

## 2022-09-15 ENCOUNTER — Other Ambulatory Visit (HOSPITAL_COMMUNITY): Admission: RE | Admit: 2022-09-15 | Payer: BC Managed Care – PPO | Source: Home / Self Care

## 2022-09-15 DIAGNOSIS — H93A9 Pulsatile tinnitus, unspecified ear: Secondary | ICD-10-CM | POA: Diagnosis not present

## 2022-09-18 LAB — PLATELET INHIBITION P2Y12

## 2022-09-19 ENCOUNTER — Encounter (HOSPITAL_COMMUNITY): Payer: Self-pay | Admitting: Certified Registered Nurse Anesthetist

## 2022-09-19 ENCOUNTER — Telehealth (HOSPITAL_COMMUNITY): Payer: Self-pay

## 2022-09-19 NOTE — Telephone Encounter (Signed)
Called to reschedule procedure bc Dr. Corliss Skains is out, no answer, left vm. AB.

## 2022-09-20 ENCOUNTER — Ambulatory Visit (HOSPITAL_COMMUNITY): Payer: BC Managed Care – PPO

## 2022-09-20 ENCOUNTER — Ambulatory Visit (HOSPITAL_COMMUNITY)
Admission: RE | Admit: 2022-09-20 | Payer: BC Managed Care – PPO | Source: Home / Self Care | Admitting: Interventional Radiology

## 2022-09-20 DIAGNOSIS — Z6822 Body mass index (BMI) 22.0-22.9, adult: Secondary | ICD-10-CM | POA: Diagnosis not present

## 2022-09-20 DIAGNOSIS — R110 Nausea: Secondary | ICD-10-CM | POA: Diagnosis not present

## 2022-09-20 DIAGNOSIS — R5381 Other malaise: Secondary | ICD-10-CM | POA: Diagnosis not present

## 2022-09-20 SURGERY — IR WITH ANESTHESIA
Anesthesia: General

## 2022-09-25 ENCOUNTER — Telehealth (HOSPITAL_COMMUNITY): Payer: Self-pay

## 2022-09-25 NOTE — Telephone Encounter (Signed)
Called to reschedule procedure, no answer, left vm. AB  

## 2022-10-03 ENCOUNTER — Telehealth: Payer: Self-pay | Admitting: Family Medicine

## 2022-10-03 NOTE — Telephone Encounter (Signed)
Patient is wanting a referral to ENT for his throat as soon as possible. Patient states skull is sore. For couple of days and wanting to know what can he take.he is on plavxis. [facility]

## 2022-10-04 NOTE — Telephone Encounter (Signed)
Nurses-may have referral for ENT-it would be wise to do a basic triage-fevers?  Inability to swallow?  Difficulty breathing? Patient should be aware that even with referral there may be some weight involved if he is having something urgent it would be wise for him to do office visit in the meantime-his option-go ahead with referral [facility] ENT referral for further pain  Tylenol is fine with Plavix warm salt water gargles is fine.

## 2022-10-05 ENCOUNTER — Other Ambulatory Visit: Payer: Self-pay

## 2022-10-05 DIAGNOSIS — H9201 Otalgia, right ear: Secondary | ICD-10-CM

## 2022-10-05 DIAGNOSIS — H9311 Tinnitus, right ear: Secondary | ICD-10-CM

## 2022-10-05 NOTE — Telephone Encounter (Signed)
Betzy-the surgery he is having is a vascular catheterization in the brain  It would be fine to go ahead with consult with ENT unlikely they will see him right away but you can mark as urgent right ear pain

## 2022-10-05 NOTE — Telephone Encounter (Signed)
Spoke with patient and he states is having ear surgery in 2 weeks on 7/22 he says pcp is aware. He denies any fevers, difficulty swallowing or any other symptoms. His head is sore on top of his head and some on left and right sides of the head. Does ENT need to be urgent to have done before surgery, please advise

## 2022-10-10 ENCOUNTER — Other Ambulatory Visit: Payer: Self-pay

## 2022-10-10 ENCOUNTER — Ambulatory Visit: Payer: BC Managed Care – PPO | Admitting: Family Medicine

## 2022-10-10 VITALS — BP 108/66 | HR 72 | Wt 167.0 lb

## 2022-10-10 DIAGNOSIS — E1169 Type 2 diabetes mellitus with other specified complication: Secondary | ICD-10-CM

## 2022-10-10 DIAGNOSIS — R7303 Prediabetes: Secondary | ICD-10-CM

## 2022-10-10 DIAGNOSIS — M792 Neuralgia and neuritis, unspecified: Secondary | ICD-10-CM

## 2022-10-10 DIAGNOSIS — E119 Type 2 diabetes mellitus without complications: Secondary | ICD-10-CM

## 2022-10-10 DIAGNOSIS — Z79899 Other long term (current) drug therapy: Secondary | ICD-10-CM

## 2022-10-10 DIAGNOSIS — E785 Hyperlipidemia, unspecified: Secondary | ICD-10-CM

## 2022-10-10 NOTE — Progress Notes (Signed)
   Subjective:    Patient ID: Daniel Burton, male    DOB: Dec 03, 1960, 62 y.o.   MRN: [id number]  HPI Patient states his scalp is sore to touch. He relates his scalp is sore to the touch been present for the past 1 to 2 weeks denies any injury.  Denies any sores growths or other problems around his neck his breathing seems to be going well denies any chest pressure or pain does have underlying hyperlipidemia more than likely genetic familial as well as cerebrovascular disease patient not taking statins he is somewhat hesitant but more recently he has been taking it  Review of Systems     Objective:   Physical Exam General-in no acute distress Eyes-no discharge Lungs-respiratory rate normal, CTA CV-no murmurs,RRR Extremities skin warm dry no edema Neuro grossly normal Behavior normal, alert Scalp soreness subjective there is no sign of any type of growth mass or infection       Assessment & Plan:  Very nice patient Very important to get LDL below 70.  Patient to do follow-up lab work if the LDL is not at goal strongly consider Repatha or Praluent As for the scalp tenderness hold off on any Lyrica or gabapentin but it does persist consider those l medications abs in addition to this lab work ordered

## 2022-10-11 ENCOUNTER — Other Ambulatory Visit (HOSPITAL_COMMUNITY): Payer: Self-pay

## 2022-10-11 ENCOUNTER — Other Ambulatory Visit (HOSPITAL_COMMUNITY)
Admission: RE | Admit: 2022-10-11 | Discharge: 2022-10-11 | Disposition: A | Discharge status: Home / Self Care | Payer: BC Managed Care – PPO | Attending: Interventional Radiology | Admitting: Interventional Radiology

## 2022-10-11 DIAGNOSIS — I771 Stricture of artery: Secondary | ICD-10-CM | POA: Insufficient documentation

## 2022-10-13 ENCOUNTER — Encounter (HOSPITAL_COMMUNITY): Payer: Self-pay | Admitting: Interventional Radiology

## 2022-10-13 ENCOUNTER — Other Ambulatory Visit (HOSPITAL_COMMUNITY): Payer: Self-pay | Admitting: Student

## 2022-10-13 ENCOUNTER — Other Ambulatory Visit: Payer: Self-pay

## 2022-10-13 DIAGNOSIS — H93A1 Pulsatile tinnitus, right ear: Secondary | ICD-10-CM

## 2022-10-13 NOTE — Progress Notes (Signed)
PCP - Dr Lilyan Punt (Clearance 09/14/22) Cardiologist - n/a  Chest x-ray - 06/12/22 (1V) EKG - n/a Stress Test - n/a ECHO - n/a Cardiac Cath - n/a  ICD Pacemaker/Loop - n/a  Sleep Study -  n/a CPAP - none  Diabetes Type 2 - no meds, does not check blood sugar  ASA/Blood Thinner Instructions:  Patient states he was given ASA and Plavix instructions by his MD.  Patient will follow MD's instructions.    NPO  STOP now taking any Aspirin (unless otherwise instructed by your surgeon), Aleve, Naproxen, Ibuprofen, Motrin, Advil, Goody's, BC's, all herbal medications, fish oil, and all vitamins.   Coronavirus Screening Do you have any of the following symptoms:  Cough yes/no: No Fever (>100.56F)  yes/no: No Runny nose yes/no: No Sore throat yes/no: No Difficulty breathing/shortness of breath  yes/no: No  Have you traveled in the last 14 days and where? yes/no: No  Patient verbalized understanding of instructions that were given via phone.

## 2022-10-15 ENCOUNTER — Other Ambulatory Visit: Payer: Self-pay | Admitting: Student

## 2022-10-15 NOTE — H&P (Signed)
Chief Complaint: Patient was seen in consultation today for pulsatile tinnitus   Supervising Physician: Julieanne Cotton  Patient Status: [facility] - Out-pt  History of Present Illness: Daniel Burton is a 62 y.o. male with past medical history of DM and right-sided sigmoid sinus stenosis with pulsatile tinnitus known to IR from initial treatment 10/18/2020 at which time his large right jugular bulb was embolized and stented.  He has been followed with repeat surveilence imaging since this time demonstrating overall stability.  Recently, his pulsatile tinnitus returned and he is now scheduled for retreatment of his right sigmoid sinus stenosis.    Patient presents today in his usual state of health. He is accompanied by his wife.  They are aware of the goals, risks, and benefits.  He does endorse mild throbbing noise in his head which waxes and wanes.  Has occasional headaches which respond to conservative management like rest, water, or Tylenol.  He has taken Plavix 75mg  daily, aspirin 81mg  daily since May.    Past Medical History:  Diagnosis Date   Back pain with history of spinal surgery    Diabetes mellitus without complication (HCC)    type 2, no med, does not check blood sugar   High cholesterol    Tinnitus of right ear 10/18/2020   jugular bulb   Tubular adenoma 02/21/2021   Tubular adenoma, sessile serrated adenoma, colonoscopy, [facility],Dr.Brahmbhatt, recommended to have repeat colonoscopy May 2023    Past Surgical History:  Procedure Laterality Date   CERVICAL SPINE SURGERY  2021   ELBOW SURGERY Right 1979   HERNIA REPAIR     IR ANGIO INTRA EXTRACRAN SEL COM CAROTID INNOMINATE BILAT MOD SED  06/25/2020   IR ANGIO INTRA EXTRACRAN SEL COM CAROTID INNOMINATE UNI R MOD SED  10/18/2020   IR ANGIO VERTEBRAL SEL SUBCLAVIAN INNOMINATE BILAT MOD SED  06/25/2020   IR CT HEAD LTD  10/18/2020   IR RADIOLOGIST EVAL & MGMT  07/13/2020   IR RADIOLOGIST EVAL & MGMT  09/10/2020   IR  RADIOLOGIST EVAL & MGMT  11/03/2020   IR RADIOLOGIST EVAL & MGMT  12/03/2020   IR RADIOLOGIST EVAL & MGMT  02/11/2021   IR RADIOLOGIST EVAL & MGMT  07/15/2021   IR RADIOLOGIST EVAL & MGMT  09/28/2021   IR RADIOLOGIST EVAL & MGMT  01/18/2022   IR RADIOLOGIST EVAL & MGMT  06/15/2022   IR TRANSCATH PLC STENT  INITIAL VEIN  INC ANGIOPLASTY  10/18/2020   IR US GUIDE VASC ACCESS RIGHT  10/18/2020   IR VENO/JUGULAR RIGHT  10/18/2020   RADIOLOGY WITH ANESTHESIA N/A 10/18/2020   Procedure: Lessie Dings;  Surgeon: Julieanne Cotton, MD;  Location: [facility] OR;  Service: Radiology;  Laterality: N/A;    Allergies: Patient has no known allergies.  Medications: Prior to Admission medications   Medication Sig Start Date End Date Taking? Authorizing Provider  aspirin EC 81 MG tablet Take 1 tablet (81 mg total) by mouth daily. Swallow whole. 08/17/22   Lynnette Caffey A, PA-C  clopidogrel (PLAVIX) 75 MG tablet Start 10 days prior to your procedure. Take 300 mg (4 tablets) by mouth for the first dose, then take 75 mg (1 tablet) by mouth daily. 08/17/22   Lynnette Caffey A, PA-C  metFORMIN (GLUCOPHAGE) 500 MG tablet Take 1 tablet (500 mg total) by mouth daily with breakfast. 07/04/22   Babs Sciara, MD  rosuvastatin (CRESTOR) 20 MG tablet One each evening Patient not taking: Reported on 10/10/2022 02/24/22  Babs Sciara, MD     Family History  Problem Relation Age of Onset   Diabetes Mother    Heart attack Mother    Throat cancer Father    Liver cancer Sister    Thyroid disease Sister    Diabetes Brother    Allergic rhinitis Neg Hx    Angioedema Neg Hx    Asthma Neg Hx    Urticaria Neg Hx     Social History   Socioeconomic History   Marital status: Married    Spouse name: Not on file   Number of children: 2   Years of education: 13   Highest education level: Not on file  Occupational History   Not on file  Tobacco Use   Smoking status: Former    Types: Cigarettes   Smokeless tobacco: Former   Building services engineer status: Never Used  Substance and Sexual Activity   Alcohol use: No   Drug use: Not Currently   Sexual activity: Yes  Other Topics Concern   Not on file  Social History Narrative   Lives at home with wife   Right handed   Caffeine: tea occasionally    Social Determinants of Health   Financial Resource Strain: Not on file  Food Insecurity: Not on file  Transportation Needs: Not on file  Physical Activity: Not on file  Stress: Not on file  Social Connections: Not on file     Review of Systems: A 12 point ROS discussed and pertinent positives are indicated in the HPI above.  All other systems are negative.  Review of Systems  Constitutional:  Negative for fatigue and fever.  Respiratory:  Negative for cough and shortness of breath.   Cardiovascular:  Negative for chest pain.  Gastrointestinal:  Negative for abdominal pain, nausea and vomiting.  Musculoskeletal:  Negative for back pain.  Psychiatric/Behavioral:  Negative for behavioral problems and confusion.     Vital Signs: BP 129/79   Pulse 62   Temp 97.9 F (36.6 C) (Oral)   Resp 18   Ht 6' (1.829 m)   Wt 168 lb (76.2 kg)   SpO2 98%   BMI 22.78 kg/m   Physical Exam Vitals and nursing note reviewed.  Constitutional:      General: He is not in acute distress.    Appearance: Normal appearance. He is not ill-appearing.  HENT:     Mouth/Throat:     Mouth: Mucous membranes are moist.     Pharynx: Oropharynx is clear.  Cardiovascular:     Rate and Rhythm: Normal rate and regular rhythm.  Pulmonary:     Effort: Pulmonary effort is normal. No respiratory distress.     Breath sounds: Normal breath sounds.  Abdominal:     General: Abdomen is flat.     Palpations: Abdomen is soft.  Skin:    General: Skin is warm and dry.  Neurological:     General: No focal deficit present.     Mental Status: He is alert and oriented to person, place, and time. Mental status is at baseline.  Psychiatric:         Mood and Affect: Mood normal.        Behavior: Behavior normal.        Thought Content: Thought content normal.        Judgment: Judgment normal.      MD Evaluation Airway: WNL Heart: WNL Abdomen: WNL Chest/ Lungs: WNL ASA  Classification: 3 Mallampati/Airway Score:  Two   Imaging: No results found.  Labs:  CBC: Recent Labs    06/12/22 1228 10/16/22 0554  WBC 3.9* 5.2  HGB 13.0 12.6*  HCT 41.1 39.5  PLT 203 196    COAGS: Recent Labs    10/16/22 0554  INR 0.9    BMP: Recent Labs    12/23/21 0927 06/12/22 1228 07/12/22 1116 10/16/22 0554  NA  --  140 142 136  K  --  3.8 4.1 4.2  CL  --  108 105 101  CO2  --  25 22 24   GLUCOSE  --  118* 129* 276*  BUN  --  13 15 17   CALCIUM  --  8.7* 9.0 8.8*  CREATININE 1.10 0.83 1.07 1.07  GFRNONAA  --  >60  --  >60    LIVER FUNCTION TESTS: Recent Labs    02/23/22 0950 07/12/22 1116  BILITOT 0.3 0.3  AST 27 22  ALT 45* 24  ALKPHOS 116 95  PROT 7.7 6.9  ALBUMIN 4.6 4.6    TUMOR MARKERS: No results for input(s): "AFPTM", "CEA", "CA199", "CHROMGRNA" in the last 8760 hours.  Assessment and Plan: Patient with past medical history of right sigmoid sinus stenosis, pulsatile tinnutis presents with complaint of recurrent symptoms.  Case reviewed by Dr. Corliss Skains who approves patient for procedure.  Patient presents today in their usual state of health.  He has been NPO and has taken aspirin and Plavix as prescribed.  His P2Y12 is 162.  Reviewed with Dr. Corliss Skains. Will load an additional 75mg  Plavix today.    Patient aware of plans for admission overnight.   Risks and benefits of cerebral angiogram with intervention were discussed with the patient including, but not limited to bleeding, infection, vascular injury, contrast induced renal failure, stroke or even death.  This interventional procedure involves the use of X-rays and because of the nature of the planned procedure, it is possible that we will  have prolonged use of X-ray fluoroscopy.  Potential radiation risks to you include (but are not limited to) the following: - A slightly elevated risk for cancer  several years later in life. This risk is typically less than 0.5% percent. This risk is low in comparison to the normal incidence of human cancer, which is 33% for women and 50% for men according to the [employer] Society. - Radiation induced injury can include skin redness, resembling a rash, tissue breakdown / ulcers and hair loss (which can be temporary or permanent).   The likelihood of either of these occurring depends on the difficulty of the procedure and whether you are sensitive to radiation due to previous procedures, disease, or genetic conditions.   IF your procedure requires a prolonged use of radiation, you will be notified and given written instructions for further action.  It is your responsibility to monitor the irradiated area for the 2 weeks following the procedure and to notify your physician if you are concerned that you have suffered a radiation induced injury.    All of the patient's questions were answered, patient is agreeable to proceed.  Consent signed and in chart.   Thank you for this interesting consult.  I greatly enjoyed meeting Daniel Burton and look forward to participating in their care.  A copy of this report was sent to the requesting provider on this date.  Electronically Signed: Hoyt Koch, PA 10/16/2022, 8:06 AM   I spent a total of  30 Minutes   in face  to face in clinical consultation, greater than 50% of which was counseling/coordinating care for sigmoid sinus stenosis.

## 2022-10-15 NOTE — H&P (Deleted)
The note originally documented on this encounter has been moved the the encounter in which it belongs.

## 2022-10-16 ENCOUNTER — Ambulatory Visit (HOSPITAL_COMMUNITY)
Admission: RE | Admit: 2022-10-16 | Discharge: 2022-10-16 | Disposition: A | Discharge status: Home / Self Care | Payer: BC Managed Care – PPO | Source: Ambulatory Visit | Attending: Interventional Radiology | Admitting: Interventional Radiology

## 2022-10-16 ENCOUNTER — Ambulatory Visit (HOSPITAL_COMMUNITY): Payer: BC Managed Care – PPO | Admitting: Certified Registered"

## 2022-10-16 ENCOUNTER — Encounter (HOSPITAL_COMMUNITY)
Admission: RE | Disposition: A | Discharge status: Home / Self Care | Payer: Self-pay | Source: Home / Self Care | Attending: Interventional Radiology

## 2022-10-16 ENCOUNTER — Encounter (HOSPITAL_COMMUNITY): Payer: Self-pay

## 2022-10-16 ENCOUNTER — Ambulatory Visit (HOSPITAL_COMMUNITY)
Admission: RE | Admit: 2022-10-16 | Discharge: 2022-10-16 | Disposition: A | Discharge status: Home / Self Care | Payer: BC Managed Care – PPO | Attending: Interventional Radiology | Admitting: Interventional Radiology

## 2022-10-16 DIAGNOSIS — Z7982 Long term (current) use of aspirin: Secondary | ICD-10-CM | POA: Insufficient documentation

## 2022-10-16 DIAGNOSIS — T82858A Stenosis of vascular prosthetic devices, implants and grafts, initial encounter: Secondary | ICD-10-CM | POA: Diagnosis not present

## 2022-10-16 DIAGNOSIS — Z7984 Long term (current) use of oral hypoglycemic drugs: Secondary | ICD-10-CM | POA: Insufficient documentation

## 2022-10-16 DIAGNOSIS — Z7902 Long term (current) use of antithrombotics/antiplatelets: Secondary | ICD-10-CM | POA: Insufficient documentation

## 2022-10-16 DIAGNOSIS — T82856A Stenosis of peripheral vascular stent, initial encounter: Secondary | ICD-10-CM | POA: Diagnosis not present

## 2022-10-16 DIAGNOSIS — H93A9 Pulsatile tinnitus, unspecified ear: Secondary | ICD-10-CM | POA: Insufficient documentation

## 2022-10-16 DIAGNOSIS — Y832 Surgical operation with anastomosis, bypass or graft as the cause of abnormal reaction of the patient, or of later complication, without mention of misadventure at the time of the procedure: Secondary | ICD-10-CM | POA: Diagnosis not present

## 2022-10-16 DIAGNOSIS — E119 Type 2 diabetes mellitus without complications: Secondary | ICD-10-CM | POA: Insufficient documentation

## 2022-10-16 DIAGNOSIS — I771 Stricture of artery: Secondary | ICD-10-CM | POA: Insufficient documentation

## 2022-10-16 DIAGNOSIS — H93A1 Pulsatile tinnitus, right ear: Secondary | ICD-10-CM

## 2022-10-16 HISTORY — PX: IR ANGIO INTRA EXTRACRAN SEL COM CAROTID INNOMINATE BILAT MOD SED: IMG5360

## 2022-10-16 HISTORY — PX: IR ANGIO VERTEBRAL SEL VERTEBRAL UNI R MOD SED: IMG5368

## 2022-10-16 HISTORY — PX: RADIOLOGY WITH ANESTHESIA: SHX6223

## 2022-10-16 HISTORY — PX: IR US GUIDE VASC ACCESS RIGHT: IMG2390

## 2022-10-16 LAB — BASIC METABOLIC PANEL WITH GFR
Anion gap: 11 (ref 5–15)
BUN: 17 mg/dL (ref 8–23)
CO2: 24 mmol/L (ref 22–32)
Calcium: 8.8 mg/dL — ABNORMAL LOW (ref 8.9–10.3)
Chloride: 101 mmol/L (ref 98–111)
Creatinine, Ser: 1.07 mg/dL (ref 0.61–1.24)
GFR, Estimated: >60 mL/min
Glucose, Bld: 276 mg/dL — ABNORMAL HIGH (ref 70–99)
Potassium: 4.2 mmol/L (ref 3.5–5.1)
Sodium: 136 mmol/L (ref 135–145)

## 2022-10-16 LAB — CBC
HCT: 39.5 % (ref 39.0–52.0)
Hemoglobin: 12.6 g/dL — ABNORMAL LOW (ref 13.0–17.0)
MCH: 26.5 pg (ref 26.0–34.0)
MCHC: 31.9 g/dL (ref 30.0–36.0)
MCV: 83.2 fL (ref 80.0–100.0)
Platelets: 196 10*3/uL (ref 150–400)
RBC: 4.75 MIL/uL (ref 4.22–5.81)
RDW: 14.4 % (ref 11.5–15.5)
WBC: 5.2 10*3/uL (ref 4.0–10.5)
nRBC: 0 % (ref 0.0–0.2)

## 2022-10-16 LAB — GLUCOSE, CAPILLARY
Glucose-Capillary: 233 mg/dL — ABNORMAL HIGH (ref 70–99)
Glucose-Capillary: 227 mg/dL — ABNORMAL HIGH (ref 70–99)
Glucose-Capillary: 164 mg/dL — ABNORMAL HIGH (ref 70–99)

## 2022-10-16 LAB — PROTIME-INR
INR: 0.9 (ref 0.8–1.2)
Prothrombin Time: 12.5 seconds (ref 11.4–15.2)

## 2022-10-16 SURGERY — IR WITH ANESTHESIA
Anesthesia: Monitor Anesthesia Care

## 2022-10-16 MED ORDER — NITROGLYCERIN 1 MG/10 ML FOR IR/CATH LAB: INTRA_ARTERIAL | Status: AC | PRN

## 2022-10-16 MED ORDER — HEPARIN SODIUM (PORCINE) 1000 UNIT/ML IJ SOLN
INTRAMUSCULAR | Status: AC
  Filled 2022-10-16: qty 10

## 2022-10-16 MED ORDER — FENTANYL CITRATE (PF) 250 MCG/5ML IJ SOLN
INTRAMUSCULAR | Status: AC
  Filled 2022-10-16: qty 5

## 2022-10-16 MED ORDER — INSULIN ASPART 100 UNIT/ML IJ SOLN
INTRAMUSCULAR | Status: AC
  Filled 2022-10-16: qty 1

## 2022-10-16 MED ORDER — CEFAZOLIN SODIUM-DEXTROSE 2-4 GM/100ML-% IV SOLN
2.0000 g | INTRAVENOUS | Status: DC
  Filled 2022-10-16: qty 100

## 2022-10-16 MED ORDER — INSULIN ASPART 100 UNIT/ML IJ SOLN
INTRAMUSCULAR | Status: AC
  Administered 2022-10-16: 6 [IU] via SUBCUTANEOUS
  Filled 2022-10-16: qty 1

## 2022-10-16 MED ORDER — MIDAZOLAM HCL 2 MG/2ML IJ SOLN
INTRAMUSCULAR | Status: AC
  Filled 2022-10-16: qty 2

## 2022-10-16 MED ORDER — VERAPAMIL HCL 2.5 MG/ML IV SOLN
INTRAVENOUS | Status: AC
  Filled 2022-10-16: qty 2

## 2022-10-16 MED ORDER — LACTATED RINGERS IV SOLN: INTRAVENOUS | Status: DC

## 2022-10-16 MED ORDER — CHLORHEXIDINE GLUCONATE 0.12 % MT SOLN: 15.0000 mL | Freq: Once | OROMUCOSAL | Status: AC

## 2022-10-16 MED ORDER — NITROGLYCERIN 1 MG/10 ML FOR IR/CATH LAB
INTRA_ARTERIAL | Status: AC
  Filled 2022-10-16: qty 10

## 2022-10-16 MED ORDER — CLOPIDOGREL BISULFATE 75 MG PO TABS
75.0000 mg | ORAL_TABLET | Freq: Once | ORAL | Status: DC
  Filled 2022-10-16: qty 1

## 2022-10-16 MED ORDER — CHLORHEXIDINE GLUCONATE 0.12 % MT SOLN
OROMUCOSAL | Status: AC
  Administered 2022-10-16: 15 mL via OROMUCOSAL
  Filled 2022-10-16: qty 15

## 2022-10-16 MED ORDER — LIDOCAINE HCL 1 % IJ SOLN
INTRAMUSCULAR | Status: AC
  Filled 2022-10-16: qty 20

## 2022-10-16 MED ORDER — MIDAZOLAM HCL 2 MG/2ML IJ SOLN
INTRAMUSCULAR | Status: DC | PRN
  Administered 2022-10-16: 2 mg via INTRAVENOUS

## 2022-10-16 MED ORDER — ORAL CARE MOUTH RINSE: 15.0000 mL | Freq: Once | OROMUCOSAL | Status: AC

## 2022-10-16 MED ORDER — SODIUM CHLORIDE 0.9 % IV SOLN: INTRAVENOUS | Status: DC

## 2022-10-16 MED ORDER — SODIUM CHLORIDE 0.9 % IV SOLN: INTRAVENOUS | Status: AC

## 2022-10-16 MED ORDER — INSULIN ASPART 100 UNIT/ML IJ SOLN
0.0000 [IU] | INTRAMUSCULAR | Status: AC | PRN
  Administered 2022-10-16: 6 [IU] via SUBCUTANEOUS

## 2022-10-16 MED ORDER — IOHEXOL 300 MG/ML  SOLN
150.0000 mL | Freq: Once | INTRAMUSCULAR | Status: AC | PRN
  Administered 2022-10-16: 80 mL via INTRA_ARTERIAL

## 2022-10-16 MED ORDER — FENTANYL CITRATE (PF) 250 MCG/5ML IJ SOLN
INTRAMUSCULAR | Status: DC | PRN
  Administered 2022-10-16: 50 ug via INTRAVENOUS

## 2022-10-16 NOTE — Anesthesia Postprocedure Evaluation (Signed)
Anesthesia Post Note  Patient: KAHLEN MORAIS  Procedure(s) Performed: Diagnostic catheter arteriogram with intent to treat with either angioplasty, or placement of a telescoping stent at the site of the previous right sigmoid sinus stent     Patient location during evaluation: PACU Anesthesia Type: MAC Level of consciousness: awake and alert Pain management: pain level controlled Vital Signs Assessment: post-procedure vital signs reviewed and stable Respiratory status: spontaneous breathing, nonlabored ventilation and respiratory function stable Cardiovascular status: stable and blood pressure returned to baseline Postop Assessment: no apparent nausea or vomiting Anesthetic complications: no   No notable events documented.  Last Vitals:  Vitals:   10/16/22 1345 10/16/22 1400  BP: 97/62 104/68  Pulse: (!) 53 (!) 58  Resp: 13 15  Temp:  36.5 C  SpO2: 99% 99%    Last Pain:  Vitals:   10/16/22 1400  PainSc: 0-No pain                 Tobiah Celestine

## 2022-10-16 NOTE — Procedures (Signed)
INR.  Bilateral common carotid artery and right vertebral artery angiograms.  Right radial approach.  Findings.  1.  Severe stenosis of the right sigmoid sinus within the distal portion of the stented segment.  Fatima Sanger MD.

## 2022-10-16 NOTE — Anesthesia Preprocedure Evaluation (Addendum)
Anesthesia Evaluation  Patient identified by MRN, date of birth, ID band Patient awake    Reviewed: Allergy & Precautions, NPO status , Patient's Chart, lab work & pertinent test results  History of Anesthesia Complications Negative for: history of anesthetic complications  Airway Mallampati: III  TM Distance: >3 FB Neck ROM: Limited    Dental  (+) Dental Advisory Given, Teeth Intact   Pulmonary neg shortness of breath, neg sleep apnea, neg COPD, neg recent URI, former smoker   breath sounds clear to auscultation       Cardiovascular negative cardio ROS  Rhythm:Regular     Neuro/Psych right-sided sigmoid sinus stenosis with pulsatile tinnitus known to IR from initial treatment 10/18/2020 at which time his large right jugular bulb was embolized and stented.  negative psych ROS   GI/Hepatic negative GI ROS, Neg liver ROS,,,  Endo/Other  diabetes, Type 2  Lab Results      Component                Value               Date                      HGBA1C                   7.1 (H)             07/12/2022             Renal/GU Lab Results      Component                Value               Date                      CREATININE               1.07                10/16/2022                Musculoskeletal negative musculoskeletal ROS (+)    Abdominal   Peds  Hematology Lab Results      Component                Value               Date                      WBC                      5.2                 10/16/2022                HGB                      12.6 (L)            10/16/2022                HCT                      39.5                10/16/2022  MCV                      83.2                10/16/2022                PLT                      196                 10/16/2022            Plavix    Anesthesia Other Findings   Reproductive/Obstetrics                             Anesthesia  Physical Anesthesia Plan  ASA: 2  Anesthesia Plan: MAC   Post-op Pain Management: Minimal or no pain anticipated   Induction: Intravenous  PONV Risk Score and Plan: 1 and Treatment may vary due to age or medical condition  Airway Management Planned: Nasal Cannula, Natural Airway and Simple Face Mask  Additional Equipment: None  Intra-op Plan:   Post-operative Plan:   Informed Consent: I have reviewed the patients History and Physical, chart, labs and discussed the procedure including the risks, benefits and alternatives for the proposed anesthesia with the patient or authorized representative who has indicated his/her understanding and acceptance.     Dental advisory given  Plan Discussed with: CRNA  Anesthesia Plan Comments:         Anesthesia Quick Evaluation

## 2022-10-16 NOTE — Transfer of Care (Signed)
Immediate Anesthesia Transfer of Care Note  Patient: Daniel Burton  Procedure(s) Performed: Diagnostic catheter arteriogram with intent to treat with either angioplasty, or placement of a telescoping stent at the site of the previous right sigmoid sinus stent  Patient Location: PACU  Anesthesia Type:MAC  Level of Consciousness: awake, alert , oriented, and patient cooperative  Airway & Oxygen Therapy: Patient Spontanous Breathing  Post-op Assessment: Report given to RN and Post -op Vital signs reviewed and stable  Post vital signs: Reviewed and stable  Last Vitals:  Vitals Value Taken Time  BP 122/79 10/16/22 1000  Temp    Pulse 60 10/16/22 1003  Resp 13 10/16/22 1003  SpO2 97 % 10/16/22 1003  Vitals shown include unfiled device data.  Last Pain:  Vitals:   10/16/22 0621  PainSc: 0-No pain         Complications: No notable events documented.

## 2022-10-16 NOTE — Sedation Documentation (Signed)
Hand off with PACU, RN given.  Patient's right radial TR band has 12 cc air, site is level 0.

## 2022-10-16 NOTE — Discharge Summary (Signed)
    Patient ID: Daniel Burton MRN: [id number] DOB/AGE: [birth year] 62 y.o.  Admit date: 10/16/2022 Discharge date: 10/16/2022  Supervising Physician: Julieanne Cotton  Patient Status: [facility] - In-pt  Admission Diagnoses: Right sigmoid sinus stenosis  Discharge Diagnoses:  Right sigmoid sinus stenosis   Discharged Condition: good  Hospital Course:   Daniel Burton is a 62 y.o. male with past medical history of DM and right-sided sigmoid sinus stenosis with pulsatile tinnitus known to IR from initial treatment 10/18/2020 returned today for retreatment of his right sigmoid sinus stenosis due to recurrent symptoms with consistent imaging findings.  After discussion with Dr. Corliss Skains pre-procedure it was determined that the patient's symptoms were mild in nature.  Decision made to proceed with angiogram only to determine extent of stenosis, if any.  Findings consistent with severe right sigmoid sinus stenosis, however again given only the mild nature of his symptoms, no intervention was performed.  Patient to recover in PACU prior to discharge home today. Discharge plan in place.  He is stable for discharge after expected recovery period.     Discharge Exam: Blood pressure 109/76, pulse (!) 55, temperature 97.7 F (36.5 C), resp. rate 10, height 6' (1.829 m), weight 168 lb (76.2 kg), SpO2 98%. General appearance: alert, cooperative, and no distress Resp: clear to auscultation bilaterally Cardio: regular rate and rhythm, S1, S2 normal, no murmur, click, rub or gallop and regular rate and rhythm GI: soft, non-tender; bowel sounds normal; no masses,  no organomegaly Skin: Skin color, texture, turgor normal. No rashes or lesions Incision/Wound: TR band in place.  No oozing.   Disposition: Discharge disposition: 01-Home or Self Care        Allergies as of 10/16/2022   No Known Allergies      Medication List     TAKE these medications    aspirin EC 81 MG tablet Take 1  tablet (81 mg total) by mouth daily. Swallow whole.   clopidogrel 75 MG tablet Commonly known as: Plavix Start 10 days prior to your procedure. Take 300 mg (4 tablets) by mouth for the first dose, then take 75 mg (1 tablet) by mouth daily.   metFORMIN 500 MG tablet Commonly known as: GLUCOPHAGE Take 1 tablet (500 mg total) by mouth daily with breakfast.   rosuvastatin 20 MG tablet Commonly known as: Crestor One each evening        Follow-up Information     Julieanne Cotton, MD Follow up.   Specialties: Interventional Radiology, Radiology Why: Schedulers will call to arrange Contact information: [address] [place] [address] [place] [address] [phone]                  Electronically Signed: Hoyt Koch, PA 10/16/2022, 12:21 PM   I have spent Greater Than 30 Minutes discharging Daniel Burton.

## 2022-10-16 NOTE — Sedation Documentation (Addendum)
12 cc of air applied to right radial TR band at [phone]

## 2022-10-17 ENCOUNTER — Encounter (HOSPITAL_COMMUNITY): Payer: Self-pay | Admitting: Interventional Radiology

## 2022-10-18 ENCOUNTER — Other Ambulatory Visit: Payer: Self-pay

## 2022-10-18 DIAGNOSIS — H9311 Tinnitus, right ear: Secondary | ICD-10-CM

## 2022-11-02 ENCOUNTER — Ambulatory Visit: Payer: BC Managed Care – PPO | Admitting: Family Medicine

## 2022-11-02 VITALS — BP 97/65 | HR 61 | Temp 97.3°F | Ht 72.0 in | Wt 164.2 lb

## 2022-11-02 DIAGNOSIS — M25512 Pain in left shoulder: Secondary | ICD-10-CM | POA: Diagnosis not present

## 2022-11-02 NOTE — Patient Instructions (Signed)
Tylenol 1000 mg 3 times daily as needed.  Take care  Dr. Adriana Simas

## 2022-11-02 NOTE — Assessment & Plan Note (Signed)
Acute. Normal exam. Advised against NSAID's at this time given use of Aspirin and Plavix. Supportive care and Tylenol as needed.

## 2022-11-02 NOTE — Progress Notes (Signed)
Subjective:  Patient ID: Daniel Burton, male    DOB: 17-Apr-1960  Age: 61 y.o. MRN: [id number]  CC: Chief Complaint  Patient presents with   Shoulder Pain    PT comes in today with complaints of his left shoulder being stiff for the past 5 days.    HPI:  62 year old male presents for evaluation of the above.  Reports 5 day history of left shoulder discomfort. Feels "like arthritis". Normal ROM. No recent fall, trauma or injury. No relieving factors.   Patient Active Problem List   Diagnosis Date Noted   Left shoulder pain 11/02/2022   Type 2 diabetes mellitus without complication, without long-term current use of insulin (HCC) 07/13/2022   Tubular adenoma 02/21/2021   Pulsatile tinnitus of right ear 10/18/2020   HLD (hyperlipidemia) 12/09/2019   Abnormal liver enzymes 11/25/2019   Screening for deficiency anemia 11/25/2019   Chronic back pain 10/29/2012    Social Hx   Social History   Socioeconomic History   Marital status: Married    Spouse name: Not on file   Number of children: 2   Years of education: 13   Highest education level: Not on file  Occupational History   Not on file  Tobacco Use   Smoking status: Former    Types: Cigarettes   Smokeless tobacco: Former  Building services engineer status: Never Used  Substance and Sexual Activity   Alcohol use: No   Drug use: Not Currently   Sexual activity: Yes  Other Topics Concern   Not on file  Social History Narrative   Lives at home with wife   Right handed   Caffeine: tea occasionally    Social Determinants of Health   Financial Resource Strain: Not on file  Food Insecurity: Not on file  Transportation Needs: Not on file  Physical Activity: Not on file  Stress: Not on file  Social Connections: Not on file    Review of Systems Per HPI  Objective:  BP 97/65   Pulse 61   Temp (!) 97.3 F (36.3 C)   Ht 6' (1.829 m)   Wt 164 lb 3.2 oz (74.5 kg)   SpO2 98%   BMI 22.27 kg/m      11/02/2022     1:05 PM 10/16/2022    2:00 PM 10/16/2022    1:45 PM  BP/Weight  Systolic BP 97 104 97  Diastolic BP 65 68 62  Wt. (Lbs) 164.2    BMI 22.27 kg/m2      Physical Exam Vitals and nursing note reviewed.  Constitutional:      General: He is not in acute distress.    Appearance: Normal appearance.  HENT:     Head: Normocephalic and atraumatic.  Cardiovascular:     Rate and Rhythm: Normal rate and regular rhythm.  Pulmonary:     Effort: Pulmonary effort is normal.     Breath sounds: Normal breath sounds.  Musculoskeletal:     Comments: Shoulder: Left Inspection reveals no abnormalities, atrophy or asymmetry. Palpation is normal with no tenderness over AC joint or bicipital groove. ROM is full in all planes. Rotator cuff strength normal throughout. No signs of impingement with negative Hawkin's tests, empty can.  Neurological:     Mental Status: He is alert.     Lab Results  Component Value Date   WBC 5.2 10/16/2022   HGB 12.6 (L) 10/16/2022   HCT 39.5 10/16/2022   PLT 196 10/16/2022  GLUCOSE 276 (H) 10/16/2022   CHOL 290 (H) 07/12/2022   TRIG 64 07/12/2022   HDL 53 07/12/2022   LDLCALC 228 (H) 07/12/2022   ALT 24 07/12/2022   AST 22 07/12/2022   NA 136 10/16/2022   K 4.2 10/16/2022   CL 101 10/16/2022   CREATININE 1.07 10/16/2022   BUN 17 10/16/2022   CO2 24 10/16/2022   TSH 1.640 08/21/2017   INR 0.9 10/16/2022   HGBA1C 7.1 (H) 07/12/2022     Assessment & Plan:   Problem List Items Addressed This Visit       Other   Left shoulder pain - Primary    Acute. Normal exam. Advised against NSAID's at this time given use of Aspirin and Plavix. Supportive care and Tylenol as needed.        Follow-up:  Return if symptoms worsen or fail to improve.  Everlene Other DO [facility]

## 2022-11-14 ENCOUNTER — Ambulatory Visit: Payer: BC Managed Care – PPO | Admitting: Family Medicine

## 2022-11-27 ENCOUNTER — Ambulatory Visit
Admission: RE | Admit: 2022-11-27 | Discharge: 2022-11-27 | Disposition: A | Discharge status: Home / Self Care | Payer: BC Managed Care – PPO | Source: Ambulatory Visit | Attending: Family Medicine | Admitting: Family Medicine

## 2022-11-27 VITALS — BP 120/76 | HR 79 | Temp 97.2°F | Resp 20

## 2022-11-27 DIAGNOSIS — H538 Other visual disturbances: Secondary | ICD-10-CM | POA: Diagnosis not present

## 2022-11-27 DIAGNOSIS — R739 Hyperglycemia, unspecified: Secondary | ICD-10-CM | POA: Diagnosis not present

## 2022-11-27 DIAGNOSIS — E1165 Type 2 diabetes mellitus with hyperglycemia: Secondary | ICD-10-CM

## 2022-11-27 LAB — POCT FASTING CBG KUC MANUAL ENTRY: POCT Glucose (KUC): 294 mg/dL — AB (ref 70–99)

## 2022-11-27 MED ORDER — METFORMIN HCL ER 500 MG PO TB24: 1000.0000 mg | ORAL_TABLET | Freq: Every day | ORAL | 0 refills | Status: DC

## 2022-11-27 NOTE — ED Triage Notes (Signed)
Pt reports he is having blurry vision when looking far away x 1 week

## 2022-11-27 NOTE — Discharge Instructions (Addendum)
I have sent over the extended release version of the metformin to see if this helps reduce your side effects and allows you to tolerate the 1000 mg a day dose.  Follow-up with your primary care provider and ophthalmologist tomorrow.  I suspect your blurry vision is related to your significantly increased blood sugar readings

## 2022-11-27 NOTE — ED Provider Notes (Addendum)
[facility]    CSN: [id number] Arrival date & time: 11/27/22  1847      History   Chief Complaint Chief Complaint  Patient presents with   Blurred Vision    HPI Daniel Burton is a 62 y.o. male.   Patient presenting today with about a week of blurred vision when looking long distances.  Denies eye redness, eye pain, discharge, headaches, injury to the eyes.  So far not trying to thing over-the-counter for symptoms.  Of note, does have uncontrolled diabetes.  Does not check his blood sugars at home but blood sugars were in the mid 200s when last checked at the hospital.  He is supposed to be taking metformin 500 mg twice daily but only takes it once daily due to diarrhea side effects.  Is due for labs at his primary care office tomorrow but does not have an office visit scheduled until October.  Notes he is off track on his diet and exercise plan, just had a frosty before arriving here today and has been eating things he knows he should not recently.    Past Medical History:  Diagnosis Date   Back pain with history of spinal surgery    Diabetes mellitus without complication (HCC)    type 2, no med, does not check blood sugar   High cholesterol    Tinnitus of right ear 10/18/2020   jugular bulb   Tubular adenoma 02/21/2021   Tubular adenoma, sessile serrated adenoma, colonoscopy, [facility],Dr.Brahmbhatt, recommended to have repeat colonoscopy May 2023    Patient Active Problem List   Diagnosis Date Noted   Left shoulder pain 11/02/2022   Type 2 diabetes mellitus without complication, without long-term current use of insulin (HCC) 07/13/2022   Tubular adenoma 02/21/2021   Pulsatile tinnitus of right ear 10/18/2020   HLD (hyperlipidemia) 12/09/2019   Abnormal liver enzymes 11/25/2019   Screening for deficiency anemia 11/25/2019   Chronic back pain 10/29/2012    Past Surgical History:  Procedure Laterality Date   CERVICAL SPINE SURGERY  2021   ELBOW SURGERY  Right 1979   HERNIA REPAIR     IR ANGIO INTRA EXTRACRAN SEL COM CAROTID INNOMINATE BILAT MOD SED  06/25/2020   IR ANGIO INTRA EXTRACRAN SEL COM CAROTID INNOMINATE BILAT MOD SED  10/16/2022   IR ANGIO INTRA EXTRACRAN SEL COM CAROTID INNOMINATE UNI R MOD SED  10/18/2020   IR ANGIO VERTEBRAL SEL SUBCLAVIAN INNOMINATE BILAT MOD SED  06/25/2020   IR ANGIO VERTEBRAL SEL VERTEBRAL UNI R MOD SED  10/16/2022   IR CT HEAD LTD  10/18/2020   IR RADIOLOGIST EVAL & MGMT  07/13/2020   IR RADIOLOGIST EVAL & MGMT  09/10/2020   IR RADIOLOGIST EVAL & MGMT  11/03/2020   IR RADIOLOGIST EVAL & MGMT  12/03/2020   IR RADIOLOGIST EVAL & MGMT  02/11/2021   IR RADIOLOGIST EVAL & MGMT  07/15/2021   IR RADIOLOGIST EVAL & MGMT  09/28/2021   IR RADIOLOGIST EVAL & MGMT  01/18/2022   IR RADIOLOGIST EVAL & MGMT  06/15/2022   IR TRANSCATH PLC STENT  INITIAL VEIN  INC ANGIOPLASTY  10/18/2020   IR US GUIDE VASC ACCESS RIGHT  10/18/2020   IR US GUIDE VASC ACCESS RIGHT  10/16/2022   IR VENO/JUGULAR RIGHT  10/18/2020   RADIOLOGY WITH ANESTHESIA N/A 10/18/2020   Procedure: Lessie Dings;  Surgeon: Julieanne Cotton, MD;  Location: [facility] OR;  Service: Radiology;  Laterality: N/A;   RADIOLOGY WITH ANESTHESIA  N/A 10/16/2022   Procedure: Diagnostic catheter arteriogram with intent to treat with either angioplasty, or placement of a telescoping stent at the site of the previous right sigmoid sinus stent;  Surgeon: Julieanne Cotton, MD;  Location: [facility] OR;  Service: Radiology;  Laterality: N/A;       Home Medications    Prior to Admission medications   Medication Sig Start Date End Date Taking? Authorizing Provider  metFORMIN (GLUCOPHAGE-XR) 500 MG 24 hr tablet Take 2 tablets (1,000 mg total) by mouth daily with breakfast. 11/27/22  Yes Particia Nearing, PA-C  aspirin EC 81 MG tablet Take 1 tablet (81 mg total) by mouth daily. Swallow whole. 08/17/22   Lynnette Caffey A, PA-C  clopidogrel (PLAVIX) 75 MG tablet Start 10 days prior to your  procedure. Take 300 mg (4 tablets) by mouth for the first dose, then take 75 mg (1 tablet) by mouth daily. 08/17/22   Lynnette Caffey A, PA-C  metFORMIN (GLUCOPHAGE) 500 MG tablet Take 1 tablet (500 mg total) by mouth daily with breakfast. 07/04/22   Babs Sciara, MD  rosuvastatin (CRESTOR) 20 MG tablet One each evening Patient not taking: Reported on 10/10/2022 02/24/22   Babs Sciara, MD    Family History Family History  Problem Relation Age of Onset   Diabetes Mother    Heart attack Mother    Throat cancer Father    Liver cancer Sister    Thyroid disease Sister    Diabetes Brother    Allergic rhinitis Neg Hx    Angioedema Neg Hx    Asthma Neg Hx    Urticaria Neg Hx     Social History Social History   Tobacco Use   Smoking status: Former    Types: Cigarettes   Smokeless tobacco: Former  Building services engineer status: Never Used  Substance Use Topics   Alcohol use: No   Drug use: Not Currently     Allergies   Patient has no known allergies.   Review of Systems Review of Systems Per HPI  Physical Exam Triage Vital Signs ED Triage Vitals  Encounter Vitals Group     BP 11/27/22 1905 120/76     Systolic BP Percentile --      Diastolic BP Percentile --      Pulse Rate 11/27/22 1905 79     Resp 11/27/22 1905 20     Temp 11/27/22 1905 (!) 97.2 F (36.2 C)     Temp Source 11/27/22 1905 Oral     SpO2 11/27/22 1905 94 %     Weight --      Height --      Head Circumference --      Peak Flow --      Pain Score 11/27/22 1906 0     Pain Loc --      Pain Education --      Exclude from Growth Chart --    No data found.  Updated Vital Signs BP 120/76 (BP Location: Right Arm)   Pulse 79   Temp (!) 97.2 F (36.2 C) (Oral)   Resp 20   SpO2 94%   Visual Acuity Right Eye Distance: 20/25 Left Eye Distance: 20/30 Bilateral Distance: 20/20  Right Eye Near:   Left Eye Near:    Bilateral Near:     Physical Exam Vitals and nursing note reviewed.   Constitutional:      Appearance: Normal appearance.  HENT:     Head: Atraumatic.  Mouth/Throat:     Mouth: Mucous membranes are moist.  Eyes:     Extraocular Movements: Extraocular movements intact.     Conjunctiva/sclera: Conjunctivae normal.     Pupils: Pupils are equal, round, and reactive to light.  Cardiovascular:     Rate and Rhythm: Normal rate and regular rhythm.  Pulmonary:     Effort: Pulmonary effort is normal.     Breath sounds: Normal breath sounds.  Musculoskeletal:        General: Normal range of motion.     Cervical back: Normal range of motion and neck supple.  Skin:    General: Skin is warm and dry.  Neurological:     General: No focal deficit present.     Mental Status: He is oriented to person, place, and time.     Motor: No weakness.     Gait: Gait normal.  Psychiatric:        Mood and Affect: Mood normal.        Thought Content: Thought content normal.        Judgment: Judgment normal.      UC Treatments / Results  Labs (all labs ordered are listed, but only abnormal results are displayed) Labs Reviewed  POCT FASTING CBG [facility] MANUAL ENTRY - Abnormal; Notable for the following components:      Result Value   POCT Glucose ([facility]) 294 (*)    All other components within normal limits    EKG   Radiology No results found.  Procedures Procedures (including critical care time)  Medications Ordered in UC Medications - No data to display  Initial Impression / Assessment and Plan / UC Course  I have reviewed the triage vital signs and the nursing notes.  Pertinent labs & imaging results that were available during my care of the patient were reviewed by me and considered in my medical decision making (see chart for details).     Blood sugar 294 today in triage.  Vision benign today, exam also benign.  Suspect related to hyperglycemia.  Will switch his metformin to extended release to help with side effects and slightly increase the dose but  did discuss that likely more changes would need to be made through primary care follow-up.  He plans to set up an appointment tomorrow as well as follow-up with his ophthalmologist which he is due to see anyways.  Return precautions reviewed. Final Clinical Impressions(s) / UC Diagnoses   Final diagnoses:  Hyperglycemia  Blurred vision  Uncontrolled type 2 diabetes mellitus with hyperglycemia (HCC)     Discharge Instructions      I have sent over the extended release version of the metformin to see if this helps reduce your side effects and allows you to tolerate the 1000 mg a day dose.  Follow-up with your primary care provider and ophthalmologist tomorrow.  I suspect your blurry vision is related to your significantly increased blood sugar readings     ED Prescriptions     Medication Sig Dispense Auth. Provider   metFORMIN (GLUCOPHAGE-XR) 500 MG 24 hr tablet Take 2 tablets (1,000 mg total) by mouth daily with breakfast. 60 tablet Particia Nearing, [place]      PDMP not reviewed this encounter.   Particia Nearing, [place] 11/27/22 1944    Particia Nearing, PA-C 11/27/22 1944    Particia Nearing, [place] 11/27/22 1945

## 2022-11-27 NOTE — ED Notes (Signed)
Pt states he does not check his blood sugar.    Pcp did mention he needs to get one. Pt non-compliant.

## 2022-11-28 ENCOUNTER — Other Ambulatory Visit: Payer: Self-pay

## 2022-11-28 ENCOUNTER — Telehealth: Payer: Self-pay

## 2022-11-28 DIAGNOSIS — E785 Hyperlipidemia, unspecified: Secondary | ICD-10-CM | POA: Diagnosis not present

## 2022-11-28 DIAGNOSIS — E1169 Type 2 diabetes mellitus with other specified complication: Secondary | ICD-10-CM | POA: Diagnosis not present

## 2022-11-28 DIAGNOSIS — Z79899 Other long term (current) drug therapy: Secondary | ICD-10-CM | POA: Diagnosis not present

## 2022-11-28 DIAGNOSIS — E119 Type 2 diabetes mellitus without complications: Secondary | ICD-10-CM | POA: Diagnosis not present

## 2022-11-28 MED ORDER — LANCET DEVICE MISC: 1.0000 | Freq: Two times a day (BID) | 1 refills | Status: AC

## 2022-11-28 MED ORDER — BLOOD GLUCOSE TEST VI STRP: 1.0000 | ORAL_STRIP | Freq: Two times a day (BID) | 1 refills | Status: DC

## 2022-11-28 MED ORDER — BLOOD GLUCOSE MONITORING SUPPL DEVI: 1.0000 | Freq: Two times a day (BID) | 1 refills | Status: DC

## 2022-11-28 MED ORDER — LANCETS MISC. MISC: 1.0000 | Freq: Two times a day (BID) | 1 refills | Status: AC

## 2022-11-28 NOTE — Telephone Encounter (Signed)
Patient has been informed per drs notes and recommendations. Orders placed

## 2022-11-28 NOTE — Telephone Encounter (Signed)
Pt needs full diabetic sugar testing kit due to the increase of blood readings. Urgent care increased his Metformin to 1000 mg. Pt will see Dr Lorin Picket next Friday the 13th for a follow up.  Daniel Burton [phone]  [facility]

## 2022-12-06 ENCOUNTER — Telehealth (HOSPITAL_COMMUNITY): Payer: Self-pay | Admitting: Student

## 2022-12-06 NOTE — Telephone Encounter (Signed)
Patient called IR today stating he is out of plavix and his pharmacy will not refill the prescription for 5 more days. Per Dr. Corliss Skains, as long as the patient takes his 81 mg aspirin daily he will be ok. He is to resume plavix when he gets his prescription re-filled.   Patient verbalized understanding.  Alwyn Ren, [place] [phone] 12/06/2022, 4:01 PM

## 2022-12-08 ENCOUNTER — Ambulatory Visit: Payer: BC Managed Care – PPO | Admitting: Family Medicine

## 2022-12-08 ENCOUNTER — Encounter: Payer: Self-pay | Admitting: Family Medicine

## 2022-12-08 VITALS — BP 107/59 | HR 72 | Temp 98.0°F | Wt 163.6 lb

## 2022-12-08 DIAGNOSIS — Z79899 Other long term (current) drug therapy: Secondary | ICD-10-CM | POA: Diagnosis not present

## 2022-12-08 DIAGNOSIS — E119 Type 2 diabetes mellitus without complications: Secondary | ICD-10-CM

## 2022-12-08 DIAGNOSIS — E1169 Type 2 diabetes mellitus with other specified complication: Secondary | ICD-10-CM

## 2022-12-08 DIAGNOSIS — E785 Hyperlipidemia, unspecified: Secondary | ICD-10-CM | POA: Diagnosis not present

## 2022-12-08 MED ORDER — ROSUVASTATIN CALCIUM 40 MG PO TABS: ORAL_TABLET | ORAL | 1 refills | Status: DC

## 2022-12-08 MED ORDER — SILDENAFIL CITRATE 100 MG PO TABS: 50.0000 mg | ORAL_TABLET | Freq: Every day | ORAL | 11 refills | Status: DC | PRN

## 2022-12-08 MED ORDER — METFORMIN HCL ER 500 MG PO TB24: 1000.0000 mg | ORAL_TABLET | Freq: Every day | ORAL | 1 refills | Status: DC

## 2022-12-08 NOTE — Progress Notes (Unsigned)
Subjective:    Patient ID: Daniel Burton, male    DOB: 11-Mar-1961, 62 y.o.   MRN: [id number]  HPI Patient coming in for urgent care follow up due to having blurry vision. Sugar levels at urgent care was 294. Patient states he is having blurry vision vision at the moment.  We did discuss diabetes discussed the importance of sticking with diet activity and medications Importance of getting A1c down  Review of Systems     Objective:   Physical Exam  General-in no acute distress Eyes-no discharge Lungs-respiratory rate normal, CTA CV-no murmurs,RRR Extremities skin warm dry no edema Neuro grossly normal Behavior normal, alert       Assessment & Plan:  Diabetes subpar control Importance of taking medicine following diet Patient does not want to add additional medicine yet He will monitor sugars send [place] readings within 2 to 3 weeks if they are not in a good zone we will add glipizide Not a good candidate for GLP-1 could Patient does not want to be on insulin

## 2022-12-11 DIAGNOSIS — E119 Type 2 diabetes mellitus without complications: Secondary | ICD-10-CM | POA: Diagnosis not present

## 2022-12-11 DIAGNOSIS — H01002 Unspecified blepharitis right lower eyelid: Secondary | ICD-10-CM | POA: Diagnosis not present

## 2022-12-11 DIAGNOSIS — H01001 Unspecified blepharitis right upper eyelid: Secondary | ICD-10-CM | POA: Diagnosis not present

## 2022-12-11 DIAGNOSIS — H40013 Open angle with borderline findings, low risk, bilateral: Secondary | ICD-10-CM | POA: Diagnosis not present

## 2023-01-02 ENCOUNTER — Ambulatory Visit: Payer: BC Managed Care – PPO | Admitting: Family Medicine

## 2023-01-02 VITALS — BP 113/64 | HR 67 | Wt 162.6 lb

## 2023-01-02 DIAGNOSIS — E785 Hyperlipidemia, unspecified: Secondary | ICD-10-CM

## 2023-01-02 DIAGNOSIS — E1169 Type 2 diabetes mellitus with other specified complication: Secondary | ICD-10-CM

## 2023-01-02 DIAGNOSIS — E119 Type 2 diabetes mellitus without complications: Secondary | ICD-10-CM

## 2023-01-02 NOTE — Progress Notes (Signed)
   Subjective:    Patient ID: Daniel Burton, male    DOB: October 25, 1960, 62 y.o.   MRN: [id number]  HPI Patient for blood pressure check up.  The patient does have hypertension.   Patient relates dietary measures try to minimize salt The importance of healthy diet and activity were discussed Patient relates compliance  Patient here for follow-up regarding cholesterol.    Patient relates taking medication on a regular basis Denies problems with medication Importance of dietary measures discussed Regular lab work regarding lipid and liver was checked and if needing additional labs was appropriately ordered    Review of Systems     Objective:   Physical Exam  General-in no acute distress Eyes-no discharge Lungs-respiratory rate normal, CTA CV-no murmurs,RRR Extremities skin warm dry no edema Neuro grossly normal Behavior normal, alert       Assessment & Plan:   I reviewed over glucose readings that he did They are much improved He will maintain healthy diet maintain his medications do lab work before follow-up visit in approximately 3 months our goal is to get the A1c down to 7 if it does not get there we will need to add additional medicines

## 2023-02-08 DIAGNOSIS — R12 Heartburn: Secondary | ICD-10-CM | POA: Diagnosis not present

## 2023-03-06 DIAGNOSIS — R1310 Dysphagia, unspecified: Secondary | ICD-10-CM | POA: Insufficient documentation

## 2023-03-06 DIAGNOSIS — K5730 Diverticulosis of large intestine without perforation or abscess without bleeding: Secondary | ICD-10-CM | POA: Insufficient documentation

## 2023-03-08 ENCOUNTER — Other Ambulatory Visit (HOSPITAL_COMMUNITY): Payer: Self-pay | Admitting: Interventional Radiology

## 2023-03-08 DIAGNOSIS — I771 Stricture of artery: Secondary | ICD-10-CM

## 2023-03-08 DIAGNOSIS — H93A9 Pulsatile tinnitus, unspecified ear: Secondary | ICD-10-CM

## 2023-03-09 ENCOUNTER — Ambulatory Visit: Payer: BC Managed Care – PPO | Admitting: Orthopedic Surgery

## 2023-03-09 ENCOUNTER — Encounter: Payer: Self-pay | Admitting: Orthopedic Surgery

## 2023-03-09 ENCOUNTER — Ambulatory Visit: Payer: BC Managed Care – PPO | Admitting: Family Medicine

## 2023-03-09 VITALS — BP 123/86 | HR 65 | Ht 72.0 in | Wt 168.0 lb

## 2023-03-09 DIAGNOSIS — M5410 Radiculopathy, site unspecified: Secondary | ICD-10-CM

## 2023-03-09 DIAGNOSIS — M545 Low back pain, unspecified: Secondary | ICD-10-CM

## 2023-03-09 MED ORDER — MELOXICAM 7.5 MG PO TABS: 7.5000 mg | ORAL_TABLET | Freq: Every day | ORAL | 5 refills | Status: DC

## 2023-03-09 NOTE — Patient Instructions (Signed)
Physical therapy has been ordered for you at [facility]. They should call you to schedule, [phone] is the phone number to call, if you want to call to schedule.   

## 2023-03-09 NOTE — Progress Notes (Signed)
   VISIT TYPE: FOLLOW UP   Chief Complaint  Patient presents with   Back Pain    Patient has lbp that radiates to the back of his thigh he was injured at work 10 years ago.  He has some loss of feeling in the thigh     Encounter Diagnoses  Name Primary?   Lumbar pain    Radicular leg pain Yes    Assessment and Plan: Recurrent back pain, currently mild symptoms without surgical indications recommend anti-inflammatory physical therapy follow-up 6 weeks  History 62 year old male injured 10 years ago at work had some numbness in the medial part of his left thigh this has been persistent since the injury.  A few weeks ago his back started hurting and the pain became severe he took some Tylenol and it has subsequently subsided he still has the chronic numbness on the medial side of his left thigh  BP 123/86   Pulse 65   Ht 6' (1.829 m)   Wt 168 lb (76.2 kg)   BMI 22.78 kg/m   Ortho Exam  Tenderness in the right lower back negative straight leg raises bilaterally normal reflexes bilaterally normal sensation bilaterally normal strength bilaterally   Imaging prior imaging shows degenerative disc disease  A/P Encounter Diagnoses  Name Primary?   Lumbar pain    Radicular leg pain Yes    Meds ordered this encounter  Medications   meloxicam (MOBIC) 7.5 MG tablet    Sig: Take 1 tablet (7.5 mg total) by mouth daily.    Dispense:  30 tablet    Refill:  5

## 2023-03-16 ENCOUNTER — Encounter (HOSPITAL_COMMUNITY): Payer: Self-pay

## 2023-03-16 ENCOUNTER — Ambulatory Visit (HOSPITAL_COMMUNITY): Payer: BC Managed Care – PPO | Attending: Orthopedic Surgery

## 2023-03-16 ENCOUNTER — Other Ambulatory Visit: Payer: Self-pay

## 2023-03-16 DIAGNOSIS — M5459 Other low back pain: Secondary | ICD-10-CM | POA: Insufficient documentation

## 2023-03-16 NOTE — Therapy (Incomplete)
OUTPATIENT PHYSICAL THERAPY THORACOLUMBAR EVALUATION   Patient Name: Daniel Burton MRN: [id number] DOB:[birth year], 62 y.o., male Today's Date: 03/16/2023  END OF SESSION:  PT End of Session - 03/16/23 1109     Visit Number 1    Authorization Type BCBS    PT Start Time 1105    PT Stop Time 1145    PT Time Calculation (min) 40 min             Past Medical History:  Diagnosis Date   Back pain with history of spinal surgery    Diabetes mellitus without complication (HCC)    type 2, no med, does not check blood sugar   High cholesterol    Tinnitus of right ear 10/18/2020   jugular bulb   Tubular adenoma 02/21/2021   Tubular adenoma, sessile serrated adenoma, colonoscopy, [facility],Dr.Brahmbhatt, recommended to have repeat colonoscopy May 2023   Past Surgical History:  Procedure Laterality Date   CERVICAL SPINE SURGERY  2021   ELBOW SURGERY Right 1979   HERNIA REPAIR     IR ANGIO INTRA EXTRACRAN SEL COM CAROTID INNOMINATE BILAT MOD SED  06/25/2020   IR ANGIO INTRA EXTRACRAN SEL COM CAROTID INNOMINATE BILAT MOD SED  10/16/2022   IR ANGIO INTRA EXTRACRAN SEL COM CAROTID INNOMINATE UNI R MOD SED  10/18/2020   IR ANGIO VERTEBRAL SEL SUBCLAVIAN INNOMINATE BILAT MOD SED  06/25/2020   IR ANGIO VERTEBRAL SEL VERTEBRAL UNI R MOD SED  10/16/2022   IR CT HEAD LTD  10/18/2020   IR RADIOLOGIST EVAL & MGMT  07/13/2020   IR RADIOLOGIST EVAL & MGMT  09/10/2020   IR RADIOLOGIST EVAL & MGMT  11/03/2020   IR RADIOLOGIST EVAL & MGMT  12/03/2020   IR RADIOLOGIST EVAL & MGMT  02/11/2021   IR RADIOLOGIST EVAL & MGMT  07/15/2021   IR RADIOLOGIST EVAL & MGMT  09/28/2021   IR RADIOLOGIST EVAL & MGMT  01/18/2022   IR RADIOLOGIST EVAL & MGMT  06/15/2022   IR TRANSCATH PLC STENT  INITIAL VEIN  INC ANGIOPLASTY  10/18/2020   IR US GUIDE VASC ACCESS RIGHT  10/18/2020   IR US GUIDE VASC ACCESS RIGHT  10/16/2022   IR VENO/JUGULAR RIGHT  10/18/2020   RADIOLOGY WITH ANESTHESIA N/A 10/18/2020   Procedure: Lessie Dings;   Surgeon: Julieanne Cotton, MD;  Location: [facility] OR;  Service: Radiology;  Laterality: N/A;   RADIOLOGY WITH ANESTHESIA N/A 10/16/2022   Procedure: Diagnostic catheter arteriogram with intent to treat with either angioplasty, or placement of a telescoping stent at the site of the previous right sigmoid sinus stent;  Surgeon: Julieanne Cotton, MD;  Location: [facility] OR;  Service: Radiology;  Laterality: N/A;   Patient Active Problem List   Diagnosis Date Noted   Difficulty swallowing solids 03/06/2023   Diverticular disease of colon 03/06/2023   Heartburn 02/08/2023   Left shoulder pain 11/02/2022   Type 2 diabetes mellitus without complication, without long-term current use of insulin (HCC) 07/13/2022   Tubular adenoma 02/21/2021   Elevated blood-pressure reading without diagnosis of hypertension 10/26/2020   Subjective pulsatile tinnitus of right ear 10/26/2020   Pulsatile tinnitus of right ear 10/18/2020   Allergic rhinitis 10/06/2020   Aneurysm of common carotid artery (HCC) 10/06/2020   Gastroesophageal reflux disease without esophagitis 10/06/2020   Prediabetes 10/06/2020   HLD (hyperlipidemia) 12/09/2019   Abnormal liver enzymes 11/25/2019   Screening for deficiency anemia 11/25/2019   Spinal stenosis in cervical region 02/13/2019   Chronic back  pain 10/29/2012    PCP: Lilyan Punt, MD  REFERRING PROVIDER: Vickki Hearing, MD  REFERRING DIAG: M54.50 (ICD-10-CM) - Lumbar pain M54.10 (ICD-10-CM) - Radicular leg pain  Rationale for Evaluation and Treatment: Rehabilitation  THERAPY DIAG:  Other low back pain  ONSET DATE: ***  SUBJECTIVE:                                                                                                                                                                                           SUBJECTIVE STATEMENT: Hurt my back maybe 10 years ago.    PERTINENT HISTORY:  ***  PAIN:  Are you having pain? {OPRCPAIN:27236}  PRECAUTIONS:  {Therapy precautions:24002}  RED FLAGS: {PT Red Flags:29287}   WEIGHT BEARING RESTRICTIONS: {Yes ***/No:24003}  FALLS:  Has patient fallen in last 6 months? {fallsyesno:27318}  LIVING ENVIRONMENT: Lives with: {OPRC lives with:25569::"lives with their family"} Lives in: {Lives in:25570} Stairs: {opstairs:27293} Has following equipment at home: {Assistive devices:23999}  OCCUPATION: ***  PLOF: {PLOF:24004}  PATIENT GOALS: ***  NEXT MD VISIT: ***  OBJECTIVE:  Note: Objective measures were completed at Evaluation unless otherwise noted.  DIAGNOSTIC FINDINGS:  ***  PATIENT SURVEYS:  {rehab surveys:24030}  COGNITION: Overall cognitive status: {cognition:24006}     SENSATION: {sensation:27233}  MUSCLE LENGTH: Hamstrings: Right *** deg; Left *** deg Maisie Fus test: Right *** deg; Left *** deg  POSTURE: {posture:25561}  PALPATION: ***  LUMBAR ROM:   AROM eval  Flexion   Extension   Right lateral flexion   Left lateral flexion   Right rotation   Left rotation    (Blank rows = not tested)  LOWER EXTREMITY ROM:     {AROM/PROM:27142}  Right eval Left eval  Hip flexion    Hip extension    Hip abduction    Hip adduction    Hip internal rotation    Hip external rotation    Knee flexion    Knee extension    Ankle dorsiflexion    Ankle plantarflexion    Ankle inversion    Ankle eversion     (Blank rows = not tested)  LOWER EXTREMITY MMT:    MMT Right eval Left eval  Hip flexion    Hip extension    Hip abduction    Hip adduction    Hip internal rotation    Hip external rotation    Knee flexion    Knee extension    Ankle dorsiflexion    Ankle plantarflexion    Ankle inversion    Ankle eversion     (Blank rows = not tested)  LUMBAR SPECIAL TESTS:  {lumbar special test:25242}  FUNCTIONAL TESTS:  {Functional tests:24029}  GAIT: Distance walked: *** Assistive device utilized: {Assistive devices:23999} Level of assistance: {Levels of  assistance:24026} Comments: ***  TREATMENT DATE:  03/16/23 physical therapy evaluation and HEP instruction                                                                                                                                 PATIENT EDUCATION:  Education details: Patient educated on exam findings, POC, scope of PT, HEP, and ***. Person educated: Patient Education method: Explanation, Demonstration, and Handouts Education comprehension: verbalized understanding, returned demonstration, verbal cues required, and tactile cues required  HOME EXERCISE PROGRAM: ***  ASSESSMENT:  CLINICAL IMPRESSION: Patient is a *** y.o. *** who was seen today for physical therapy evaluation and treatment for ***.   OBJECTIVE IMPAIRMENTS: {opptimpairments:25111}.   ACTIVITY LIMITATIONS: {activitylimitations:27494}  PARTICIPATION LIMITATIONS: {participationrestrictions:25113}  PERSONAL FACTORS: {Personal factors:25162} are also affecting patient's functional outcome.   REHAB POTENTIAL: Good  CLINICAL DECISION MAKING: Stable/uncomplicated  EVALUATION COMPLEXITY: Low   GOALS: Goals reviewed with patient? No  SHORT TERM GOALS: Target date: ***  *** Baseline: Goal status: INITIAL  2.  *** Baseline:  Goal status: INITIAL  3.  *** Baseline:  Goal status: INITIAL  4.  *** Baseline:  Goal status: INITIAL  5.  *** Baseline:  Goal status: INITIAL  6.  *** Baseline:  Goal status: INITIAL  LONG TERM GOALS: Target date: ***  *** Baseline:  Goal status: INITIAL  2.  *** Baseline:  Goal status: INITIAL  3.  *** Baseline:  Goal status: INITIAL  4.  *** Baseline:  Goal status: INITIAL  5.  *** Baseline:  Goal status: INITIAL  6.  *** Baseline:  Goal status: INITIAL  PLAN:  PT FREQUENCY: {rehab frequency:25116}  PT DURATION: {rehab duration:25117}  PLANNED INTERVENTIONS: 97164- PT Re-evaluation, 97110-Therapeutic exercises, 97530- Therapeutic  activity, 97112- Neuromuscular re-education, 97535- Self Care, 40981- Manual therapy, [id number]- Gait training, [phone]- Orthotic Fit/training, [id number]- Canalith repositioning, [id number]- Aquatic Therapy, [id number]- Splinting, Patient/Family education, Balance training, Stair training, Taping, Dry Needling, Joint mobilization, Joint manipulation, Spinal manipulation, Spinal mobilization, Scar mobilization, and DME instructions. Marland Kitchen  PLAN FOR NEXT SESSION: Review HEP and goals   11:10 AM, 03/16/23 Cecil Bixby Small Daija Routson MPT [place] [facility] [place] [phone]

## 2023-03-16 NOTE — Therapy (Signed)
Patient arrived for appointment; states he is no longer having any back or leg pain; no symptoms.  " I don't think I need to be here".  Did a quick screen and his strength and movement are all wfl.  Patient states he just needs to "start working out again".  Instructed patient to call MD if symptoms recur.  Patient agreeable.  No charge for visit today.   11:29 AM, 03/16/23 Abdoul Encinas Small Damyn Weitzel MPT [place] [facility] [place] [phone]

## 2023-04-02 ENCOUNTER — Ambulatory Visit (HOSPITAL_COMMUNITY): Payer: BC Managed Care – PPO

## 2023-04-04 ENCOUNTER — Ambulatory Visit (HOSPITAL_COMMUNITY)
Admission: RE | Admit: 2023-04-04 | Discharge: 2023-04-04 | Disposition: A | Discharge status: Home / Self Care | Payer: BC Managed Care – PPO | Source: Ambulatory Visit | Attending: Interventional Radiology | Admitting: Interventional Radiology

## 2023-04-04 DIAGNOSIS — I771 Stricture of artery: Secondary | ICD-10-CM | POA: Insufficient documentation

## 2023-04-04 DIAGNOSIS — I6782 Cerebral ischemia: Secondary | ICD-10-CM | POA: Diagnosis not present

## 2023-04-04 DIAGNOSIS — I6523 Occlusion and stenosis of bilateral carotid arteries: Secondary | ICD-10-CM | POA: Diagnosis not present

## 2023-04-04 DIAGNOSIS — H93A9 Pulsatile tinnitus, unspecified ear: Secondary | ICD-10-CM | POA: Diagnosis not present

## 2023-04-04 DIAGNOSIS — I672 Cerebral atherosclerosis: Secondary | ICD-10-CM | POA: Diagnosis not present

## 2023-04-04 LAB — POCT I-STAT CREATININE: Creatinine, Ser: 1.2 mg/dL (ref 0.61–1.24)

## 2023-04-04 MED ORDER — IOHEXOL 350 MG/ML SOLN
75.0000 mL | Freq: Once | INTRAVENOUS | Status: AC | PRN
  Administered 2023-04-04: 75 mL via INTRAVENOUS

## 2023-04-10 ENCOUNTER — Ambulatory Visit: Payer: BC Managed Care – PPO | Admitting: Family Medicine

## 2023-04-10 VITALS — BP 138/78 | HR 77 | Temp 98.2°F | Ht 72.0 in | Wt 171.4 lb

## 2023-04-10 DIAGNOSIS — Z7984 Long term (current) use of oral hypoglycemic drugs: Secondary | ICD-10-CM | POA: Diagnosis not present

## 2023-04-10 DIAGNOSIS — E785 Hyperlipidemia, unspecified: Secondary | ICD-10-CM

## 2023-04-10 DIAGNOSIS — Z125 Encounter for screening for malignant neoplasm of prostate: Secondary | ICD-10-CM

## 2023-04-10 DIAGNOSIS — E119 Type 2 diabetes mellitus without complications: Secondary | ICD-10-CM

## 2023-04-10 DIAGNOSIS — E1169 Type 2 diabetes mellitus with other specified complication: Secondary | ICD-10-CM

## 2023-04-10 DIAGNOSIS — Z79899 Other long term (current) drug therapy: Secondary | ICD-10-CM

## 2023-04-10 MED ORDER — METFORMIN HCL ER 500 MG PO TB24: 1000.0000 mg | ORAL_TABLET | Freq: Every day | ORAL | 1 refills | Status: DC

## 2023-04-10 MED ORDER — ROSUVASTATIN CALCIUM 40 MG PO TABS: ORAL_TABLET | ORAL | 1 refills | Status: DC

## 2023-04-10 NOTE — Progress Notes (Signed)
 Subjective:    Patient ID: Daniel Burton, male    DOB: September 20, 1960, 63 y.o.   MRN: [id number]  Discussed the use of AI scribe software for clinical note transcription with the patient, who gave verbal consent to proceed.  History of Present Illness   The patient, with a known history of diabetes, presents with concerns about dietary habits and blood glucose control. He reports a recent shift from a healthier diet, consisting of eggs, strawberries, and baked chicken, to a less controlled diet with increased fast food intake and unhealthy snacks, such as biscuits and oatmeal cookies. This change was attributed to a demanding work schedule, working 12-hour shifts from 7 PM to 7 AM, which has led to fatigue and less time for meal preparation.  The patient acknowledges the negative impact of his current diet on his blood glucose levels, with a recent reading of 200 mg/dL. He expresses a desire to return to healthier eating habits, including the consumption of pre-made salads and baked chicken. However, he also admits to struggling with the monotony of his previous diet and the temptation of sweet snacks.  In addition to dietary concerns, the patient mentions the use of metformin  and a cholesterol medication, which he believes may be causing heartburn. He expresses a preference for taking all his medications in the morning, after breakfast, to avoid forgetting doses later in the day.  The patient also discusses his work schedule and its impact on his sleep and exercise routines. He typically sleeps from around 8:30 AM to 3 PM after his shift, leaving little time for physical activity. 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  In summary, the patient is struggling with dietary control and medication management in the context of a demanding work schedule, leading to suboptimal blood glucose control. He expresses a  desire to improve his diet and increase physical activity, but faces challenges in implementing these changes.         Review of Systems     Objective:    Physical Exam   CARDIOVASCULAR: Normal heart rhythm, no murmurs. EXTREMITIES: No edema in legs. NEUROLOGICAL: Normal pulses in feet, no numbness.    General-in no acute distress Eyes-no discharge Lungs-respiratory rate normal, CTA CV-no murmurs,RRR Extremities skin warm dry no edema Neuro grossly normal Behavior normal, alert         Assessment & Plan:  Assessment and Plan    Type 2 Diabetes Mellitus Poor dietary control with consumption of biscuits, regular soda, and fast food. Patient is on Metformin , but experiences heartburn and stomach upset if not taken with food. -Encouraged healthier dietary choices, including pre-made salads, baked/broiled meats, and fruits with lower sugar content. -Continue Metformin  as prescribed, ensuring it is taken with food to minimize gastrointestinal side effects. -Check blood glucose levels and urine for protein in early March.  Hyperlipidemia Patient is on cholesterol medication but experiences heartburn. -Continue cholesterol medication as prescribed. -Consider evaluation for potential medication-induced heartburn.  General Health Maintenance -Discussed the benefits of regular physical activity, such as walking and refereeing basketball games. -Consider pneumonia vaccine to reduce risk of pneumococcal pneumonia. -Schedule eye exam for diabetic retinopathy screening. -Return for follow-up visit in June.        1. Type 2 diabetes mellitus without complication, without long-term current use of insulin  (HCC) (Primary) Subpar control of diabetes Patient has had increased carbohydrates in his diet He is taking his medication Previous  A1c has been elevated If the A1c is not dramatically better by the time he checks it February I would recommend adding medications  2.  Hyperlipidemia associated with type 2 diabetes mellitus (HCC) The goal is to get LDL below 70 he is continue his statin healthy diet check labs 5 February  3. High risk medication use Check liver functions history elevated liver enzymes this is  4. Screening PSA (prostate specific antigen) Screening PSA

## 2023-04-10 NOTE — Patient Instructions (Signed)
 Do bloodwork first part of March

## 2023-04-11 ENCOUNTER — Other Ambulatory Visit (HOSPITAL_COMMUNITY): Payer: Self-pay | Admitting: Interventional Radiology

## 2023-04-11 ENCOUNTER — Telehealth: Payer: Self-pay

## 2023-04-11 DIAGNOSIS — H93A9 Pulsatile tinnitus, unspecified ear: Secondary | ICD-10-CM

## 2023-04-11 DIAGNOSIS — I771 Stricture of artery: Secondary | ICD-10-CM

## 2023-04-11 NOTE — Progress Notes (Signed)
 Care Guide Pharmacy Note  04/11/2023 Name: [name] MRN: [id number] DOB: 1961/01/14  Referred By: Bennet Brasil, MD Reason for referral: Care Coordination ([facility] Diabetes. )   Daniel Burton is a 63 y.o. year old male who is a primary care patient of Bennet Brasil, MD.  CYRILL LUCCHESE was referred to the pharmacist for assistance related to: DMII  Successful contact was made with the patient to discuss pharmacy services including being ready for the pharmacist to call at least 5 minutes before the scheduled appointment time and to have medication bottles and any blood pressure readings ready for review. The patient agreed to meet with the pharmacist via telephone visit on (date/time). 05/02/23 at 10:00 a.m.   [facility]  [facility], [facility] Management Assistant Direct Dial: [phone]  Fax: [phone]

## 2023-04-11 NOTE — Addendum Note (Signed)
 Addended byHugo Maes on: 04/11/2023 11:43 AM   Modules accepted: Orders

## 2023-04-18 NOTE — Progress Notes (Deleted)
   There were no vitals taken for this visit.  There is no height or weight on file to calculate BMI.  No chief complaint on file.   No diagnosis found.  DOI/DOS/ Date: NA  Resolved Patient declined physical therapy his pain resolved.

## 2023-04-19 ENCOUNTER — Ambulatory Visit (HOSPITAL_COMMUNITY)
Admission: RE | Admit: 2023-04-19 | Discharge: 2023-04-19 | Disposition: A | Discharge status: Home / Self Care | Payer: BC Managed Care – PPO | Source: Ambulatory Visit | Attending: Interventional Radiology | Admitting: Interventional Radiology

## 2023-04-19 DIAGNOSIS — R519 Headache, unspecified: Secondary | ICD-10-CM | POA: Diagnosis not present

## 2023-04-19 DIAGNOSIS — H93A9 Pulsatile tinnitus, unspecified ear: Secondary | ICD-10-CM

## 2023-04-19 DIAGNOSIS — I771 Stricture of artery: Secondary | ICD-10-CM

## 2023-04-20 ENCOUNTER — Ambulatory Visit: Payer: BC Managed Care – PPO | Admitting: Orthopedic Surgery

## 2023-04-20 HISTORY — PX: IR RADIOLOGIST EVAL & MGMT: IMG5224

## 2023-05-02 ENCOUNTER — Other Ambulatory Visit: Payer: Self-pay | Admitting: Pharmacist

## 2023-05-02 NOTE — Progress Notes (Signed)
 05/02/2023 Name: Daniel Burton MRN: [id number] DOB: October 04, 1960  Chief Complaint  Patient presents with   Diabetes    Daniel Burton is a 63 y.o. year old male who presented for a telephone visit.   They were referred to the pharmacist by a quality report for assistance in managing diabetes.    Subjective:  Care Team: Primary Care Provider: Alphonsa Glendia LABOR, MD ; Next Scheduled Visit: 08/2023  Medication Access/Adherence  Current Pharmacy:  [facility] [phone] - [place], [place] - [address] AT [facility] [address] [place] [address] Phone: [phone] Fax: [phone]  Patient reports affordability concerns with their medications: No  Patient reports access/transportation concerns to their pharmacy: No  Patient reports adherence concerns with their medications:  No    Diabetes:  Current medications: metformin  Medications tried in the past: n/a  Current glucose readings: reports 90-110 Using relion meter; testing 3 times weekly Needs to pick up new test strips at [facility] --accuchek, contour &one touch are not covered under insurance  Patient denies hypoglycemic s/sx including dizziness, shakiness, sweating. Patient denies hyperglycemic symptoms including polyuria, polydipsia, polyphagia, nocturia, neuropathy, blurred vision.  Current meal patterns:  The patient is asked to make an attempt to improve diet and exercise patterns to aid in medical management of this problem. Patient reports he has decreased dessert  Current physical activity: encouraged as able   Current medication access support: [employer]   Objective:  Lab Results  Component Value Date   HGBA1C 10.4 (H) 11/28/2022    Lab Results  Component Value Date   CREATININE 1.20 04/04/2023   BUN 17 10/16/2022   NA 136 10/16/2022   K 4.2 10/16/2022   CL 101 10/16/2022   CO2 24 10/16/2022    Lab Results  Component Value Date   CHOL 228 (H) 11/28/2022   HDL 39  (L) 11/28/2022   LDLCALC 170 (H) 11/28/2022   TRIG 105 11/28/2022   CHOLHDL 5.8 (H) 11/28/2022    Medications Reviewed Today     Reviewed by Billee Mliss BIRCH, [facility] (Pharmacist) on 05/02/23 at 1003  Med List Status: <None>   Medication Order Taking? Sig Documenting Provider Last Dose Status Informant  aspirin  EC 81 MG tablet [id number] No Take 1 tablet (81 mg total) by mouth daily. Swallow whole. Neita Kirsch A, PA-C Taking Active Self  meloxicam  (MOBIC ) 7.5 MG tablet [id number] No Take 1 tablet (7.5 mg total) by mouth daily.  Patient not taking: Reported on 04/10/2023   Margrette Taft BRAVO, MD Not Taking Active   metFORMIN  (GLUCOPHAGE -XR) 500 MG 24 hr tablet [id number]  Take 2 tablets (1,000 mg total) by mouth daily with breakfast. Alphonsa Glendia LABOR, MD  Active   rosuvastatin  (CRESTOR ) 40 MG tablet [id number]  One each evening Luking, Glendia LABOR, MD  Active              Assessment/Plan:   Diabetes: - Currently uncontrolled, but reports improved blood sugars - Reviewed long term cardiovascular and renal outcomes of uncontrolled blood sugar - Reviewed goal A1c, goal fasting, and goal 2 hour post prandial glucose - Reviewed dietary modifications including FOLLOWING A HEART HEALTHY DIET/HEALTHY PLATE METHOD - Reviewed lifestyle modifications including:  - Recommend to continue current regimen  - Patient denies personal or family history of multiple endocrine neoplasia type 2, medullary thyroid cancer; personal history of pancreatitis or gallbladder disease. - Recommend to check glucose daily (fasting) or if symptomatic   Follow Up Plan: PCP in 08/2023  Gaynell Eggleton Dattero Jevante Hollibaugh, PharmD, BCACP, CPP Clinical Pharmacist, [facility]

## 2023-05-10 ENCOUNTER — Telehealth: Payer: Self-pay | Admitting: *Deleted

## 2023-05-10 NOTE — Telephone Encounter (Signed)
Patient was identified as falling into the [place] Measure - Diabetes.   Patient was: Referred to pharmacy for chronic disease management.  Patient just referred to clinical pharmacist for diabetes management and had first visit 05/02/23

## 2023-07-02 DIAGNOSIS — E119 Type 2 diabetes mellitus without complications: Secondary | ICD-10-CM | POA: Diagnosis not present

## 2023-07-02 DIAGNOSIS — H01002 Unspecified blepharitis right lower eyelid: Secondary | ICD-10-CM | POA: Diagnosis not present

## 2023-07-02 DIAGNOSIS — H01001 Unspecified blepharitis right upper eyelid: Secondary | ICD-10-CM | POA: Diagnosis not present

## 2023-07-02 DIAGNOSIS — H40013 Open angle with borderline findings, low risk, bilateral: Secondary | ICD-10-CM | POA: Diagnosis not present

## 2023-07-02 LAB — HM DIABETES EYE EXAM

## 2023-07-28 ENCOUNTER — Ambulatory Visit: Payer: Self-pay

## 2023-07-29 ENCOUNTER — Ambulatory Visit
Admission: EM | Admit: 2023-07-29 | Discharge: 2023-07-29 | Disposition: A | Discharge status: Home / Self Care | Attending: Family Medicine | Admitting: Family Medicine

## 2023-07-29 DIAGNOSIS — J029 Acute pharyngitis, unspecified: Secondary | ICD-10-CM

## 2023-07-29 LAB — POCT RAPID STREP A (OFFICE): Rapid Strep A Screen: NEGATIVE

## 2023-07-29 MED ORDER — LIDOCAINE VISCOUS HCL 2 % MT SOLN: 10.0000 mL | OROMUCOSAL | 0 refills | Status: DC | PRN

## 2023-07-29 MED ORDER — AZELASTINE HCL 0.1 % NA SOLN: 1.0000 | Freq: Two times a day (BID) | NASAL | 0 refills | Status: DC

## 2023-07-29 NOTE — ED Triage Notes (Signed)
Pt reports sore throat x 2 days

## 2023-07-29 NOTE — Discharge Instructions (Signed)
 Your strep test was negative today.  I suspect either postnasal drainage or a viral infection to be causing your symptoms.  I have sent in a drying nasal spray and some numbing liquid for you to gargle.  You may also use throat lozenges, DayQuil and NyQuil type medications, antihistamines as needed for symptoms.

## 2023-08-02 NOTE — ED Provider Notes (Signed)
 [facility]    CSN: [id number] Arrival date & time: 07/29/23  0806      History   Chief Complaint No chief complaint on file.   HPI Daniel Burton is a 63 y.o. male.   Patient presenting today with 2-day history of sore throat, postnasal drainage.  Denies fever, chills, cough, chest pain, shortness of breath, abdominal pain, vomiting, diarrhea.  So far trying salt water gargles and throat lozenges with mild temporary benefit.  No known sick contacts recently.    Past Medical History:  Diagnosis Date   Back pain with history of spinal surgery    Diabetes mellitus without complication (HCC)    type 2, no med, does not check blood sugar   High cholesterol    Tinnitus of right ear 10/18/2020   jugular bulb   Tubular adenoma 02/21/2021   Tubular adenoma, sessile serrated adenoma, colonoscopy, [facility],Dr.Brahmbhatt, recommended to have repeat colonoscopy May 2023    Patient Active Problem List   Diagnosis Date Noted   Difficulty swallowing solids 03/06/2023   Diverticular disease of colon 03/06/2023   Heartburn 02/08/2023   Left shoulder pain 11/02/2022   Type 2 diabetes mellitus without complication, without long-term current use of insulin  (HCC) 07/13/2022   Tubular adenoma 02/21/2021   Elevated blood-pressure reading without diagnosis of hypertension 10/26/2020   Subjective pulsatile tinnitus of right ear 10/26/2020   Pulsatile tinnitus of right ear 10/18/2020   Allergic rhinitis 10/06/2020   Aneurysm of common carotid artery (HCC) 10/06/2020   Gastroesophageal reflux disease without esophagitis 10/06/2020   Prediabetes 10/06/2020   HLD (hyperlipidemia) 12/09/2019   Abnormal liver enzymes 11/25/2019   Screening for deficiency anemia 11/25/2019   Spinal stenosis in cervical region 02/13/2019   Chronic back pain 10/29/2012    Past Surgical History:  Procedure Laterality Date   CERVICAL SPINE SURGERY  2021   ELBOW SURGERY Right 1979   HERNIA REPAIR      IR ANGIO INTRA EXTRACRAN SEL COM CAROTID INNOMINATE BILAT MOD SED  06/25/2020   IR ANGIO INTRA EXTRACRAN SEL COM CAROTID INNOMINATE BILAT MOD SED  10/16/2022   IR ANGIO INTRA EXTRACRAN SEL COM CAROTID INNOMINATE UNI R MOD SED  10/18/2020   IR ANGIO VERTEBRAL SEL SUBCLAVIAN INNOMINATE BILAT MOD SED  06/25/2020   IR ANGIO VERTEBRAL SEL VERTEBRAL UNI R MOD SED  10/16/2022   IR CT HEAD LTD  10/18/2020   IR RADIOLOGIST EVAL & MGMT  07/13/2020   IR RADIOLOGIST EVAL & MGMT  09/10/2020   IR RADIOLOGIST EVAL & MGMT  11/03/2020   IR RADIOLOGIST EVAL & MGMT  12/03/2020   IR RADIOLOGIST EVAL & MGMT  02/11/2021   IR RADIOLOGIST EVAL & MGMT  07/15/2021   IR RADIOLOGIST EVAL & MGMT  09/28/2021   IR RADIOLOGIST EVAL & MGMT  01/18/2022   IR RADIOLOGIST EVAL & MGMT  06/15/2022   IR RADIOLOGIST EVAL & MGMT  04/20/2023   IR TRANSCATH PLC STENT  INITIAL VEIN  INC ANGIOPLASTY  10/18/2020   IR US  GUIDE VASC ACCESS RIGHT  10/18/2020   IR US  GUIDE VASC ACCESS RIGHT  10/16/2022   IR VENO/JUGULAR RIGHT  10/18/2020   RADIOLOGY WITH ANESTHESIA N/A 10/18/2020   Procedure: Milly Almas;  Surgeon: Luellen Sages, MD;  Location: [facility] OR;  Service: Radiology;  Laterality: N/A;   RADIOLOGY WITH ANESTHESIA N/A 10/16/2022   Procedure: Diagnostic catheter arteriogram with intent to treat with either angioplasty, or placement of a telescoping stent at the  site of the previous right sigmoid sinus stent;  Surgeon: Luellen Sages, MD;  Location: [facility] OR;  Service: Radiology;  Laterality: N/A;       Home Medications    Prior to Admission medications   Medication Sig Start Date End Date Taking? Authorizing Provider  azelastine (ASTELIN) 0.1 % nasal spray Place 1 spray into both nostrils 2 (two) times daily. Use in each nostril as directed 07/29/23  Yes Corbin Dess, PA-C  lidocaine  (XYLOCAINE ) 2 % solution Use as directed 10 mLs in the mouth or throat every 3 (three) hours as needed. 07/29/23  Yes Corbin Dess, PA-C  aspirin   EC 81 MG tablet Take 1 tablet (81 mg total) by mouth daily. Swallow whole. 08/17/22   Marilu Shown, PA-C  meloxicam  (MOBIC ) 7.5 MG tablet Take 1 tablet (7.5 mg total) by mouth daily. Patient not taking: Reported on 04/10/2023 03/09/23   Darrin Emerald, MD  metFORMIN  (GLUCOPHAGE -XR) 500 MG 24 hr tablet Take 2 tablets (1,000 mg total) by mouth daily with breakfast. 04/10/23   Bennet Brasil, MD  rosuvastatin  (CRESTOR ) 40 MG tablet One each evening 04/10/23   Bennet Brasil, MD    Family History Family History  Problem Relation Age of Onset   Diabetes Mother    Heart attack Mother    Throat cancer Father    Liver cancer Sister    Thyroid disease Sister    Diabetes Brother    Allergic rhinitis Neg Hx    Angioedema Neg Hx    Asthma Neg Hx    Urticaria Neg Hx     Social History Social History   Tobacco Use   Smoking status: Former    Types: Cigarettes   Smokeless tobacco: Former  Building services engineer status: Never Used  Substance Use Topics   Alcohol use: No   Drug use: Not Currently     Allergies   Patient has no known allergies.   Review of Systems Review of Systems Per HPI  Physical Exam Triage Vital Signs ED Triage Vitals [07/29/23 0827]  Encounter Vitals Group     BP 103/66     Systolic BP Percentile      Diastolic BP Percentile      Pulse Rate 81     Resp 18     Temp 98.1 F (36.7 C)     Temp Source Oral     SpO2 97 %     Weight      Height      Head Circumference      Peak Flow      Pain Score 9     Pain Loc      Pain Education      Exclude from Growth Chart    No data found.  Updated Vital Signs BP 103/66 (BP Location: Right Arm)   Pulse 81   Temp 98.1 F (36.7 C) (Oral)   Resp 18   SpO2 97%   Visual Acuity Right Eye Distance:   Left Eye Distance:   Bilateral Distance:    Right Eye Near:   Left Eye Near:    Bilateral Near:     Physical Exam Vitals and nursing note reviewed.  Constitutional:      Appearance: He is  well-developed.  HENT:     Head: Atraumatic.     Right Ear: External ear normal.     Left Ear: External ear normal.     Nose: Nose normal.  Mouth/Throat:     Pharynx: Posterior oropharyngeal erythema present. No oropharyngeal exudate.     Comments: Mild posterior oropharyngeal erythema, cobblestoning Eyes:     Conjunctiva/sclera: Conjunctivae normal.     Pupils: Pupils are equal, round, and reactive to light.  Cardiovascular:     Rate and Rhythm: Normal rate and regular rhythm.  Pulmonary:     Effort: Pulmonary effort is normal. No respiratory distress.     Breath sounds: No wheezing or rales.  Musculoskeletal:        General: Normal range of motion.     Cervical back: Normal range of motion and neck supple.  Lymphadenopathy:     Cervical: No cervical adenopathy.  Skin:    General: Skin is warm and dry.  Neurological:     Mental Status: He is alert and oriented to person, place, and time.  Psychiatric:        Behavior: Behavior normal.    UC Treatments / Results  Labs (all labs ordered are listed, but only abnormal results are displayed) Labs Reviewed  POCT RAPID STREP A (OFFICE)    EKG   Radiology No results found.  Procedures Procedures (including critical care time)  Medications Ordered in UC Medications - No data to display  Initial Impression / Assessment and Plan / UC Course  I have reviewed the triage vital signs and the nursing notes.  Pertinent labs & imaging results that were available during my care of the patient were reviewed by me and considered in my medical decision making (see chart for details).     Suspect allergic versus viral pharyngitis.  Rapid strep negative.  Will treat with Astelin nasal spray, viscous lidocaine , supportive over-the-counter medications and home care.  Return for worsening symptoms.  Final Clinical Impressions(s) / UC Diagnoses   Final diagnoses:  Acute pharyngitis, unspecified etiology     Discharge  Instructions      Your strep test was negative today.  I suspect either postnasal drainage or a viral infection to be causing your symptoms.  I have sent in a drying nasal spray and some numbing liquid for you to gargle.  You may also use throat lozenges, DayQuil and NyQuil type medications, antihistamines as needed for symptoms.  ED Prescriptions     Medication Sig Dispense Auth. Provider   lidocaine  (XYLOCAINE ) 2 % solution Use as directed 10 mLs in the mouth or throat every 3 (three) hours as needed. 100 mL Corbin Dess, PA-C   azelastine (ASTELIN) 0.1 % nasal spray Place 1 spray into both nostrils 2 (two) times daily. Use in each nostril as directed 30 mL Corbin Dess, PA-C      PDMP not reviewed this encounter.   Corbin Dess, [place] 08/02/23 1415

## 2023-08-09 ENCOUNTER — Ambulatory Visit: Admitting: Physician Assistant

## 2023-08-09 ENCOUNTER — Encounter: Payer: Self-pay | Admitting: Physician Assistant

## 2023-08-09 VITALS — BP 106/64 | HR 67 | Temp 97.5°F | Ht 72.0 in | Wt 164.2 lb

## 2023-08-09 DIAGNOSIS — S56911A Strain of unspecified muscles, fascia and tendons at forearm level, right arm, initial encounter: Secondary | ICD-10-CM | POA: Diagnosis not present

## 2023-08-09 DIAGNOSIS — E119 Type 2 diabetes mellitus without complications: Secondary | ICD-10-CM

## 2023-08-09 MED ORDER — PREDNISONE 20 MG PO TABS: 40.0000 mg | ORAL_TABLET | Freq: Every day | ORAL | 0 refills | Status: AC

## 2023-08-09 NOTE — Assessment & Plan Note (Signed)
 This patient presents with 1-2 weeks of right inner forearm tingling following a labor intensive job. Reassuring, benign exam today. Considered, but doubt, acute fracture including open fracture. Low index of suspicion for a dislocation. Doubt other acute causes of pain at this time. NSAIDs for pain control, steroid for inflammation. WBAT/activity as tolerated . Warning signs and return precautions discussed.

## 2023-08-09 NOTE — Progress Notes (Signed)
   Acute Office Visit  Subjective:     Patient ID: Daniel Burton, male    DOB: 29-Jun-1960, 63 y.o.   MRN: [id number]   Patient presents today with concerns of right forearm tingling. 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 He correlates symptoms to this job. He states symptoms begin at his elbow and travel to his thumb. He denies back, neck, or shoulder pain. He does relate previous right fractured arm leading to decrease ROM, specifically supination and pronation. He denies weakness to affected arm.      Review of Systems  Constitutional:  Negative for fever, malaise/fatigue and weight loss.  Musculoskeletal:  Negative for back pain, falls, joint pain, myalgias and neck pain.  Neurological:  Positive for tingling. Negative for tremors, sensory change, weakness and headaches.        Objective:     BP 106/64   Pulse 67   Temp (!) 97.5 F (36.4 C)   Ht 6' (1.829 m)   Wt 164 lb 3.2 oz (74.5 kg)   SpO2 97%   BMI 22.27 kg/m   Physical Exam Constitutional:      Appearance: Normal appearance. He is normal weight.  HENT:     Head: Normocephalic and atraumatic.  Cardiovascular:     Rate and Rhythm: Normal rate and regular rhythm.     Heart sounds: Normal heart sounds. No murmur heard. Pulmonary:     Effort: Pulmonary effort is normal.     Breath sounds: Normal breath sounds.  Musculoskeletal:     Right shoulder: No swelling or tenderness. Normal range of motion.     Right elbow: No swelling or deformity. Decreased range of motion.     Right forearm: No swelling or tenderness.     Right wrist: No swelling, tenderness or snuff box tenderness. Normal range of motion. Normal pulse.  Skin:    General: Skin is warm and dry.     Capillary Refill: Capillary refill takes less than 2 seconds.  Neurological:     General: No focal deficit present.     Mental Status: He is alert and oriented to person, place, and time.     No results found for any visits  on 08/09/23.      Assessment & Plan:  Strain of right forearm, initial encounter Assessment & Plan: This patient presents with 1-2 weeks of right inner forearm tingling following a labor intensive job. Reassuring, benign exam today. Considered, but doubt, acute fracture including open fracture. Low index of suspicion for a dislocation. Doubt other acute causes of pain at this time. NSAIDs for pain control, steroid for inflammation. WBAT/activity as tolerated . Warning signs and return precautions discussed.  Orders: -     predniSONE ; Take 2 tablets (40 mg total) by mouth daily for 5 days.  Dispense: 10 tablet; Refill: 0  Type 2 diabetes mellitus without complication, without long-term current use of insulin  [facility]) Assessment & Plan: Patient reports good control. Sugars reportedly in the low 100's. He reports recent diet changes to include decreased carb intake. I advised patient of previous lab work ordered. I encouraged him to proceed with lab work today. Encouraged continued compliance with medications and follow up.       Return if symptoms worsen or fail to improve.  Jearlean Mince Emalynn Clewis, PA-C

## 2023-08-09 NOTE — Assessment & Plan Note (Signed)
 Patient reports good control. Sugars reportedly in the low 100's. He reports recent diet changes to include decreased carb intake. I advised patient of previous lab work ordered. I encouraged him to proceed with lab work today. Encouraged continued compliance with medications and follow up.

## 2023-08-13 ENCOUNTER — Other Ambulatory Visit: Payer: Self-pay | Admitting: Physician Assistant

## 2023-08-13 MED ORDER — ASPIRIN 81 MG PO TBEC
81.0000 mg | DELAYED_RELEASE_TABLET | Freq: Every day | ORAL | 12 refills | Status: AC
Start: 2023-08-13 — End: ?

## 2023-09-07 ENCOUNTER — Ambulatory Visit: Payer: BC Managed Care – PPO | Admitting: Family Medicine

## 2023-09-07 VITALS — BP 104/74 | HR 90 | Temp 97.7°F | Ht 72.0 in | Wt 162.0 lb

## 2023-09-07 DIAGNOSIS — T148XXA Other injury of unspecified body region, initial encounter: Secondary | ICD-10-CM

## 2023-09-07 DIAGNOSIS — R202 Paresthesia of skin: Secondary | ICD-10-CM | POA: Diagnosis not present

## 2023-09-07 DIAGNOSIS — Z7984 Long term (current) use of oral hypoglycemic drugs: Secondary | ICD-10-CM

## 2023-09-07 DIAGNOSIS — E119 Type 2 diabetes mellitus without complications: Secondary | ICD-10-CM | POA: Diagnosis not present

## 2023-09-07 MED ORDER — ROSUVASTATIN CALCIUM 40 MG PO TABS: ORAL_TABLET | ORAL | 1 refills | Status: AC

## 2023-09-07 MED ORDER — METFORMIN HCL ER 500 MG PO TB24: 1000.0000 mg | ORAL_TABLET | Freq: Every day | ORAL | 1 refills | Status: AC

## 2023-09-07 NOTE — Progress Notes (Unsigned)
   Subjective:    Patient ID: Daniel Burton, male    DOB: 1960-10-18, 63 y.o.   MRN: [id number]  HPI  Reported tingling in right hand right thumb area 2 to 3 weeks now Was given nsaids for it at prev o/v Discussed the use of AI scribe software for clinical note transcription with the patient, who gave verbal consent to proceed.  History of Present Illness   Daniel Burton is a 63 year old male with diabetes who presents with right arm tingling and numbness.  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 The symptoms primarily affect his right thumb and extend up the arm to the elbow. He describes the sensation as a 'tingle' and 'numb,' occurring daily but not severe enough to disrupt sleep or cause weakness. He has a history of a broken right hand and elbow during high school.  No pain in the neck or shoulder and no weakness in his grip or thumb. The tingling does not wake him at night, and he maintains normal strength in his upper body. He suspects the symptoms may be related to repetitive motion from his previous job, but his current job does not require similar movements.  He has a history of diabetes and mentions that his blood sugar control fluctuates, particularly when he eats out. He takes his diabetes medication daily, including metformin , and also takes aspirin  and cholesterol medication as part of his regimen.      Review of Systems     Objective:   Physical Exam  General-in no acute distress Eyes-no discharge Lungs-respiratory rate normal, CTA CV-no murmurs,RRR Extremities skin warm dry no edema Neuro grossly normal Behavior normal, alert Diabetic foot exam normal bilateral      Assessment & Plan:   Assessment and Plan    Nerve Compression Numbness and tingling in the right thumb extending up the arm, likely due to repetitive motion. Differential includes  nerve compression at the elbow or higher. Muscle strength intact, no severe nerve damage. - Monitor for muscle strength loss in hand or arm. - Consider nerve conduction study if symptoms persist beyond 6-8 weeks or if muscle weakness develops. - Educated on signs of worsening condition, such as loss of strength.  Diabetes Mellitus Inconsistent diabetes management with fluctuating control. Regular metformin  use. Blood work needed to assess control and potential medication adjustments. - Order blood work including A1c levels. - Consider medication adjustments based on results. - Encourage consistent dietary habits. - Discuss potential need for additional medications if A1c is not at target.  General Health Maintenance Due for colonoscopy in 2026. Regular eye exams maintained. Discussed pneumonia vaccine due to diabetes. - Schedule colonoscopy for spring 2026. - Ensure annual eye exams. - Discuss and consider pneumonia vaccination.     Patient will do his lab work await the findings of this patient will do follow-up visit later this year

## 2023-09-14 DIAGNOSIS — Z79899 Other long term (current) drug therapy: Secondary | ICD-10-CM | POA: Diagnosis not present

## 2023-09-14 DIAGNOSIS — E119 Type 2 diabetes mellitus without complications: Secondary | ICD-10-CM | POA: Diagnosis not present

## 2023-09-14 DIAGNOSIS — E785 Hyperlipidemia, unspecified: Secondary | ICD-10-CM | POA: Diagnosis not present

## 2023-09-14 DIAGNOSIS — Z125 Encounter for screening for malignant neoplasm of prostate: Secondary | ICD-10-CM | POA: Diagnosis not present

## 2023-09-14 DIAGNOSIS — E1169 Type 2 diabetes mellitus with other specified complication: Secondary | ICD-10-CM | POA: Diagnosis not present

## 2023-09-15 LAB — BASIC METABOLIC PANEL WITH GFR
BUN/Creatinine Ratio: 9 — ABNORMAL LOW (ref 10–24)
BUN: 10 mg/dL (ref 8–27)
CO2: 21 mmol/L (ref 20–29)
Calcium: 9.4 mg/dL (ref 8.6–10.2)
Chloride: 107 mmol/L — ABNORMAL HIGH (ref 96–106)
Creatinine, Ser: 1.06 mg/dL (ref 0.76–1.27)
Glucose: 117 mg/dL — ABNORMAL HIGH (ref 70–99)
Potassium: 4.3 mmol/L (ref 3.5–5.2)
Sodium: 143 mmol/L (ref 134–144)
eGFR: 79 mL/min/{1.73_m2} (ref >59)

## 2023-09-15 LAB — HEPATIC FUNCTION PANEL
ALT: 20 IU/L (ref 0–44)
AST: 26 IU/L (ref 0–40)
Albumin: 4.5 g/dL (ref 3.9–4.9)
Alkaline Phosphatase: 84 IU/L (ref 44–121)
Bilirubin Total: 0.3 mg/dL (ref 0.0–1.2)
Bilirubin, Direct: 0.12 mg/dL (ref 0.00–0.40)
Total Protein: 7 g/dL (ref 6.0–8.5)

## 2023-09-15 LAB — LIPID PANEL
Chol/HDL Ratio: 3.4 ratio (ref 0.0–5.0)
Cholesterol, Total: 168 mg/dL (ref 100–199)
HDL: 49 mg/dL (ref >39)
LDL Chol Calc (NIH): 106 mg/dL — ABNORMAL HIGH (ref 0–99)
Triglycerides: 68 mg/dL (ref 0–149)
VLDL Cholesterol Cal: 13 mg/dL (ref 5–40)

## 2023-09-15 LAB — HEMOGLOBIN A1C
Est. average glucose Bld gHb Est-mCnc: 192 mg/dL
Hgb A1c MFr Bld: 8.3 % — ABNORMAL HIGH (ref 4.8–5.6)

## 2023-09-15 LAB — MICROALBUMIN / CREATININE URINE RATIO
Creatinine, Urine: 191.7 mg/dL
Microalb/Creat Ratio: 6 mg/g{creat} (ref 0–29)
Microalbumin, Urine: 10.6 ug/mL

## 2023-09-15 LAB — PSA: Prostate Specific Ag, Serum: 0.6 ng/mL (ref 0.0–4.0)

## 2023-09-17 ENCOUNTER — Ambulatory Visit: Payer: Self-pay | Admitting: Family Medicine

## 2023-09-17 ENCOUNTER — Other Ambulatory Visit: Payer: Self-pay

## 2023-09-17 DIAGNOSIS — E1169 Type 2 diabetes mellitus with other specified complication: Secondary | ICD-10-CM

## 2023-09-17 DIAGNOSIS — E119 Type 2 diabetes mellitus without complications: Secondary | ICD-10-CM

## 2023-09-17 DIAGNOSIS — R030 Elevated blood-pressure reading, without diagnosis of hypertension: Secondary | ICD-10-CM

## 2023-09-17 MED ORDER — GLIPIZIDE 2.5 MG HALF TABLET: 2.5000 mg | ORAL_TABLET | Freq: Every day | ORAL | Status: AC

## 2023-09-21 ENCOUNTER — Encounter (HOSPITAL_COMMUNITY): Payer: Self-pay | Admitting: Interventional Radiology

## 2023-10-01 ENCOUNTER — Other Ambulatory Visit: Payer: Self-pay | Admitting: Orthopedic Surgery

## 2023-10-01 MED ORDER — PREDNISONE 10 MG (48) PO TBPK: ORAL_TABLET | ORAL | 0 refills | Status: DC

## 2023-10-01 NOTE — Telephone Encounter (Signed)
 DR. MARGRETTE  Patient came in the office requesting a refill on    PREDNISONE  10 MG TAB   PHARMACY;  [facility] [place]

## 2023-10-01 NOTE — Telephone Encounter (Signed)
 I didn't see where Dr VEAR gave prednisone  I did see Meloxicam   He is requesting prednisone / pended a taper

## 2023-10-02 ENCOUNTER — Encounter: Payer: Self-pay | Admitting: Nurse Practitioner

## 2023-10-02 ENCOUNTER — Ambulatory Visit: Admitting: Nurse Practitioner

## 2023-10-02 ENCOUNTER — Telehealth (HOSPITAL_COMMUNITY): Payer: Self-pay

## 2023-10-02 VITALS — BP 108/68 | HR 86 | Temp 97.9°F | Ht 72.0 in | Wt 161.8 lb

## 2023-10-02 DIAGNOSIS — M5441 Lumbago with sciatica, right side: Secondary | ICD-10-CM

## 2023-10-02 MED ORDER — METHOCARBAMOL 500 MG PO TABS: 500.0000 mg | ORAL_TABLET | Freq: Three times a day (TID) | ORAL | 0 refills | Status: DC | PRN

## 2023-10-02 MED ORDER — NAPROXEN 375 MG PO TABS
375.0000 mg | ORAL_TABLET | Freq: Two times a day (BID) | ORAL | 0 refills | Status: DC
Start: 2023-10-02 — End: 2024-02-08

## 2023-10-02 NOTE — Progress Notes (Signed)
 Subjective:    Patient ID: Daniel Burton, male    DOB: 02-09-1961, 63 y.o.   MRN: [id number]  HPI Presents with complaints of low back pain for the past 2 weeks.  Has had a few remote minor back injuries in the past, nothing recent.  Last seen for his back pain by Dr. Margrette in 2023.  Current job working at [employer] where he stands for 12 hours at a time.  Describes a tight feeling in the low back area, occasional radiation of pain down the right lateral leg to just above the knee.  This is similar to the pain he had previously.  States his legs feel weak and tired at times.  No change in bowel or bladder habits.  Has done swimming in the past which has helped.  Slight relief with topicals and warm baths with Epsom salts.  Dr. Margrette prescribed a Sterapred Dosepak which patient has picked up but has not started.  Patient denies any reflux symptoms.   Review of Systems See HPI. Social History   Tobacco Use   Smoking status: Former    Types: Cigarettes   Smokeless tobacco: Former  Building services engineer status: Never Used  Substance Use Topics   Alcohol use: No   Drug use: Not Currently       Objective:   Physical Exam Vitals and nursing note reviewed.  Constitutional:      General: He is not in acute distress. Cardiovascular:     Rate and Rhythm: Normal rate and regular rhythm.  Pulmonary:     Effort: Pulmonary effort is normal.     Breath sounds: Normal breath sounds.  Musculoskeletal:     Comments: Tenderness with palpation of the mid back area along the lower lumbar spine.  SLR positive on the left, negative on the right.  Can get on and off exam table without assistance.  Can perform active ROM of the spine with minimal limitation.  Neurological:     Mental Status: He is alert and oriented to person, place, and time.     Gait: Gait normal.     Deep Tendon Reflexes: Reflexes normal.     Comments: Patellar and Achilles reflexes normal and equal bilaterally.   Psychiatric:        Mood and Affect: Mood normal.        Behavior: Behavior normal.        Thought Content: Thought content normal.        Judgment: Judgment normal.    Today's Vitals   10/02/23 1544  BP: 108/68  Pulse: 86  Temp: 97.9 F (36.6 C)  SpO2: 97%  Weight: 161 lb 12.8 oz (73.4 kg)  Height: 6' (1.829 m)   Body mass index is 21.94 kg/m.  02/10/23: X-ray report  Chief complaint pain right lower back and right leg pain  Images 3 views lumbar  Reading: Is reasonable without any major abnormalities   The spaces are fairly competent with minimal spurring around the lips of the vertebrae no evidence of spondylolysis or listhesis  Impression: Mild degenerative disc changes no acute fracture subluxation dislocation or sign of infection or tumor        Assessment & Plan:   Problem List Items Addressed This Visit       Nervous and Auditory   Acute bilateral low back pain with right-sided sciatica - Primary   Relevant Medications   naproxen  (NAPROSYN ) 375 MG tablet   methocarbamol  (ROBAXIN ) 500  MG tablet   Meds ordered this encounter  Medications   naproxen  (NAPROSYN ) 375 MG tablet    Sig: Take 1 tablet (375 mg total) by mouth 2 (two) times daily with a meal.    Dispense:  30 tablet    Refill:  0    Supervising Provider:   ALPHONSA HAMILTON A [[id number]]   methocarbamol  (ROBAXIN ) 500 MG tablet    Sig: Take 1 tablet (500 mg total) by mouth every 8 (eight) hours as needed for muscle spasms.    Dispense:  30 tablet    Refill:  0    Supervising Provider:   ALPHONSA HAMILTON A [[id number]]   Because of his diabetes and a recent A1c of 8.3%, recommend he hold on oral steroids unless absolutely necessary. Trial of naproxen  twice a day with a meal for pain.  Discontinue if any acid reflux or stomach upset. Restart Robaxin  as directed.  Drowsiness precautions. Continue ice or heat applications.  OTC topicals including lidocaine  patches. Recommend gentle stretching.  Consider  resuming his swimming exercises which helps. Defers note for work. Return if symptoms worsen or fail to improve.

## 2023-10-02 NOTE — Patient Instructions (Signed)
 Lidocaine patch

## 2023-10-02 NOTE — Telephone Encounter (Signed)
 Pt called and is aware of Dr. Dolphus leaving. He will reach out to PCP regarding future f/u's. AB

## 2023-11-07 ENCOUNTER — Encounter (HOSPITAL_COMMUNITY): Payer: Self-pay

## 2023-11-07 ENCOUNTER — Other Ambulatory Visit (HOSPITAL_COMMUNITY): Payer: Self-pay | Admitting: Radiology

## 2023-11-07 ENCOUNTER — Telehealth (HOSPITAL_COMMUNITY): Payer: Self-pay

## 2023-11-07 DIAGNOSIS — I720 Aneurysm of carotid artery: Secondary | ICD-10-CM

## 2023-11-07 DIAGNOSIS — H93A1 Pulsatile tinnitus, right ear: Secondary | ICD-10-CM

## 2023-11-07 DIAGNOSIS — H93A9 Pulsatile tinnitus, unspecified ear: Secondary | ICD-10-CM

## 2023-11-07 DIAGNOSIS — I771 Stricture of artery: Secondary | ICD-10-CM

## 2023-11-07 NOTE — Telephone Encounter (Signed)
 Returned pt's call, no answer, left vm. AB

## 2023-12-21 ENCOUNTER — Encounter: Admitting: Nurse Practitioner

## 2023-12-26 ENCOUNTER — Encounter: Payer: Self-pay | Admitting: Physician Assistant

## 2023-12-26 ENCOUNTER — Ambulatory Visit (INDEPENDENT_AMBULATORY_CARE_PROVIDER_SITE_OTHER): Admitting: Physician Assistant

## 2023-12-26 VITALS — BP 114/72 | HR 73 | Resp 16 | Ht 72.0 in | Wt 168.1 lb

## 2023-12-26 DIAGNOSIS — Z0000 Encounter for general adult medical examination without abnormal findings: Secondary | ICD-10-CM

## 2023-12-26 NOTE — Progress Notes (Signed)
 Complete physical exam  Patient: Daniel Burton   DOB: 05/28/1960   63 y.o. Male  MRN: [id number]  Subjective:    Chief Complaint  Patient presents with   Annual Exam    CPE     Daniel Burton is a 63 y.o. male who presents today for a complete physical exam. He reports consuming a general diet. The patient has a physically strenuous job, but has no regular exercise apart from work.  He generally feels well. He reports sleeping well. He does not have additional problems to discuss today.    Most recent fall risk assessment:    12/26/2023    4:31 PM  Fall Risk   Falls in the past year? 0  Number falls in past yr: 0  Injury with Fall? 0  Follow up Falls evaluation completed     Most recent depression screenings:    12/26/2023    4:29 PM 09/07/2023    2:59 PM  PHQ 2/9 Scores  PHQ - 2 Score 0 0  PHQ- 9 Score 0 0    Vision:Within last year and Dental: No current dental problems and Receives regular dental care  Patient Care Team: Alphonsa Glendia LABOR, MD as PCP - General (Family Medicine) Mallipeddi, Diannah SQUIBB, MD as PCP - Cardiology (Cardiology)   Outpatient Medications Prior to Visit  Medication Sig   aspirin  EC 81 MG tablet Take 1 tablet (81 mg total) by mouth daily. Swallow whole.   metFORMIN  (GLUCOPHAGE -XR) 500 MG 24 hr tablet Take 2 tablets (1,000 mg total) by mouth daily with breakfast.   methocarbamol  (ROBAXIN ) 500 MG tablet Take 1 tablet (500 mg total) by mouth every 8 (eight) hours as needed for muscle spasms.   naproxen  (NAPROSYN ) 375 MG tablet Take 1 tablet (375 mg total) by mouth 2 (two) times daily with a meal.   rosuvastatin  (CRESTOR ) 40 MG tablet One each evening   predniSONE  (STERAPRED UNI-PAK 48 TAB) 10 MG (48) TBPK tablet Take as directed on pack / 12 day taper   Facility-Administered Medications Prior to Visit  Medication Dose Route Frequency Provider   glipiZIDE  (GLUCOTROL ) tablet 2.5 mg  2.5 mg Oral QAC breakfast Luking, Glendia LABOR, MD    Review of  Systems  Constitutional:  Negative for chills, fever and malaise/fatigue.  HENT:  Positive for tinnitus. Negative for hearing loss.   Eyes:  Negative for blurred vision and double vision.  Respiratory:  Negative for cough and shortness of breath.   Cardiovascular:  Negative for chest pain and palpitations.  Musculoskeletal:  Negative for joint pain and myalgias.  Neurological:  Positive for headaches. Negative for dizziness.  Psychiatric/Behavioral:  Negative for depression. The patient is not nervous/anxious.          Objective:     BP 114/72   Pulse 73   Resp 16   Ht 6' (1.829 m)   Wt 168 lb 1.9 oz (76.3 kg)   SpO2 95%   BMI 22.80 kg/m   Physical Exam Constitutional:      General: He is not in acute distress.    Appearance: Normal appearance. He is normal weight. He is not ill-appearing.  HENT:     Head: Normocephalic and atraumatic.     Mouth/Throat:     Mouth: Mucous membranes are moist.     Pharynx: Oropharynx is clear.  Eyes:     Extraocular Movements: Extraocular movements intact.     Conjunctiva/sclera: Conjunctivae normal.  Cardiovascular:  Rate and Rhythm: Normal rate and regular rhythm.     Heart sounds: Normal heart sounds. No murmur heard. Pulmonary:     Effort: Pulmonary effort is normal.     Breath sounds: No wheezing, rhonchi or rales.  Musculoskeletal:     Right lower leg: No edema.     Left lower leg: No edema.  Skin:    General: Skin is warm and dry.  Neurological:     General: No focal deficit present.     Mental Status: He is alert and oriented to person, place, and time.     Cranial Nerves: No cranial nerve deficit.  Psychiatric:        Mood and Affect: Mood normal.        Behavior: Behavior normal.     No results found for any visits on 12/26/23.    Assessment & Plan:    Routine Health Maintenance and Physical Exam  Health Maintenance  Topic Date Due   HIV Screening  Never done   Hepatitis C Screening  Never done    DTaP/Tdap/Td vaccine (1 - Tdap) Never done   Pneumococcal Vaccine for age over 37 (1 of 2 - PCV) Never done   Zoster (Shingles) Vaccine (1 of 2) Never done   COVID-19 Vaccine (3 - Pfizer risk series) 12/02/2019   Flu Shot  06/24/2024*   Colon Cancer Screening  02/08/2025*   Hemoglobin A1C  03/15/2024   Complete foot exam   04/09/2024   Eye exam for diabetics  07/01/2024   Yearly kidney function blood test for diabetes  09/13/2024   Yearly kidney health urinalysis for diabetes  09/13/2024   Hepatitis B Vaccine  Aged Out   HPV Vaccine  Aged Out   Meningitis B Vaccine  Aged Out  *Topic was postponed. The date shown is not the original due date.    Discussed health benefits of physical activity, and encouraged him to engage in regular exercise appropriate for his age and condition.  Problem List Items Addressed This Visit   None Visit Diagnoses       Annual visit for general adult medical examination without abnormal findings    -  Primary      Return in about 6 weeks (around 02/06/2024) for with Dr. Glendia.  Safety measures discussed.  Immunizations reviewed: defers vaccines today  Diet and exercise/ lifestyle modifications discussed: Recommend 150 minutes per week of exercise such as walking. Recommend lots of fresh produce to include fruits, vegetables, beans, healthy fats such as avocado, nuts, seeds, and 3-6 ounces of protein at each meal.  Avoid fried foods and fast food. Limit alcohol consumption: no more than one drink per day for women and 2 drinks per day for men.  Stress management discussed. Routine vision and dental screening discussed: recommend dentist every 6 months, gets vision checked every 1-2 years.  Health maintenance: up to date on colonoscopy due in 11/26, PSA done in June Questions answered.       Charmaine Leomia Blake, PA-C

## 2023-12-27 DIAGNOSIS — I671 Cerebral aneurysm, nonruptured: Secondary | ICD-10-CM | POA: Diagnosis not present

## 2023-12-27 DIAGNOSIS — H93A9 Pulsatile tinnitus, unspecified ear: Secondary | ICD-10-CM | POA: Diagnosis not present

## 2023-12-28 DIAGNOSIS — T82856A Stenosis of peripheral vascular stent, initial encounter: Secondary | ICD-10-CM | POA: Diagnosis not present

## 2023-12-28 DIAGNOSIS — I671 Cerebral aneurysm, nonruptured: Secondary | ICD-10-CM | POA: Diagnosis not present

## 2023-12-28 DIAGNOSIS — R9082 White matter disease, unspecified: Secondary | ICD-10-CM | POA: Diagnosis not present

## 2023-12-28 DIAGNOSIS — H93A1 Pulsatile tinnitus, right ear: Secondary | ICD-10-CM | POA: Diagnosis not present

## 2023-12-28 DIAGNOSIS — H93A9 Pulsatile tinnitus, unspecified ear: Secondary | ICD-10-CM | POA: Diagnosis not present

## 2024-01-03 DIAGNOSIS — G932 Benign intracranial hypertension: Secondary | ICD-10-CM | POA: Diagnosis not present

## 2024-02-08 ENCOUNTER — Encounter: Payer: Self-pay | Admitting: Family Medicine

## 2024-02-08 ENCOUNTER — Ambulatory Visit: Payer: Self-pay | Admitting: Family Medicine

## 2024-02-08 VITALS — BP 106/72 | HR 72 | Temp 97.9°F | Ht 72.0 in | Wt 160.0 lb

## 2024-02-08 DIAGNOSIS — E119 Type 2 diabetes mellitus without complications: Secondary | ICD-10-CM

## 2024-02-08 DIAGNOSIS — E1169 Type 2 diabetes mellitus with other specified complication: Secondary | ICD-10-CM

## 2024-02-08 DIAGNOSIS — E785 Hyperlipidemia, unspecified: Secondary | ICD-10-CM

## 2024-02-08 NOTE — Progress Notes (Signed)
   Subjective:    Patient ID: Daniel Burton, male    DOB: 1960-08-14, 63 y.o.   MRN: [id number]  HPI  6 month follow up  -Very nice patient with diabetes Unfortunately I do not think his diabetes under good control He does state he is willing to do lab work He is trying not monitoring his health take his medicines and try to eat healthy he stays physically active denies any chest tightness pressure pain or shortness of breath no rectal bleeding urination doing well Review of Systems     Objective:   Physical Exam General-in no acute distress Eyes-no discharge Lungs-respiratory rate normal, CTA CV-no murmurs,RRR Extremities skin warm dry no edema Neuro grossly normal Behavior normal, alert        Assessment & Plan:  1. Type 2 diabetes mellitus without complication, without long-term current use of insulin  (HCC) (Primary) Previously we ordered labs he will get these done in the near future we will see how his A1c is doing we may have to add some medicines  2. Hyperlipidemia associated with type 2 diabetes mellitus (HCC) Continue healthy diet check lipid profile continue rosuvastatin   Follow-up within 6 months

## 2024-02-28 ENCOUNTER — Telehealth: Payer: Self-pay

## 2024-02-28 NOTE — Telephone Encounter (Signed)
 Patient was identified as falling into the [place] Measure - Diabetes.   Patient was: Left voicemail to schedule with primary care provider. He may also schedule a nurse visit or lab visit for A1C before end of year.

## 2024-03-06 ENCOUNTER — Telehealth: Payer: Self-pay

## 2024-03-06 NOTE — Telephone Encounter (Signed)
 Patient was identified as falling into the [place] Measure - Diabetes.   Patient was: Appointment already scheduled for:  08/07/2024. Provider did not want to have labs drawn at appt in November. He wants to wait for 6 months.

## 2024-03-10 ENCOUNTER — Telehealth: Payer: Self-pay

## 2024-03-10 NOTE — Telephone Encounter (Signed)
 Patient was identified as falling into the [place] Measure - Diabetes.   Patient was: Requires a call back at a later time.Patient was not in good control so A1C was not checked in November will check again in 6 months per provider.

## 2024-04-03 ENCOUNTER — Encounter: Payer: Self-pay | Admitting: Orthopedic Surgery

## 2024-04-03 ENCOUNTER — Ambulatory Visit: Payer: Self-pay | Admitting: Orthopedic Surgery

## 2024-04-03 VITALS — BP 106/72 | Ht 72.0 in | Wt 160.0 lb

## 2024-04-03 DIAGNOSIS — M65312 Trigger thumb, left thumb: Secondary | ICD-10-CM

## 2024-04-03 MED ORDER — METHYLPREDNISOLONE ACETATE 40 MG/ML IJ SUSP
40.0000 mg | Freq: Once | INTRAMUSCULAR | Status: AC
  Administered 2024-04-03: 40 mg via INTRA_ARTICULAR

## 2024-04-03 NOTE — Progress Notes (Signed)
" ° °  Chief Complaint  Patient presents with   Hand Problem    Left thumb catches/ locks in am    Daniel Burton is 64 years old he complains of pain over the A1 pulley of his left thumb associated with catching and locking for several months.  He has to manipulate the thumb into extension.    PHYSICAL EXAM:   A focused exam was done on the left thumb.  He has tenderness over the A1 pulley painful clicking no locking in the office neurovascular exam intact  Assessment and Plan:   Encounter Diagnosis  Name Primary?   Trigger finger of left thumb Yes    Assessment and plan presented with options of surgery versus injection he opted for injection  Left Trigger thumb injection Medication  1 mL of 40 mg Depo-Medrol   2 mL of 1% lidocaine  plain  Ethyl chloride for anesthesia  Verbal consent was obtained timeout was taken to confirm the injection site as left thumb  Alcohol was used to prepare the skin along with ethyl chloride and then the injection was made at the A1 pulley there were no complications    "

## 2024-04-03 NOTE — Progress Notes (Signed)
" °  Intake history:  Chief Complaint  Patient presents with   Hand Problem    Left thumb catches/ locks in am     BP 106/72 Comment: 02/08/24  Ht 6' (1.829 m)   Wt 160 lb (72.6 kg)   BMI 21.70 kg/m  Body mass index is 21.7 kg/m.  [facility]? [facility] [address]  WHAT ARE WE SEEING YOU FOR TODAY?   Left thumb triggering  How long has this bothered you? (DOI?DOS?WS?)    Was there an injury? No  Anticoag.  No   Any ALLERGIES _________.Allergies[1]  _____________________________________   Treatment:  Have you taken:  Tylenol  No  Advil No  Had PT No  Had injection No  Other  _________________________            [1] No Known Allergies  "

## 2024-08-07 ENCOUNTER — Ambulatory Visit: Payer: Self-pay | Admitting: Family Medicine
